# Patient Record
Sex: Female | Born: 1935 | Race: White | Hispanic: No | Smoking: Never smoker
Health system: Southern US, Community
[De-identification: ages and names within clinical notes are randomized; demographics above are authoritative.]

## PROBLEM LIST (undated history)

## (undated) DIAGNOSIS — D509 Iron deficiency anemia, unspecified: Secondary | ICD-10-CM

## (undated) DIAGNOSIS — H409 Unspecified glaucoma: Secondary | ICD-10-CM

## (undated) DIAGNOSIS — K5730 Diverticulosis of large intestine without perforation or abscess without bleeding: Secondary | ICD-10-CM

## (undated) DIAGNOSIS — N816 Rectocele: Secondary | ICD-10-CM

## (undated) DIAGNOSIS — C9110 Chronic lymphocytic leukemia of B-cell type not having achieved remission: Secondary | ICD-10-CM

## (undated) DIAGNOSIS — C50919 Malignant neoplasm of unspecified site of unspecified female breast: Secondary | ICD-10-CM

## (undated) DIAGNOSIS — K219 Gastro-esophageal reflux disease without esophagitis: Secondary | ICD-10-CM

## (undated) DIAGNOSIS — Z853 Personal history of malignant neoplasm of breast: Secondary | ICD-10-CM

## (undated) DIAGNOSIS — E785 Hyperlipidemia, unspecified: Secondary | ICD-10-CM

## (undated) DIAGNOSIS — F329 Major depressive disorder, single episode, unspecified: Secondary | ICD-10-CM

## (undated) DIAGNOSIS — N318 Other neuromuscular dysfunction of bladder: Secondary | ICD-10-CM

## (undated) DIAGNOSIS — G4733 Obstructive sleep apnea (adult) (pediatric): Secondary | ICD-10-CM

## (undated) DIAGNOSIS — J479 Bronchiectasis, uncomplicated: Secondary | ICD-10-CM

## (undated) DIAGNOSIS — F0390 Unspecified dementia without behavioral disturbance: Secondary | ICD-10-CM

## (undated) DIAGNOSIS — N390 Urinary tract infection, site not specified: Secondary | ICD-10-CM

## (undated) DIAGNOSIS — M810 Age-related osteoporosis without current pathological fracture: Secondary | ICD-10-CM

## (undated) HISTORY — DX: Personal history of malignant neoplasm of breast: Z85.3

## (undated) HISTORY — DX: Urinary tract infection, site not specified: N39.0

## (undated) HISTORY — DX: Malignant neoplasm of unspecified site of unspecified female breast: C50.919

## (undated) HISTORY — PX: OTHER SURGICAL HISTORY: SHX169

## (undated) HISTORY — DX: Chronic lymphocytic leukemia of B-cell type not having achieved remission: C91.10

## (undated) HISTORY — DX: Hyperlipidemia, unspecified: E78.5

## (undated) HISTORY — DX: Diverticulosis of large intestine without perforation or abscess without bleeding: K57.30

## (undated) HISTORY — PX: ABDOMINAL HYSTERECTOMY: SHX81

## (undated) HISTORY — DX: Unspecified dementia without behavioral disturbance: F03.90

## (undated) HISTORY — DX: Rectocele: N81.6

## (undated) HISTORY — PX: BREAST SURGERY: SHX581

## (undated) HISTORY — DX: Iron deficiency anemia, unspecified: D50.9

## (undated) HISTORY — DX: Major depressive disorder, single episode, unspecified: F32.9

## (undated) HISTORY — DX: Unspecified glaucoma: H40.9

## (undated) HISTORY — DX: Gastro-esophageal reflux disease without esophagitis: K21.9

## (undated) HISTORY — DX: Age-related osteoporosis without current pathological fracture: M81.0

## (undated) HISTORY — DX: Bronchiectasis, uncomplicated: J47.9

## (undated) HISTORY — DX: Other neuromuscular dysfunction of bladder: N31.8

## (undated) HISTORY — DX: Obstructive sleep apnea (adult) (pediatric): G47.33

---

## 1997-09-03 ENCOUNTER — Ambulatory Visit (HOSPITAL_COMMUNITY): Admission: RE | Admit: 1997-09-03 | Discharge: 1997-09-03 | Payer: Self-pay | Admitting: Oncology

## 1997-09-26 ENCOUNTER — Ambulatory Visit (HOSPITAL_COMMUNITY): Admission: RE | Admit: 1997-09-26 | Discharge: 1997-09-26 | Payer: Self-pay | Admitting: Oncology

## 1997-10-23 ENCOUNTER — Ambulatory Visit (HOSPITAL_COMMUNITY): Admission: RE | Admit: 1997-10-23 | Discharge: 1997-10-23 | Payer: Self-pay | Admitting: Oncology

## 1998-09-22 ENCOUNTER — Ambulatory Visit (HOSPITAL_COMMUNITY): Admission: RE | Admit: 1998-09-22 | Discharge: 1998-09-22 | Payer: Self-pay | Admitting: Oncology

## 1998-09-22 ENCOUNTER — Encounter: Payer: Self-pay | Admitting: Oncology

## 1998-11-03 ENCOUNTER — Encounter: Payer: Self-pay | Admitting: Oncology

## 1998-11-03 ENCOUNTER — Ambulatory Visit (HOSPITAL_COMMUNITY): Admission: RE | Admit: 1998-11-03 | Discharge: 1998-11-03 | Payer: Self-pay | Admitting: Oncology

## 1998-12-24 ENCOUNTER — Ambulatory Visit (HOSPITAL_COMMUNITY): Admission: RE | Admit: 1998-12-24 | Discharge: 1998-12-24 | Payer: Self-pay | Admitting: Oncology

## 1999-07-08 ENCOUNTER — Encounter: Admission: RE | Admit: 1999-07-08 | Discharge: 1999-07-08 | Payer: Self-pay

## 1999-07-26 ENCOUNTER — Encounter: Admission: RE | Admit: 1999-07-26 | Discharge: 1999-07-26 | Payer: Self-pay | Admitting: Oncology

## 1999-07-26 ENCOUNTER — Encounter: Payer: Self-pay | Admitting: Oncology

## 1999-09-01 ENCOUNTER — Encounter: Payer: Self-pay | Admitting: Oncology

## 1999-09-01 ENCOUNTER — Ambulatory Visit (HOSPITAL_COMMUNITY): Admission: RE | Admit: 1999-09-01 | Discharge: 1999-09-01 | Payer: Self-pay | Admitting: Oncology

## 1999-11-16 ENCOUNTER — Encounter: Admission: RE | Admit: 1999-11-16 | Discharge: 1999-11-16 | Payer: Self-pay | Admitting: Oncology

## 1999-11-16 ENCOUNTER — Encounter: Payer: Self-pay | Admitting: Oncology

## 1999-12-09 ENCOUNTER — Ambulatory Visit (HOSPITAL_COMMUNITY): Admission: RE | Admit: 1999-12-09 | Discharge: 1999-12-09 | Payer: Self-pay | Admitting: Oncology

## 1999-12-09 ENCOUNTER — Encounter (INDEPENDENT_AMBULATORY_CARE_PROVIDER_SITE_OTHER): Payer: Self-pay | Admitting: Specialist

## 2000-09-05 ENCOUNTER — Encounter: Admission: RE | Admit: 2000-09-05 | Discharge: 2000-09-05 | Payer: Self-pay | Admitting: Oncology

## 2000-09-05 ENCOUNTER — Encounter: Payer: Self-pay | Admitting: Oncology

## 2000-12-19 ENCOUNTER — Encounter: Admission: RE | Admit: 2000-12-19 | Discharge: 2000-12-19 | Payer: Self-pay | Admitting: Oncology

## 2000-12-19 ENCOUNTER — Encounter: Payer: Self-pay | Admitting: Oncology

## 2000-12-21 ENCOUNTER — Ambulatory Visit (HOSPITAL_COMMUNITY): Admission: RE | Admit: 2000-12-21 | Discharge: 2000-12-21 | Payer: Self-pay | Admitting: Oncology

## 2000-12-21 ENCOUNTER — Encounter (INDEPENDENT_AMBULATORY_CARE_PROVIDER_SITE_OTHER): Payer: Self-pay | Admitting: Specialist

## 2001-05-31 ENCOUNTER — Encounter: Payer: Self-pay | Admitting: Internal Medicine

## 2001-09-10 ENCOUNTER — Encounter: Admission: RE | Admit: 2001-09-10 | Discharge: 2001-09-10 | Payer: Self-pay | Admitting: Oncology

## 2001-09-10 ENCOUNTER — Encounter: Payer: Self-pay | Admitting: Oncology

## 2001-12-27 ENCOUNTER — Encounter: Payer: Self-pay | Admitting: Oncology

## 2001-12-27 ENCOUNTER — Encounter: Admission: RE | Admit: 2001-12-27 | Discharge: 2001-12-27 | Payer: Self-pay | Admitting: Oncology

## 2001-12-31 ENCOUNTER — Ambulatory Visit (HOSPITAL_COMMUNITY): Admission: RE | Admit: 2001-12-31 | Discharge: 2001-12-31 | Payer: Self-pay | Admitting: Oncology

## 2001-12-31 ENCOUNTER — Encounter: Payer: Self-pay | Admitting: Oncology

## 2002-01-03 ENCOUNTER — Encounter (INDEPENDENT_AMBULATORY_CARE_PROVIDER_SITE_OTHER): Payer: Self-pay | Admitting: Specialist

## 2002-01-03 ENCOUNTER — Ambulatory Visit (HOSPITAL_COMMUNITY): Admission: RE | Admit: 2002-01-03 | Discharge: 2002-01-03 | Payer: Self-pay | Admitting: Oncology

## 2002-09-16 ENCOUNTER — Encounter: Admission: RE | Admit: 2002-09-16 | Discharge: 2002-09-16 | Payer: Self-pay | Admitting: Oncology

## 2002-09-16 ENCOUNTER — Encounter: Payer: Self-pay | Admitting: Oncology

## 2002-12-31 ENCOUNTER — Encounter: Admission: RE | Admit: 2002-12-31 | Discharge: 2002-12-31 | Payer: Self-pay | Admitting: Oncology

## 2002-12-31 ENCOUNTER — Encounter: Payer: Self-pay | Admitting: Oncology

## 2003-09-29 ENCOUNTER — Encounter: Admission: RE | Admit: 2003-09-29 | Discharge: 2003-09-29 | Payer: Self-pay | Admitting: Oncology

## 2003-12-30 ENCOUNTER — Ambulatory Visit (HOSPITAL_COMMUNITY): Admission: RE | Admit: 2003-12-30 | Discharge: 2003-12-30 | Payer: Self-pay | Admitting: Oncology

## 2004-01-31 ENCOUNTER — Emergency Department (HOSPITAL_COMMUNITY): Admission: EM | Admit: 2004-01-31 | Discharge: 2004-01-31 | Payer: Self-pay | Admitting: Emergency Medicine

## 2004-04-08 ENCOUNTER — Ambulatory Visit: Payer: Self-pay | Admitting: Internal Medicine

## 2004-05-31 ENCOUNTER — Ambulatory Visit: Payer: Self-pay | Admitting: Family Medicine

## 2004-06-04 ENCOUNTER — Ambulatory Visit: Payer: Self-pay | Admitting: Internal Medicine

## 2004-06-09 ENCOUNTER — Encounter: Admission: RE | Admit: 2004-06-09 | Discharge: 2004-06-22 | Payer: Self-pay | Admitting: Orthopaedic Surgery

## 2004-06-25 ENCOUNTER — Ambulatory Visit: Payer: Self-pay | Admitting: Internal Medicine

## 2004-06-28 ENCOUNTER — Ambulatory Visit: Payer: Self-pay | Admitting: Oncology

## 2004-07-01 ENCOUNTER — Ambulatory Visit: Payer: Self-pay | Admitting: Licensed Clinical Social Worker

## 2004-07-09 ENCOUNTER — Ambulatory Visit: Payer: Self-pay | Admitting: Licensed Clinical Social Worker

## 2004-09-29 ENCOUNTER — Encounter: Admission: RE | Admit: 2004-09-29 | Discharge: 2004-09-29 | Payer: Self-pay | Admitting: Oncology

## 2004-10-21 ENCOUNTER — Ambulatory Visit: Payer: Self-pay | Admitting: Internal Medicine

## 2004-10-29 ENCOUNTER — Ambulatory Visit: Payer: Self-pay | Admitting: Internal Medicine

## 2004-12-27 ENCOUNTER — Ambulatory Visit: Payer: Self-pay | Admitting: Oncology

## 2005-03-09 ENCOUNTER — Ambulatory Visit: Payer: Self-pay | Admitting: Internal Medicine

## 2005-03-09 ENCOUNTER — Inpatient Hospital Stay (HOSPITAL_COMMUNITY): Admission: AD | Admit: 2005-03-09 | Discharge: 2005-03-11 | Payer: Self-pay | Admitting: Internal Medicine

## 2005-03-09 ENCOUNTER — Ambulatory Visit: Payer: Self-pay | Admitting: Gastroenterology

## 2005-03-10 ENCOUNTER — Ambulatory Visit: Payer: Self-pay | Admitting: Internal Medicine

## 2005-03-11 ENCOUNTER — Encounter: Payer: Self-pay | Admitting: Gastroenterology

## 2005-03-14 ENCOUNTER — Encounter: Payer: Self-pay | Admitting: Gastroenterology

## 2005-03-14 ENCOUNTER — Ambulatory Visit (HOSPITAL_COMMUNITY): Admission: RE | Admit: 2005-03-14 | Discharge: 2005-03-14 | Payer: Self-pay | Admitting: Internal Medicine

## 2005-03-17 ENCOUNTER — Ambulatory Visit: Payer: Self-pay | Admitting: Internal Medicine

## 2005-03-28 ENCOUNTER — Ambulatory Visit: Payer: Self-pay | Admitting: Internal Medicine

## 2005-04-25 ENCOUNTER — Encounter: Admission: RE | Admit: 2005-04-25 | Discharge: 2005-04-25 | Payer: Self-pay | Admitting: Orthopaedic Surgery

## 2005-04-26 ENCOUNTER — Ambulatory Visit (HOSPITAL_COMMUNITY): Admission: RE | Admit: 2005-04-26 | Discharge: 2005-04-26 | Payer: Self-pay | Admitting: Orthopaedic Surgery

## 2005-04-26 ENCOUNTER — Ambulatory Visit (HOSPITAL_BASED_OUTPATIENT_CLINIC_OR_DEPARTMENT_OTHER): Admission: RE | Admit: 2005-04-26 | Discharge: 2005-04-26 | Payer: Self-pay | Admitting: Orthopaedic Surgery

## 2005-06-28 ENCOUNTER — Ambulatory Visit: Payer: Self-pay | Admitting: Oncology

## 2005-09-20 ENCOUNTER — Ambulatory Visit: Payer: Self-pay | Admitting: Internal Medicine

## 2005-09-26 ENCOUNTER — Encounter: Admission: RE | Admit: 2005-09-26 | Discharge: 2005-09-26 | Payer: Self-pay | Admitting: Internal Medicine

## 2005-10-07 ENCOUNTER — Encounter: Admission: RE | Admit: 2005-10-07 | Discharge: 2005-10-07 | Payer: Self-pay | Admitting: Oncology

## 2005-10-07 ENCOUNTER — Encounter (INDEPENDENT_AMBULATORY_CARE_PROVIDER_SITE_OTHER): Payer: Self-pay | Admitting: Specialist

## 2005-10-14 ENCOUNTER — Ambulatory Visit: Payer: Self-pay | Admitting: Oncology

## 2005-10-14 LAB — CBC WITH DIFFERENTIAL/PLATELET
BASO%: 0.7 % (ref 0.0–2.0)
HCT: 38 % (ref 34.8–46.6)
MCHC: 33.9 g/dL (ref 32.0–36.0)
MONO#: 0.6 10*3/uL (ref 0.1–0.9)
NEUT%: 40.1 % (ref 39.6–76.8)
RBC: 3.81 10*6/uL (ref 3.70–5.32)
WBC: 7.8 10*3/uL (ref 3.9–10.0)
lymph#: 3.9 10*3/uL — ABNORMAL HIGH (ref 0.9–3.3)

## 2005-10-14 LAB — BASIC METABOLIC PANEL
CO2: 29 mEq/L (ref 19–32)
Chloride: 101 mEq/L (ref 96–112)
Sodium: 138 mEq/L (ref 135–145)

## 2005-10-17 ENCOUNTER — Ambulatory Visit (HOSPITAL_COMMUNITY): Admission: RE | Admit: 2005-10-17 | Discharge: 2005-10-17 | Payer: Self-pay | Admitting: Oncology

## 2005-11-25 ENCOUNTER — Ambulatory Visit: Payer: Self-pay | Admitting: Internal Medicine

## 2005-12-14 ENCOUNTER — Ambulatory Visit: Payer: Self-pay | Admitting: Internal Medicine

## 2005-12-27 ENCOUNTER — Ambulatory Visit: Payer: Self-pay | Admitting: Oncology

## 2005-12-28 LAB — MORPHOLOGY

## 2005-12-28 LAB — CBC WITH DIFFERENTIAL/PLATELET
BASO%: 0.4 % (ref 0.0–2.0)
Basophils Absolute: 0 10*3/uL (ref 0.0–0.1)
EOS%: 1.2 % (ref 0.0–7.0)
HGB: 11.7 g/dL (ref 11.6–15.9)
MCH: 34.2 pg — ABNORMAL HIGH (ref 26.0–34.0)
MCHC: 34.7 g/dL (ref 32.0–36.0)
MCV: 98.7 fL (ref 81.0–101.0)
MONO%: 7.4 % (ref 0.0–13.0)
NEUT%: 42.3 % (ref 39.6–76.8)
RDW: 14.9 % — ABNORMAL HIGH (ref 11.3–14.5)
lymph#: 2.6 10*3/uL (ref 0.9–3.3)

## 2005-12-28 LAB — COMPREHENSIVE METABOLIC PANEL
BUN: 23 mg/dL (ref 6–23)
CO2: 30 mEq/L (ref 19–32)
Glucose, Bld: 96 mg/dL (ref 70–99)
Sodium: 140 mEq/L (ref 135–145)
Total Bilirubin: 0.5 mg/dL (ref 0.3–1.2)
Total Protein: 5.7 g/dL — ABNORMAL LOW (ref 6.0–8.3)

## 2005-12-28 LAB — LACTATE DEHYDROGENASE: LDH: 198 U/L (ref 94–250)

## 2006-02-22 ENCOUNTER — Ambulatory Visit: Payer: Self-pay | Admitting: Internal Medicine

## 2006-04-07 ENCOUNTER — Ambulatory Visit: Payer: Self-pay | Admitting: Oncology

## 2006-04-11 ENCOUNTER — Ambulatory Visit: Payer: Self-pay | Admitting: Internal Medicine

## 2006-04-11 LAB — CBC WITH DIFFERENTIAL/PLATELET
BASO%: 0.3 % (ref 0.0–2.0)
Eosinophils Absolute: 0.1 10*3/uL (ref 0.0–0.5)
LYMPH%: 60 % — ABNORMAL HIGH (ref 14.0–48.0)
MCHC: 34.3 g/dL (ref 32.0–36.0)
MONO#: 0.4 10*3/uL (ref 0.1–0.9)
NEUT#: 2.5 10*3/uL (ref 1.5–6.5)
RBC: 3.62 10*6/uL — ABNORMAL LOW (ref 3.70–5.32)
RDW: 15.2 % — ABNORMAL HIGH (ref 11.3–14.5)
WBC: 7.6 10*3/uL (ref 3.9–10.0)
lymph#: 4.6 10*3/uL — ABNORMAL HIGH (ref 0.9–3.3)

## 2006-04-11 LAB — COMPREHENSIVE METABOLIC PANEL
ALT: 15 U/L (ref 0–35)
AST: 22 U/L (ref 0–37)
CO2: 31 mEq/L (ref 19–32)
Calcium: 9 mg/dL (ref 8.4–10.5)
Chloride: 100 mEq/L (ref 96–112)
Creatinine, Ser: 0.68 mg/dL (ref 0.40–1.20)
Potassium: 4.2 mEq/L (ref 3.5–5.3)
Sodium: 135 mEq/L (ref 135–145)
Total Protein: 6 g/dL (ref 6.0–8.3)

## 2006-04-11 LAB — MORPHOLOGY: PLT EST: ADEQUATE

## 2006-04-11 LAB — LACTATE DEHYDROGENASE: LDH: 202 U/L (ref 94–250)

## 2006-04-13 ENCOUNTER — Ambulatory Visit (HOSPITAL_COMMUNITY): Admission: RE | Admit: 2006-04-13 | Discharge: 2006-04-13 | Payer: Self-pay | Admitting: Oncology

## 2006-05-26 ENCOUNTER — Ambulatory Visit: Payer: Self-pay | Admitting: Internal Medicine

## 2006-06-26 ENCOUNTER — Ambulatory Visit: Payer: Self-pay | Admitting: Internal Medicine

## 2006-07-17 ENCOUNTER — Ambulatory Visit: Payer: Self-pay | Admitting: Internal Medicine

## 2006-07-17 LAB — CONVERTED CEMR LAB
AST: 29 units/L (ref 0–37)
Basophils Relative: 0.5 % (ref 0.0–1.0)
Chloride: 102 meq/L (ref 96–112)
Cholesterol: 206 mg/dL (ref 0–200)
Creatinine, Ser: 0.7 mg/dL (ref 0.4–1.2)
Direct LDL: 127.9 mg/dL
Eosinophils Absolute: 0.1 10*3/uL (ref 0.0–0.6)
Folate: 14 ng/mL
GFR calc non Af Amer: 85 mL/min
Glucose, Bld: 104 mg/dL — ABNORMAL HIGH (ref 70–99)
HDL: 68.3 mg/dL (ref >39.0)
Hemoglobin: 12.9 g/dL (ref 12.0–15.0)
Lymphocytes Relative: 63.8 % — ABNORMAL HIGH (ref 12.0–46.0)
MCV: 96.9 fL (ref 78.0–100.0)
Monocytes Absolute: 0.5 10*3/uL (ref 0.2–0.7)
Monocytes Relative: 6.8 % (ref 3.0–11.0)
Neutro Abs: 2 10*3/uL (ref 1.4–7.7)
Platelets: 145 10*3/uL — ABNORMAL LOW (ref 150–400)
Potassium: 4.6 meq/L (ref 3.5–5.1)
Sodium: 141 meq/L (ref 135–145)
VLDL: 15 mg/dL (ref 0–40)
Vitamin B-12: 359 pg/mL (ref 211–911)

## 2006-08-02 ENCOUNTER — Ambulatory Visit: Payer: Self-pay | Admitting: Internal Medicine

## 2006-09-05 ENCOUNTER — Ambulatory Visit: Payer: Self-pay | Admitting: Internal Medicine

## 2006-09-05 LAB — CONVERTED CEMR LAB
Ketones, ur: NEGATIVE mg/dL
Specific Gravity, Urine: 1.01 (ref 1.000–1.03)
Urine Glucose: NEGATIVE mg/dL
Urobilinogen, UA: 0.2 (ref 0.0–1.0)
pH: 7.5 (ref 5.0–8.0)

## 2006-10-10 ENCOUNTER — Ambulatory Visit: Payer: Self-pay | Admitting: Internal Medicine

## 2006-10-10 ENCOUNTER — Encounter: Admission: RE | Admit: 2006-10-10 | Discharge: 2006-10-10 | Payer: Self-pay | Admitting: Oncology

## 2006-10-10 ENCOUNTER — Ambulatory Visit: Payer: Self-pay | Admitting: Oncology

## 2006-10-10 LAB — COMPREHENSIVE METABOLIC PANEL
ALT: 20 U/L (ref 0–35)
CO2: 30 mEq/L (ref 19–32)
Calcium: 8.8 mg/dL (ref 8.4–10.5)
Chloride: 101 mEq/L (ref 96–112)
Sodium: 139 mEq/L (ref 135–145)
Total Protein: 6 g/dL (ref 6.0–8.3)

## 2006-10-10 LAB — CBC WITH DIFFERENTIAL/PLATELET
Basophils Absolute: 0 10*3/uL (ref 0.0–0.1)
EOS%: 1.4 % (ref 0.0–7.0)
HCT: 35.4 % (ref 34.8–46.6)
HGB: 12.1 g/dL (ref 11.6–15.9)
MCH: 33.1 pg (ref 26.0–34.0)
MCV: 96.9 fL (ref 81.0–101.0)
MONO%: 6.5 % (ref 0.0–13.0)
NEUT%: 36.5 % — ABNORMAL LOW (ref 39.6–76.8)

## 2006-10-10 LAB — LACTATE DEHYDROGENASE: LDH: 195 U/L (ref 94–250)

## 2006-10-19 ENCOUNTER — Ambulatory Visit: Admission: RE | Admit: 2006-10-19 | Discharge: 2006-10-19 | Payer: Self-pay | Admitting: Oncology

## 2006-10-30 ENCOUNTER — Ambulatory Visit: Payer: Self-pay | Admitting: Internal Medicine

## 2006-12-13 ENCOUNTER — Ambulatory Visit: Payer: Self-pay | Admitting: Internal Medicine

## 2006-12-19 ENCOUNTER — Ambulatory Visit: Payer: Self-pay | Admitting: Internal Medicine

## 2007-01-19 ENCOUNTER — Ambulatory Visit: Payer: Self-pay | Admitting: Oncology

## 2007-01-22 LAB — CBC WITH DIFFERENTIAL/PLATELET
BASO%: 0.2 % (ref 0.0–2.0)
EOS%: 0.7 % (ref 0.0–7.0)
MCH: 33.9 pg (ref 26.0–34.0)
MCHC: 35.1 g/dL (ref 32.0–36.0)
RBC: 3.7 10*6/uL (ref 3.70–5.32)
RDW: 15.6 % — ABNORMAL HIGH (ref 11.3–14.5)
lymph#: 4.5 10*3/uL — ABNORMAL HIGH (ref 0.9–3.3)

## 2007-01-22 LAB — COMPREHENSIVE METABOLIC PANEL
AST: 25 U/L (ref 0–37)
BUN: 16 mg/dL (ref 6–23)
Calcium: 9.1 mg/dL (ref 8.4–10.5)
Chloride: 100 mEq/L (ref 96–112)
Creatinine, Ser: 0.63 mg/dL (ref 0.40–1.20)

## 2007-01-22 LAB — MORPHOLOGY

## 2007-01-22 LAB — LACTATE DEHYDROGENASE: LDH: 179 U/L (ref 94–250)

## 2007-04-09 ENCOUNTER — Telehealth (INDEPENDENT_AMBULATORY_CARE_PROVIDER_SITE_OTHER): Payer: Self-pay | Admitting: *Deleted

## 2007-04-09 ENCOUNTER — Telehealth: Payer: Self-pay | Admitting: Internal Medicine

## 2007-04-09 ENCOUNTER — Ambulatory Visit: Payer: Self-pay | Admitting: Internal Medicine

## 2007-04-09 DIAGNOSIS — N318 Other neuromuscular dysfunction of bladder: Secondary | ICD-10-CM

## 2007-04-09 DIAGNOSIS — H409 Unspecified glaucoma: Secondary | ICD-10-CM

## 2007-04-09 DIAGNOSIS — E785 Hyperlipidemia, unspecified: Secondary | ICD-10-CM

## 2007-04-09 DIAGNOSIS — N390 Urinary tract infection, site not specified: Secondary | ICD-10-CM

## 2007-04-09 DIAGNOSIS — D509 Iron deficiency anemia, unspecified: Secondary | ICD-10-CM

## 2007-04-09 DIAGNOSIS — M25519 Pain in unspecified shoulder: Secondary | ICD-10-CM

## 2007-04-09 DIAGNOSIS — Z8719 Personal history of other diseases of the digestive system: Secondary | ICD-10-CM | POA: Insufficient documentation

## 2007-04-09 HISTORY — DX: Other neuromuscular dysfunction of bladder: N31.8

## 2007-04-09 HISTORY — DX: Iron deficiency anemia, unspecified: D50.9

## 2007-04-09 HISTORY — DX: Unspecified glaucoma: H40.9

## 2007-04-09 HISTORY — DX: Hyperlipidemia, unspecified: E78.5

## 2007-04-09 LAB — CONVERTED CEMR LAB
Glucose, Urine, Semiquant: NEGATIVE
Protein, U semiquant: NEGATIVE
Specific Gravity, Urine: 1.015
pH: 6.5

## 2007-04-18 ENCOUNTER — Encounter: Payer: Self-pay | Admitting: Internal Medicine

## 2007-04-19 ENCOUNTER — Ambulatory Visit: Payer: Self-pay | Admitting: Oncology

## 2007-04-24 LAB — COMPREHENSIVE METABOLIC PANEL
Albumin: 4.1 g/dL (ref 3.5–5.2)
BUN: 24 mg/dL — ABNORMAL HIGH (ref 6–23)
CO2: 28 mEq/L (ref 19–32)
Calcium: 8.9 mg/dL (ref 8.4–10.5)
Chloride: 101 mEq/L (ref 96–112)
Glucose, Bld: 113 mg/dL — ABNORMAL HIGH (ref 70–99)
Potassium: 3.9 mEq/L (ref 3.5–5.3)

## 2007-04-24 LAB — CBC WITH DIFFERENTIAL/PLATELET
BASO%: 0.3 % (ref 0.0–2.0)
EOS%: 0.5 % (ref 0.0–7.0)
HCT: 36.7 % (ref 34.8–46.6)
LYMPH%: 69.1 % — ABNORMAL HIGH (ref 14.0–48.0)
MCH: 33.9 pg (ref 26.0–34.0)
MCHC: 34.3 g/dL (ref 32.0–36.0)
MONO%: 4.3 % (ref 0.0–13.0)
NEUT%: 25.8 % — ABNORMAL LOW (ref 39.6–76.8)
Platelets: 152 10*3/uL (ref 145–400)
lymph#: 9.4 10*3/uL — ABNORMAL HIGH (ref 0.9–3.3)

## 2007-05-08 ENCOUNTER — Ambulatory Visit (HOSPITAL_COMMUNITY): Admission: RE | Admit: 2007-05-08 | Discharge: 2007-05-08 | Payer: Self-pay | Admitting: Oncology

## 2007-05-08 ENCOUNTER — Encounter (INDEPENDENT_AMBULATORY_CARE_PROVIDER_SITE_OTHER): Payer: Self-pay | Admitting: *Deleted

## 2007-05-14 ENCOUNTER — Encounter: Payer: Self-pay | Admitting: Internal Medicine

## 2007-06-08 ENCOUNTER — Encounter: Payer: Self-pay | Admitting: Internal Medicine

## 2007-06-18 ENCOUNTER — Ambulatory Visit: Payer: Self-pay | Admitting: Internal Medicine

## 2007-06-22 ENCOUNTER — Telehealth: Payer: Self-pay | Admitting: Internal Medicine

## 2007-06-29 ENCOUNTER — Ambulatory Visit: Payer: Self-pay | Admitting: Internal Medicine

## 2007-06-29 DIAGNOSIS — M549 Dorsalgia, unspecified: Secondary | ICD-10-CM | POA: Insufficient documentation

## 2007-07-02 ENCOUNTER — Telehealth: Payer: Self-pay | Admitting: Internal Medicine

## 2007-07-10 ENCOUNTER — Ambulatory Visit: Payer: Self-pay | Admitting: Internal Medicine

## 2007-07-10 DIAGNOSIS — R059 Cough, unspecified: Secondary | ICD-10-CM | POA: Insufficient documentation

## 2007-07-10 DIAGNOSIS — R05 Cough: Secondary | ICD-10-CM

## 2007-07-20 ENCOUNTER — Ambulatory Visit: Payer: Self-pay | Admitting: Internal Medicine

## 2007-08-01 ENCOUNTER — Encounter: Payer: Self-pay | Admitting: Internal Medicine

## 2007-08-09 ENCOUNTER — Encounter: Payer: Self-pay | Admitting: Internal Medicine

## 2007-09-20 ENCOUNTER — Ambulatory Visit: Payer: Self-pay | Admitting: Internal Medicine

## 2007-09-20 DIAGNOSIS — R5383 Other fatigue: Secondary | ICD-10-CM

## 2007-09-20 DIAGNOSIS — J189 Pneumonia, unspecified organism: Secondary | ICD-10-CM

## 2007-09-20 DIAGNOSIS — R413 Other amnesia: Secondary | ICD-10-CM | POA: Insufficient documentation

## 2007-09-20 DIAGNOSIS — R5381 Other malaise: Secondary | ICD-10-CM

## 2007-09-22 ENCOUNTER — Encounter: Payer: Self-pay | Admitting: Internal Medicine

## 2007-09-22 DIAGNOSIS — D7289 Other specified disorders of white blood cells: Secondary | ICD-10-CM

## 2007-09-22 LAB — CONVERTED CEMR LAB
Basophils Relative: 0.5 % (ref 0.0–1.0)
CO2: 37 meq/L — ABNORMAL HIGH (ref 19–32)
Calcium: 9.6 mg/dL (ref 8.4–10.5)
Direct LDL: 116.5 mg/dL
Eosinophils Absolute: 0.1 10*3/uL (ref 0.0–0.7)
Eosinophils Relative: 1.2 % (ref 0.0–5.0)
Glucose, Bld: 104 mg/dL — ABNORMAL HIGH (ref 70–99)
HDL: 85.7 mg/dL (ref >39.0)
Hemoglobin: 12.5 g/dL (ref 12.0–15.0)
MCV: 98.9 fL (ref 78.0–100.0)
Neutrophils Relative %: 24.6 % — ABNORMAL LOW (ref 43.0–77.0)
Potassium: 4.5 meq/L (ref 3.5–5.1)
RBC: 3.77 M/uL — ABNORMAL LOW (ref 3.87–5.11)
Sodium: 140 meq/L (ref 135–145)
TSH: 4.51 microintl units/mL (ref 0.35–5.50)
WBC: 11.3 10*3/uL — ABNORMAL HIGH (ref 4.5–10.5)

## 2007-09-26 ENCOUNTER — Telehealth: Payer: Self-pay | Admitting: Internal Medicine

## 2007-09-28 ENCOUNTER — Ambulatory Visit: Payer: Self-pay | Admitting: Internal Medicine

## 2007-09-28 LAB — CONVERTED CEMR LAB
Basophils Relative: 0.1 % (ref 0.0–1.0)
Hemoglobin: 12.1 g/dL (ref 12.0–15.0)
Lymphocytes Relative: 64.4 % — ABNORMAL HIGH (ref 12.0–46.0)
Monocytes Relative: 5.3 % (ref 3.0–12.0)
Neutro Abs: 2.9 10*3/uL (ref 1.4–7.7)
RBC: 3.64 M/uL — ABNORMAL LOW (ref 3.87–5.11)

## 2007-10-04 ENCOUNTER — Telehealth: Payer: Self-pay | Admitting: Internal Medicine

## 2007-10-11 ENCOUNTER — Encounter: Admission: RE | Admit: 2007-10-11 | Discharge: 2007-10-11 | Payer: Self-pay | Admitting: Oncology

## 2007-10-17 ENCOUNTER — Ambulatory Visit: Payer: Self-pay | Admitting: Internal Medicine

## 2007-11-01 ENCOUNTER — Ambulatory Visit: Payer: Self-pay | Admitting: Internal Medicine

## 2007-11-02 ENCOUNTER — Ambulatory Visit: Payer: Self-pay | Admitting: Oncology

## 2007-11-06 ENCOUNTER — Encounter: Payer: Self-pay | Admitting: Internal Medicine

## 2007-11-06 LAB — MORPHOLOGY

## 2007-11-06 LAB — COMPREHENSIVE METABOLIC PANEL
Albumin: 3.6 g/dL (ref 3.5–5.2)
BUN: 17 mg/dL (ref 6–23)
CO2: 26 mEq/L (ref 19–32)
Calcium: 8.8 mg/dL (ref 8.4–10.5)
Glucose, Bld: 124 mg/dL — ABNORMAL HIGH (ref 70–99)
Potassium: 4.3 mEq/L (ref 3.5–5.3)
Sodium: 139 mEq/L (ref 135–145)
Total Protein: 5.7 g/dL — ABNORMAL LOW (ref 6.0–8.3)

## 2007-11-06 LAB — CBC WITH DIFFERENTIAL/PLATELET
Basophils Absolute: 0 10*3/uL (ref 0.0–0.1)
EOS%: 1.4 % (ref 0.0–7.0)
Eosinophils Absolute: 0.1 10*3/uL (ref 0.0–0.5)
HGB: 11.5 g/dL — ABNORMAL LOW (ref 11.6–15.9)
LYMPH%: 67.3 % — ABNORMAL HIGH (ref 14.0–48.0)
MCH: 33.3 pg (ref 26.0–34.0)
MCV: 95.8 fL (ref 81.0–101.0)
MONO%: 4.9 % (ref 0.0–13.0)
NEUT#: 2.6 10*3/uL (ref 1.5–6.5)
Platelets: 149 10*3/uL (ref 145–400)
RBC: 3.44 10*6/uL — ABNORMAL LOW (ref 3.70–5.32)
RDW: 15.1 % — ABNORMAL HIGH (ref 11.3–14.5)

## 2007-11-06 LAB — LACTATE DEHYDROGENASE: LDH: 162 U/L (ref 94–250)

## 2007-11-13 ENCOUNTER — Encounter: Payer: Self-pay | Admitting: Internal Medicine

## 2007-11-13 ENCOUNTER — Ambulatory Visit (HOSPITAL_COMMUNITY): Admission: RE | Admit: 2007-11-13 | Discharge: 2007-11-13 | Payer: Self-pay | Admitting: Oncology

## 2008-01-04 ENCOUNTER — Encounter: Payer: Self-pay | Admitting: Internal Medicine

## 2008-02-07 ENCOUNTER — Ambulatory Visit: Payer: Self-pay | Admitting: Oncology

## 2008-02-12 LAB — MORPHOLOGY

## 2008-02-12 LAB — CBC WITH DIFFERENTIAL/PLATELET
Basophils Absolute: 0 10*3/uL (ref 0.0–0.1)
EOS%: 1.6 % (ref 0.0–7.0)
MCH: 34.4 pg — ABNORMAL HIGH (ref 26.0–34.0)
MCHC: 34.7 g/dL (ref 32.0–36.0)
MCV: 99.3 fL (ref 81.0–101.0)
MONO%: 5.5 % (ref 0.0–13.0)
RBC: 3.4 10*6/uL — ABNORMAL LOW (ref 3.70–5.32)
RDW: 15.8 % — ABNORMAL HIGH (ref 11.3–14.5)

## 2008-02-26 ENCOUNTER — Ambulatory Visit: Payer: Self-pay | Admitting: Internal Medicine

## 2008-02-26 DIAGNOSIS — K219 Gastro-esophageal reflux disease without esophagitis: Secondary | ICD-10-CM

## 2008-02-26 HISTORY — DX: Gastro-esophageal reflux disease without esophagitis: K21.9

## 2008-03-05 ENCOUNTER — Ambulatory Visit: Payer: Self-pay | Admitting: Pulmonary Disease

## 2008-03-10 ENCOUNTER — Encounter (INDEPENDENT_AMBULATORY_CARE_PROVIDER_SITE_OTHER): Payer: Self-pay | Admitting: *Deleted

## 2008-03-26 ENCOUNTER — Ambulatory Visit (HOSPITAL_BASED_OUTPATIENT_CLINIC_OR_DEPARTMENT_OTHER): Admission: RE | Admit: 2008-03-26 | Discharge: 2008-03-26 | Payer: Self-pay | Admitting: Pulmonary Disease

## 2008-03-26 ENCOUNTER — Encounter: Payer: Self-pay | Admitting: Pulmonary Disease

## 2008-03-31 ENCOUNTER — Telehealth (INDEPENDENT_AMBULATORY_CARE_PROVIDER_SITE_OTHER): Payer: Self-pay | Admitting: *Deleted

## 2008-04-03 ENCOUNTER — Ambulatory Visit: Payer: Self-pay | Admitting: Pulmonary Disease

## 2008-04-10 ENCOUNTER — Telehealth (INDEPENDENT_AMBULATORY_CARE_PROVIDER_SITE_OTHER): Payer: Self-pay | Admitting: *Deleted

## 2008-04-22 ENCOUNTER — Ambulatory Visit: Payer: Self-pay | Admitting: Pulmonary Disease

## 2008-04-22 DIAGNOSIS — G4733 Obstructive sleep apnea (adult) (pediatric): Secondary | ICD-10-CM

## 2008-04-22 HISTORY — DX: Obstructive sleep apnea (adult) (pediatric): G47.33

## 2008-05-02 ENCOUNTER — Ambulatory Visit: Payer: Self-pay | Admitting: Oncology

## 2008-05-06 LAB — MORPHOLOGY: PLT EST: DECREASED

## 2008-05-06 LAB — CBC WITH DIFFERENTIAL/PLATELET
Eosinophils Absolute: 0.5 10*3/uL (ref 0.0–0.5)
HCT: 32 % — ABNORMAL LOW (ref 34.8–46.6)
LYMPH%: 81.9 % — ABNORMAL HIGH (ref 14.0–48.0)
MCHC: 33.9 g/dL (ref 32.0–36.0)
MCV: 100.6 fL (ref 81.0–101.0)
MONO#: 0.7 10*3/uL (ref 0.1–0.9)
NEUT#: 2.2 10*3/uL (ref 1.5–6.5)
NEUT%: 11.8 % — ABNORMAL LOW (ref 39.6–76.8)
Platelets: 129 10*3/uL — ABNORMAL LOW (ref 145–400)
WBC: 19 10*3/uL — ABNORMAL HIGH (ref 3.9–10.0)

## 2008-05-06 LAB — COMPREHENSIVE METABOLIC PANEL
ALT: 17 U/L (ref 0–35)
AST: 22 U/L (ref 0–37)
Albumin: 3.9 g/dL (ref 3.5–5.2)
Calcium: 9 mg/dL (ref 8.4–10.5)
Chloride: 101 mEq/L (ref 96–112)
Potassium: 4.3 mEq/L (ref 3.5–5.3)
Total Protein: 5.8 g/dL — ABNORMAL LOW (ref 6.0–8.3)

## 2008-05-08 ENCOUNTER — Encounter (INDEPENDENT_AMBULATORY_CARE_PROVIDER_SITE_OTHER): Payer: Self-pay | Admitting: *Deleted

## 2008-05-08 ENCOUNTER — Ambulatory Visit (HOSPITAL_COMMUNITY): Admission: RE | Admit: 2008-05-08 | Discharge: 2008-05-08 | Payer: Self-pay | Admitting: Oncology

## 2008-05-08 ENCOUNTER — Ambulatory Visit: Payer: Self-pay | Admitting: Internal Medicine

## 2008-05-13 ENCOUNTER — Encounter: Payer: Self-pay | Admitting: Internal Medicine

## 2008-05-14 ENCOUNTER — Telehealth: Payer: Self-pay | Admitting: Pulmonary Disease

## 2008-05-14 ENCOUNTER — Encounter: Payer: Self-pay | Admitting: Pulmonary Disease

## 2008-06-18 ENCOUNTER — Encounter: Payer: Self-pay | Admitting: Pulmonary Disease

## 2008-06-25 ENCOUNTER — Encounter: Payer: Self-pay | Admitting: Internal Medicine

## 2008-07-02 ENCOUNTER — Encounter: Payer: Self-pay | Admitting: Pulmonary Disease

## 2008-07-22 ENCOUNTER — Ambulatory Visit: Payer: Self-pay | Admitting: Pulmonary Disease

## 2008-07-22 DIAGNOSIS — J00 Acute nasopharyngitis [common cold]: Secondary | ICD-10-CM

## 2008-08-03 ENCOUNTER — Ambulatory Visit (HOSPITAL_BASED_OUTPATIENT_CLINIC_OR_DEPARTMENT_OTHER): Admission: RE | Admit: 2008-08-03 | Discharge: 2008-08-03 | Payer: Self-pay | Admitting: Pulmonary Disease

## 2008-08-03 ENCOUNTER — Encounter: Payer: Self-pay | Admitting: Pulmonary Disease

## 2008-08-05 ENCOUNTER — Ambulatory Visit: Payer: Self-pay | Admitting: Pulmonary Disease

## 2008-08-08 ENCOUNTER — Ambulatory Visit: Payer: Self-pay | Admitting: Oncology

## 2008-08-12 ENCOUNTER — Encounter: Payer: Self-pay | Admitting: Internal Medicine

## 2008-08-12 LAB — COMPREHENSIVE METABOLIC PANEL
Albumin: 4.2 g/dL (ref 3.5–5.2)
BUN: 29 mg/dL — ABNORMAL HIGH (ref 6–23)
CO2: 30 mEq/L (ref 19–32)
Calcium: 9.3 mg/dL (ref 8.4–10.5)
Chloride: 99 mEq/L (ref 96–112)
Creatinine, Ser: 0.69 mg/dL (ref 0.40–1.20)
Glucose, Bld: 80 mg/dL (ref 70–99)
Potassium: 4.2 mEq/L (ref 3.5–5.3)

## 2008-08-12 LAB — CBC WITH DIFFERENTIAL/PLATELET
Basophils Absolute: 0 10*3/uL (ref 0.0–0.1)
Eosinophils Absolute: 0.7 10*3/uL — ABNORMAL HIGH (ref 0.0–0.5)
HCT: 33.1 % — ABNORMAL LOW (ref 34.8–46.6)
HGB: 10.9 g/dL — ABNORMAL LOW (ref 11.6–15.9)
LYMPH%: 81 % — ABNORMAL HIGH (ref 14.0–49.7)
MCV: 97.6 fL (ref 79.5–101.0)
MONO%: 4.2 % (ref 0.0–14.0)
NEUT#: 2.4 10*3/uL (ref 1.5–6.5)
NEUT%: 11.5 % — ABNORMAL LOW (ref 38.4–76.8)
Platelets: 91 10*3/uL — ABNORMAL LOW (ref 145–400)

## 2008-08-12 LAB — LACTATE DEHYDROGENASE: LDH: 182 U/L (ref 94–250)

## 2008-08-12 LAB — MORPHOLOGY

## 2008-08-18 ENCOUNTER — Encounter: Payer: Self-pay | Admitting: Oncology

## 2008-08-18 ENCOUNTER — Other Ambulatory Visit: Admission: RE | Admit: 2008-08-18 | Discharge: 2008-08-18 | Payer: Self-pay | Admitting: Oncology

## 2008-08-18 LAB — CBC WITH DIFFERENTIAL/PLATELET
Basophils Absolute: 0.1 10*3/uL (ref 0.0–0.1)
EOS%: 2.5 % (ref 0.0–7.0)
Eosinophils Absolute: 0.5 10*3/uL (ref 0.0–0.5)
HGB: 11.1 g/dL — ABNORMAL LOW (ref 11.6–15.9)
MCV: 99.7 fL (ref 79.5–101.0)
MONO%: 3.7 % (ref 0.0–14.0)
NEUT#: 1.7 10*3/uL (ref 1.5–6.5)
RBC: 3.28 10*6/uL — ABNORMAL LOW (ref 3.70–5.45)
RDW: 16.3 % — ABNORMAL HIGH (ref 11.2–14.5)
lymph#: 16.6 10*3/uL — ABNORMAL HIGH (ref 0.9–3.3)

## 2008-08-18 LAB — TECHNOLOGIST REVIEW

## 2008-08-19 LAB — CBC WITH DIFFERENTIAL/PLATELET
BASO%: 0.5 % (ref 0.0–2.0)
EOS%: 1.4 % (ref 0.0–7.0)
HCT: 29 % — ABNORMAL LOW (ref 34.8–46.6)
MCH: 33.6 pg (ref 25.1–34.0)
MCHC: 33.9 g/dL (ref 31.5–36.0)
MONO#: 0.2 10*3/uL (ref 0.1–0.9)
RDW: 16.5 % — ABNORMAL HIGH (ref 11.2–14.5)
WBC: 6 10*3/uL (ref 3.9–10.3)
lymph#: 2.3 10*3/uL (ref 0.9–3.3)
nRBC: 0 % (ref 0–0)

## 2008-08-19 LAB — FLOW CYTOMETRY

## 2008-09-02 ENCOUNTER — Encounter: Payer: Self-pay | Admitting: Internal Medicine

## 2008-09-02 LAB — CBC WITH DIFFERENTIAL/PLATELET
Basophils Absolute: 0 10*3/uL (ref 0.0–0.1)
Eosinophils Absolute: 0.2 10*3/uL (ref 0.0–0.5)
HCT: 32 % — ABNORMAL LOW (ref 34.8–46.6)
HGB: 11 g/dL — ABNORMAL LOW (ref 11.6–15.9)
LYMPH%: 16.8 % (ref 14.0–49.7)
MCV: 97.2 fL (ref 79.5–101.0)
MONO%: 16.6 % — ABNORMAL HIGH (ref 0.0–14.0)
NEUT#: 1 10*3/uL — ABNORMAL LOW (ref 1.5–6.5)
NEUT%: 53.1 % (ref 38.4–76.8)
Platelets: 104 10*3/uL — ABNORMAL LOW (ref 145–400)
RDW: 15.5 % — ABNORMAL HIGH (ref 11.2–14.5)

## 2008-09-02 LAB — COMPREHENSIVE METABOLIC PANEL
AST: 16 U/L (ref 0–37)
Albumin: 3.9 g/dL (ref 3.5–5.2)
BUN: 22 mg/dL (ref 6–23)
CO2: 25 mEq/L (ref 19–32)
Calcium: 8.8 mg/dL (ref 8.4–10.5)
Chloride: 102 mEq/L (ref 96–112)
Potassium: 4.3 mEq/L (ref 3.5–5.3)

## 2008-09-02 LAB — MORPHOLOGY

## 2008-09-15 LAB — CBC WITH DIFFERENTIAL/PLATELET
Basophils Absolute: 0 10*3/uL (ref 0.0–0.1)
EOS%: 14.7 % — ABNORMAL HIGH (ref 0.0–7.0)
Eosinophils Absolute: 0.3 10*3/uL (ref 0.0–0.5)
LYMPH%: 24.2 % (ref 14.0–49.7)
MCH: 32.6 pg (ref 25.1–34.0)
MCV: 96 fL (ref 79.5–101.0)
MONO%: 23.7 % — ABNORMAL HIGH (ref 0.0–14.0)
NEUT#: 0.7 10*3/uL — ABNORMAL LOW (ref 1.5–6.5)
Platelets: 91 10*3/uL — ABNORMAL LOW (ref 145–400)
RBC: 3.47 10*6/uL — ABNORMAL LOW (ref 3.70–5.45)

## 2008-09-18 ENCOUNTER — Ambulatory Visit: Payer: Self-pay | Admitting: Oncology

## 2008-09-22 ENCOUNTER — Ambulatory Visit: Payer: Self-pay | Admitting: Pulmonary Disease

## 2008-09-22 LAB — CBC WITH DIFFERENTIAL/PLATELET
BASO%: 0.3 % (ref 0.0–2.0)
Basophils Absolute: 0 10e3/uL (ref 0.0–0.1)
EOS%: 3.6 % (ref 0.0–7.0)
Eosinophils Absolute: 0.3 10e3/uL (ref 0.0–0.5)
HCT: 33.6 % — ABNORMAL LOW (ref 34.8–46.6)
HGB: 11.4 g/dL — ABNORMAL LOW (ref 11.6–15.9)
LYMPH%: 3.3 % — ABNORMAL LOW (ref 14.0–49.7)
MCH: 33.7 pg (ref 25.1–34.0)
MCHC: 33.8 g/dL (ref 31.5–36.0)
MCV: 99.7 fL (ref 79.5–101.0)
MONO#: 0.5 10e3/uL (ref 0.1–0.9)
MONO%: 5.7 % (ref 0.0–14.0)
NEUT#: 7.6 10e3/uL — ABNORMAL HIGH (ref 1.5–6.5)
NEUT%: 87.1 % — ABNORMAL HIGH (ref 38.4–76.8)
Platelets: 92 10e3/uL — ABNORMAL LOW (ref 145–400)
RBC: 3.37 10e6/uL — ABNORMAL LOW (ref 3.70–5.45)
RDW: 17 % — ABNORMAL HIGH (ref 11.2–14.5)
WBC: 8.7 10e3/uL (ref 3.9–10.3)
lymph#: 0.3 10e3/uL — ABNORMAL LOW (ref 0.9–3.3)

## 2008-10-02 ENCOUNTER — Encounter: Payer: Self-pay | Admitting: Internal Medicine

## 2008-10-13 LAB — CBC WITH DIFFERENTIAL/PLATELET
Basophils Absolute: 0.1 10*3/uL (ref 0.0–0.1)
EOS%: 11.8 % — ABNORMAL HIGH (ref 0.0–7.0)
Eosinophils Absolute: 0.4 10*3/uL (ref 0.0–0.5)
HGB: 11.1 g/dL — ABNORMAL LOW (ref 11.6–15.9)
LYMPH%: 24.8 % (ref 14.0–49.7)
MCH: 32.8 pg (ref 25.1–34.0)
MCV: 97.3 fL (ref 79.5–101.0)
MONO%: 20.6 % — ABNORMAL HIGH (ref 0.0–14.0)
NEUT#: 1.3 10*3/uL — ABNORMAL LOW (ref 1.5–6.5)
Platelets: 132 10*3/uL — ABNORMAL LOW (ref 145–400)
RBC: 3.38 10*6/uL — ABNORMAL LOW (ref 3.70–5.45)

## 2008-10-13 LAB — COMPREHENSIVE METABOLIC PANEL
Alkaline Phosphatase: 61 U/L (ref 39–117)
BUN: 14 mg/dL (ref 6–23)
Creatinine, Ser: 0.52 mg/dL (ref 0.40–1.20)
Glucose, Bld: 75 mg/dL (ref 70–99)
Total Bilirubin: 0.6 mg/dL (ref 0.3–1.2)

## 2008-10-28 ENCOUNTER — Ambulatory Visit (HOSPITAL_COMMUNITY): Admission: RE | Admit: 2008-10-28 | Discharge: 2008-10-28 | Payer: Self-pay | Admitting: Oncology

## 2008-10-28 LAB — COMPREHENSIVE METABOLIC PANEL
ALT: 14 U/L (ref 0–35)
Alkaline Phosphatase: 97 U/L (ref 39–117)
Sodium: 135 mEq/L (ref 135–145)
Total Bilirubin: 0.5 mg/dL (ref 0.3–1.2)
Total Protein: 5.7 g/dL — ABNORMAL LOW (ref 6.0–8.3)

## 2008-10-28 LAB — CBC WITH DIFFERENTIAL/PLATELET
Basophils Absolute: 0.1 10*3/uL (ref 0.0–0.1)
EOS%: 8.4 % — ABNORMAL HIGH (ref 0.0–7.0)
Eosinophils Absolute: 0.6 10*3/uL — ABNORMAL HIGH (ref 0.0–0.5)
HCT: 35.9 % (ref 34.8–46.6)
HGB: 12.4 g/dL (ref 11.6–15.9)
MCH: 33.7 pg (ref 25.1–34.0)
MCV: 97.4 fL (ref 79.5–101.0)
NEUT#: 4.4 10*3/uL (ref 1.5–6.5)
NEUT%: 66.7 % (ref 38.4–76.8)
lymph#: 1 10*3/uL (ref 0.9–3.3)

## 2008-11-04 ENCOUNTER — Encounter (INDEPENDENT_AMBULATORY_CARE_PROVIDER_SITE_OTHER): Payer: Self-pay | Admitting: *Deleted

## 2008-11-04 ENCOUNTER — Ambulatory Visit (HOSPITAL_COMMUNITY): Admission: RE | Admit: 2008-11-04 | Discharge: 2008-11-04 | Payer: Self-pay | Admitting: Oncology

## 2008-11-06 ENCOUNTER — Ambulatory Visit: Payer: Self-pay | Admitting: Oncology

## 2008-11-10 LAB — COMPREHENSIVE METABOLIC PANEL
ALT: 12 U/L (ref 0–35)
BUN: 23 mg/dL (ref 6–23)
CO2: 28 mEq/L (ref 19–32)
Creatinine, Ser: 0.61 mg/dL (ref 0.40–1.20)
Total Bilirubin: 0.4 mg/dL (ref 0.3–1.2)

## 2008-11-10 LAB — CBC WITH DIFFERENTIAL/PLATELET
BASO%: 1.4 % (ref 0.0–2.0)
HCT: 34.1 % — ABNORMAL LOW (ref 34.8–46.6)
LYMPH%: 21.5 % (ref 14.0–49.7)
MCH: 31.9 pg (ref 25.1–34.0)
MCHC: 33.1 g/dL (ref 31.5–36.0)
MONO#: 0.6 10*3/uL (ref 0.1–0.9)
NEUT%: 48.3 % (ref 38.4–76.8)
Platelets: 124 10*3/uL — ABNORMAL LOW (ref 145–400)

## 2008-11-10 LAB — LACTATE DEHYDROGENASE: LDH: 151 U/L (ref 94–250)

## 2008-11-11 ENCOUNTER — Ambulatory Visit (HOSPITAL_COMMUNITY): Admission: RE | Admit: 2008-11-11 | Discharge: 2008-11-11 | Payer: Self-pay | Admitting: Oncology

## 2008-11-19 ENCOUNTER — Ambulatory Visit: Payer: Self-pay | Admitting: Oncology

## 2008-11-19 ENCOUNTER — Inpatient Hospital Stay (HOSPITAL_COMMUNITY): Admission: AD | Admit: 2008-11-19 | Discharge: 2008-11-24 | Payer: Self-pay | Admitting: Oncology

## 2008-11-19 ENCOUNTER — Ambulatory Visit (HOSPITAL_COMMUNITY): Admission: RE | Admit: 2008-11-19 | Discharge: 2008-11-19 | Payer: Self-pay | Admitting: Oncology

## 2008-11-20 ENCOUNTER — Encounter: Payer: Self-pay | Admitting: Oncology

## 2008-11-20 ENCOUNTER — Encounter (INDEPENDENT_AMBULATORY_CARE_PROVIDER_SITE_OTHER): Payer: Self-pay | Admitting: Interventional Radiology

## 2008-12-08 ENCOUNTER — Ambulatory Visit: Payer: Self-pay | Admitting: Oncology

## 2008-12-08 LAB — CBC WITH DIFFERENTIAL/PLATELET
BASO%: 1.2 % (ref 0.0–2.0)
LYMPH%: 44.9 % (ref 14.0–49.7)
MCHC: 34.2 g/dL (ref 31.5–36.0)
MCV: 93.9 fL (ref 79.5–101.0)
MONO%: 14.9 % — ABNORMAL HIGH (ref 0.0–14.0)
NEUT%: 33.8 % — ABNORMAL LOW (ref 38.4–76.8)
Platelets: 97 10*3/uL — ABNORMAL LOW (ref 145–400)
RBC: 3.77 10*6/uL (ref 3.70–5.45)
nRBC: 0 % (ref 0–0)

## 2008-12-09 LAB — COMPREHENSIVE METABOLIC PANEL
ALT: 17 U/L (ref 0–35)
BUN: 16 mg/dL (ref 6–23)
CO2: 28 mEq/L (ref 19–32)
Creatinine, Ser: 0.59 mg/dL (ref 0.40–1.20)
Total Bilirubin: 0.4 mg/dL (ref 0.3–1.2)

## 2008-12-19 ENCOUNTER — Encounter: Payer: Self-pay | Admitting: Internal Medicine

## 2008-12-19 ENCOUNTER — Ambulatory Visit (HOSPITAL_COMMUNITY): Admission: RE | Admit: 2008-12-19 | Discharge: 2008-12-19 | Payer: Self-pay | Admitting: Oncology

## 2008-12-19 LAB — CBC WITH DIFFERENTIAL/PLATELET
BASO%: 0.8 % (ref 0.0–2.0)
LYMPH%: 15 % (ref 14.0–49.7)
MCH: 33.2 pg (ref 25.1–34.0)
MCHC: 34.2 g/dL (ref 31.5–36.0)
MCV: 97.2 fL (ref 79.5–101.0)
MONO%: 5.2 % (ref 0.0–14.0)
Platelets: 120 10*3/uL — ABNORMAL LOW (ref 145–400)
RBC: 3.71 10*6/uL (ref 3.70–5.45)

## 2008-12-19 LAB — COMPREHENSIVE METABOLIC PANEL
ALT: 14 U/L (ref 0–35)
Alkaline Phosphatase: 33 U/L — ABNORMAL LOW (ref 39–117)
Creatinine, Ser: 0.88 mg/dL (ref 0.40–1.20)
Sodium: 138 mEq/L (ref 135–145)
Total Bilirubin: 0.4 mg/dL (ref 0.3–1.2)
Total Protein: 7.1 g/dL (ref 6.0–8.3)

## 2008-12-19 LAB — URIC ACID: Uric Acid, Serum: 4.9 mg/dL (ref 2.4–7.0)

## 2008-12-23 ENCOUNTER — Ambulatory Visit (HOSPITAL_COMMUNITY): Admission: RE | Admit: 2008-12-23 | Discharge: 2008-12-23 | Payer: Self-pay | Admitting: Oncology

## 2009-01-07 ENCOUNTER — Ambulatory Visit: Payer: Self-pay | Admitting: Pulmonary Disease

## 2009-01-07 DIAGNOSIS — J479 Bronchiectasis, uncomplicated: Secondary | ICD-10-CM

## 2009-01-07 HISTORY — DX: Bronchiectasis, uncomplicated: J47.9

## 2009-01-09 ENCOUNTER — Ambulatory Visit: Payer: Self-pay | Admitting: Oncology

## 2009-01-09 LAB — CBC WITH DIFFERENTIAL/PLATELET
Basophils Absolute: 0 10*3/uL (ref 0.0–0.1)
EOS%: 1.7 % (ref 0.0–7.0)
HCT: 34.9 % (ref 34.8–46.6)
HGB: 11.7 g/dL (ref 11.6–15.9)
LYMPH%: 8.5 % — ABNORMAL LOW (ref 14.0–49.7)
MCH: 31.9 pg (ref 25.1–34.0)
MCV: 95.1 fL (ref 79.5–101.0)
MONO%: 11.9 % (ref 0.0–14.0)
NEUT%: 77.7 % — ABNORMAL HIGH (ref 38.4–76.8)
Platelets: 138 10*3/uL — ABNORMAL LOW (ref 145–400)

## 2009-01-21 ENCOUNTER — Ambulatory Visit: Payer: Self-pay | Admitting: Pulmonary Disease

## 2009-01-27 ENCOUNTER — Encounter: Payer: Self-pay | Admitting: Pulmonary Disease

## 2009-01-27 ENCOUNTER — Encounter (INDEPENDENT_AMBULATORY_CARE_PROVIDER_SITE_OTHER): Payer: Self-pay | Admitting: *Deleted

## 2009-01-27 ENCOUNTER — Ambulatory Visit (HOSPITAL_COMMUNITY): Admission: RE | Admit: 2009-01-27 | Discharge: 2009-01-27 | Payer: Self-pay | Admitting: Pulmonary Disease

## 2009-01-30 ENCOUNTER — Encounter: Payer: Self-pay | Admitting: Pulmonary Disease

## 2009-01-30 LAB — CBC WITH DIFFERENTIAL/PLATELET
Basophils Absolute: 0 10*3/uL (ref 0.0–0.1)
EOS%: 0 % (ref 0.0–7.0)
HCT: 33.9 % — ABNORMAL LOW (ref 34.8–46.6)
HGB: 11.7 g/dL (ref 11.6–15.9)
MCH: 32.7 pg (ref 25.1–34.0)
NEUT%: 68.9 % (ref 38.4–76.8)
Platelets: 133 10*3/uL — ABNORMAL LOW (ref 145–400)
lymph#: 1.1 10*3/uL (ref 0.9–3.3)

## 2009-01-30 LAB — COMPREHENSIVE METABOLIC PANEL
ALT: 14 U/L (ref 0–35)
AST: 15 U/L (ref 0–37)
Calcium: 8.5 mg/dL (ref 8.4–10.5)
Chloride: 100 mEq/L (ref 96–112)
Creatinine, Ser: 0.54 mg/dL (ref 0.40–1.20)
Total Bilirubin: 0.4 mg/dL (ref 0.3–1.2)

## 2009-01-30 LAB — MORPHOLOGY: PLT EST: DECREASED

## 2009-02-05 ENCOUNTER — Telehealth (INDEPENDENT_AMBULATORY_CARE_PROVIDER_SITE_OTHER): Payer: Self-pay | Admitting: *Deleted

## 2009-02-18 ENCOUNTER — Ambulatory Visit: Payer: Self-pay | Admitting: Pulmonary Disease

## 2009-03-19 ENCOUNTER — Ambulatory Visit: Payer: Self-pay | Admitting: Endocrinology

## 2009-03-19 DIAGNOSIS — D61818 Other pancytopenia: Secondary | ICD-10-CM | POA: Insufficient documentation

## 2009-03-19 DIAGNOSIS — M810 Age-related osteoporosis without current pathological fracture: Secondary | ICD-10-CM

## 2009-03-19 DIAGNOSIS — R42 Dizziness and giddiness: Secondary | ICD-10-CM

## 2009-03-19 HISTORY — DX: Age-related osteoporosis without current pathological fracture: M81.0

## 2009-03-19 LAB — CONVERTED CEMR LAB: PTH: 53.1 pg/mL (ref 14.0–72.0)

## 2009-03-20 LAB — CONVERTED CEMR LAB
BUN: 11 mg/dL (ref 6–23)
Basophils Absolute: 0.1 10*3/uL (ref 0.0–0.1)
Basophils Relative: 1.6 % (ref 0.0–3.0)
CO2: 32 meq/L (ref 19–32)
Calcium: 8.6 mg/dL (ref 8.4–10.5)
Chloride: 101 meq/L (ref 96–112)
Creatinine, Ser: 0.7 mg/dL (ref 0.4–1.2)
Eosinophils Absolute: 0.3 10*3/uL (ref 0.0–0.7)
Eosinophils Relative: 9 % — ABNORMAL HIGH (ref 0.0–5.0)
Folate: 17.2 ng/mL
GFR calc non Af Amer: 84.75 mL/min (ref >60)
Glucose, Bld: 86 mg/dL (ref 70–99)
HCT: 34.7 % — ABNORMAL LOW (ref 36.0–46.0)
Hemoglobin: 12.2 g/dL (ref 12.0–15.0)
Iron: 95 ug/dL (ref 42–145)
Lymphocytes Relative: 31.7 % (ref 12.0–46.0)
Lymphs Abs: 1.1 10*3/uL (ref 0.7–4.0)
MCHC: 35.3 g/dL (ref 30.0–36.0)
MCV: 96.8 fL (ref 78.0–100.0)
Monocytes Absolute: 0.5 10*3/uL (ref 0.1–1.0)
Monocytes Relative: 15 % — ABNORMAL HIGH (ref 3.0–12.0)
Neutro Abs: 1.6 10*3/uL (ref 1.4–7.7)
Neutrophils Relative %: 42.7 % — ABNORMAL LOW (ref 43.0–77.0)
Platelets: 133 10*3/uL — ABNORMAL LOW (ref 150.0–400.0)
Potassium: 4.2 meq/L (ref 3.5–5.1)
RBC: 3.58 M/uL — ABNORMAL LOW (ref 3.87–5.11)
RDW: 15.3 % — ABNORMAL HIGH (ref 11.5–14.6)
Saturation Ratios: 21.1 % (ref 20.0–50.0)
Sodium: 138 meq/L (ref 135–145)
TSH: 1.43 microintl units/mL (ref 0.35–5.50)
Transferrin: 322 mg/dL (ref 212.0–360.0)
Vitamin B-12: 410 pg/mL (ref 211–911)
WBC: 3.6 10*3/uL — ABNORMAL LOW (ref 4.5–10.5)

## 2009-03-25 ENCOUNTER — Ambulatory Visit: Payer: Self-pay | Admitting: Internal Medicine

## 2009-04-08 ENCOUNTER — Ambulatory Visit: Payer: Self-pay | Admitting: Oncology

## 2009-04-22 ENCOUNTER — Encounter: Payer: Self-pay | Admitting: Pulmonary Disease

## 2009-05-05 ENCOUNTER — Encounter: Admission: RE | Admit: 2009-05-05 | Discharge: 2009-05-05 | Payer: Self-pay | Admitting: Oncology

## 2009-05-26 ENCOUNTER — Inpatient Hospital Stay (HOSPITAL_COMMUNITY): Admission: EM | Admit: 2009-05-26 | Discharge: 2009-06-02 | Payer: Self-pay | Admitting: Emergency Medicine

## 2009-06-01 ENCOUNTER — Encounter (INDEPENDENT_AMBULATORY_CARE_PROVIDER_SITE_OTHER): Payer: Self-pay | Admitting: Nephrology

## 2009-06-06 ENCOUNTER — Encounter: Payer: Self-pay | Admitting: Internal Medicine

## 2009-06-16 ENCOUNTER — Ambulatory Visit: Payer: Self-pay | Admitting: Internal Medicine

## 2009-06-16 DIAGNOSIS — R042 Hemoptysis: Secondary | ICD-10-CM | POA: Insufficient documentation

## 2009-06-16 DIAGNOSIS — R062 Wheezing: Secondary | ICD-10-CM

## 2009-06-19 ENCOUNTER — Ambulatory Visit: Payer: Self-pay | Admitting: Oncology

## 2009-06-19 ENCOUNTER — Encounter: Payer: Self-pay | Admitting: Internal Medicine

## 2009-06-19 LAB — COMPREHENSIVE METABOLIC PANEL
CO2: 28 mEq/L (ref 19–32)
Calcium: 8.8 mg/dL (ref 8.4–10.5)
Chloride: 100 mEq/L (ref 96–112)
Glucose, Bld: 98 mg/dL (ref 70–99)
Sodium: 138 mEq/L (ref 135–145)
Total Bilirubin: 0.3 mg/dL (ref 0.3–1.2)
Total Protein: 5.6 g/dL — ABNORMAL LOW (ref 6.0–8.3)

## 2009-06-19 LAB — CBC WITH DIFFERENTIAL/PLATELET
BASO%: 0.4 % (ref 0.0–2.0)
HCT: 32.7 % — ABNORMAL LOW (ref 34.8–46.6)
HGB: 11.1 g/dL — ABNORMAL LOW (ref 11.6–15.9)
LYMPH%: 24.8 % (ref 14.0–49.7)
MCH: 31.4 pg (ref 25.1–34.0)
MONO#: 0.4 10*3/uL (ref 0.1–0.9)
MONO%: 11.4 % (ref 0.0–14.0)
NEUT#: 1.9 10*3/uL (ref 1.5–6.5)
RDW: 15.3 % — ABNORMAL HIGH (ref 11.2–14.5)
WBC: 3.3 10*3/uL — ABNORMAL LOW (ref 3.9–10.3)
lymph#: 0.8 10*3/uL — ABNORMAL LOW (ref 0.9–3.3)

## 2009-06-19 LAB — MORPHOLOGY

## 2009-06-25 ENCOUNTER — Encounter (INDEPENDENT_AMBULATORY_CARE_PROVIDER_SITE_OTHER): Payer: Self-pay | Admitting: *Deleted

## 2009-06-25 ENCOUNTER — Ambulatory Visit (HOSPITAL_COMMUNITY): Admission: RE | Admit: 2009-06-25 | Discharge: 2009-06-25 | Payer: Self-pay | Admitting: Oncology

## 2009-08-21 ENCOUNTER — Ambulatory Visit: Payer: Self-pay | Admitting: Internal Medicine

## 2009-08-21 DIAGNOSIS — R3129 Other microscopic hematuria: Secondary | ICD-10-CM

## 2009-08-21 DIAGNOSIS — R350 Frequency of micturition: Secondary | ICD-10-CM

## 2009-08-21 LAB — CONVERTED CEMR LAB
Glucose, Urine, Semiquant: NEGATIVE
Ketones, ur: NEGATIVE mg/dL
Ketones, urine, test strip: NEGATIVE
Leukocytes, UA: NEGATIVE
Nitrite: NEGATIVE
Urine Glucose: NEGATIVE mg/dL
Urobilinogen, UA: 0.2
Urobilinogen, UA: 0.2 (ref 0.0–1.0)
WBC Urine, dipstick: NEGATIVE
pH: 7.5

## 2009-09-22 ENCOUNTER — Ambulatory Visit: Payer: Self-pay | Admitting: Internal Medicine

## 2009-09-22 ENCOUNTER — Ambulatory Visit: Payer: Self-pay | Admitting: Oncology

## 2009-09-22 DIAGNOSIS — C9110 Chronic lymphocytic leukemia of B-cell type not having achieved remission: Secondary | ICD-10-CM | POA: Insufficient documentation

## 2009-09-22 DIAGNOSIS — Z853 Personal history of malignant neoplasm of breast: Secondary | ICD-10-CM

## 2009-09-22 HISTORY — DX: Chronic lymphocytic leukemia of B-cell type not having achieved remission: C91.10

## 2009-09-22 HISTORY — DX: Personal history of malignant neoplasm of breast: Z85.3

## 2009-09-23 ENCOUNTER — Encounter: Payer: Self-pay | Admitting: Internal Medicine

## 2009-09-23 LAB — COMPREHENSIVE METABOLIC PANEL
CO2: 26 mEq/L (ref 19–32)
Calcium: 8.8 mg/dL (ref 8.4–10.5)
Chloride: 102 mEq/L (ref 96–112)
Creatinine, Ser: 0.71 mg/dL (ref 0.40–1.20)
Glucose, Bld: 81 mg/dL (ref 70–99)
Sodium: 139 mEq/L (ref 135–145)
Total Bilirubin: 0.3 mg/dL (ref 0.3–1.2)
Total Protein: 5.6 g/dL — ABNORMAL LOW (ref 6.0–8.3)

## 2009-09-23 LAB — CBC WITH DIFFERENTIAL/PLATELET
Eosinophils Absolute: 0.2 10*3/uL (ref 0.0–0.5)
HCT: 29.7 % — ABNORMAL LOW (ref 34.8–46.6)
LYMPH%: 31.7 % (ref 14.0–49.7)
MONO#: 0.3 10*3/uL (ref 0.1–0.9)
NEUT#: 1.5 10*3/uL (ref 1.5–6.5)
NEUT%: 51 % (ref 38.4–76.8)
Platelets: 130 10*3/uL — ABNORMAL LOW (ref 145–400)
WBC: 3 10*3/uL — ABNORMAL LOW (ref 3.9–10.3)
lymph#: 1 10*3/uL (ref 0.9–3.3)

## 2009-09-23 LAB — LACTATE DEHYDROGENASE: LDH: 173 U/L (ref 94–250)

## 2009-10-27 ENCOUNTER — Ambulatory Visit: Payer: Self-pay | Admitting: Oncology

## 2009-10-28 ENCOUNTER — Encounter: Payer: Self-pay | Admitting: Internal Medicine

## 2009-10-28 LAB — CBC WITH DIFFERENTIAL/PLATELET
BASO%: 0.6 % (ref 0.0–2.0)
HCT: 32.5 % — ABNORMAL LOW (ref 34.8–46.6)
LYMPH%: 23.5 % (ref 14.0–49.7)
MCHC: 33.9 g/dL (ref 31.5–36.0)
MCV: 90.2 fL (ref 79.5–101.0)
MONO#: 0.5 10*3/uL (ref 0.1–0.9)
MONO%: 13.3 % (ref 0.0–14.0)
NEUT%: 56.7 % (ref 38.4–76.8)
Platelets: 158 10*3/uL (ref 145–400)
WBC: 3.8 10*3/uL — ABNORMAL LOW (ref 3.9–10.3)

## 2009-12-02 ENCOUNTER — Ambulatory Visit: Payer: Self-pay | Admitting: Pulmonary Disease

## 2009-12-02 DIAGNOSIS — R0602 Shortness of breath: Secondary | ICD-10-CM | POA: Insufficient documentation

## 2009-12-21 ENCOUNTER — Telehealth: Payer: Self-pay | Admitting: Pulmonary Disease

## 2010-01-07 ENCOUNTER — Ambulatory Visit: Payer: Self-pay | Admitting: Internal Medicine

## 2010-01-07 DIAGNOSIS — S91309A Unspecified open wound, unspecified foot, initial encounter: Secondary | ICD-10-CM | POA: Insufficient documentation

## 2010-01-07 DIAGNOSIS — IMO0002 Reserved for concepts with insufficient information to code with codable children: Secondary | ICD-10-CM

## 2010-02-03 ENCOUNTER — Ambulatory Visit: Payer: Self-pay | Admitting: Oncology

## 2010-02-04 ENCOUNTER — Encounter (INDEPENDENT_AMBULATORY_CARE_PROVIDER_SITE_OTHER): Payer: Self-pay | Admitting: *Deleted

## 2010-02-05 ENCOUNTER — Encounter: Payer: Self-pay | Admitting: Internal Medicine

## 2010-02-05 LAB — MORPHOLOGY: PLT EST: ADEQUATE

## 2010-02-05 LAB — CBC WITH DIFFERENTIAL/PLATELET
Basophils Absolute: 0 10*3/uL (ref 0.0–0.1)
Eosinophils Absolute: 0.4 10*3/uL (ref 0.0–0.5)
HGB: 11.6 g/dL (ref 11.6–15.9)
MCV: 92.2 fL (ref 79.5–101.0)
MONO#: 0.5 10*3/uL (ref 0.1–0.9)
NEUT#: 2.2 10*3/uL (ref 1.5–6.5)
RBC: 3.78 10*6/uL (ref 3.70–5.45)
RDW: 18.1 % — ABNORMAL HIGH (ref 11.2–14.5)
WBC: 4.2 10*3/uL (ref 3.9–10.3)
lymph#: 1.1 10*3/uL (ref 0.9–3.3)

## 2010-02-05 LAB — COMPREHENSIVE METABOLIC PANEL
ALT: 12 U/L (ref 0–35)
AST: 17 U/L (ref 0–37)
Albumin: 4 g/dL (ref 3.5–5.2)
Alkaline Phosphatase: 63 U/L (ref 39–117)
Glucose, Bld: 53 mg/dL — ABNORMAL LOW (ref 70–99)
Potassium: 4.4 mEq/L (ref 3.5–5.3)
Sodium: 139 mEq/L (ref 135–145)
Total Bilirubin: 0.3 mg/dL (ref 0.3–1.2)
Total Protein: 5.6 g/dL — ABNORMAL LOW (ref 6.0–8.3)

## 2010-03-01 ENCOUNTER — Ambulatory Visit: Payer: Self-pay | Admitting: Internal Medicine

## 2010-03-01 DIAGNOSIS — L989 Disorder of the skin and subcutaneous tissue, unspecified: Secondary | ICD-10-CM | POA: Insufficient documentation

## 2010-03-16 ENCOUNTER — Inpatient Hospital Stay (HOSPITAL_COMMUNITY): Admission: EM | Admit: 2010-03-16 | Discharge: 2010-03-23 | Payer: Self-pay | Admitting: Emergency Medicine

## 2010-03-16 ENCOUNTER — Ambulatory Visit: Payer: Self-pay | Admitting: Internal Medicine

## 2010-03-22 ENCOUNTER — Ambulatory Visit: Payer: Self-pay | Admitting: Oncology

## 2010-03-22 ENCOUNTER — Encounter (INDEPENDENT_AMBULATORY_CARE_PROVIDER_SITE_OTHER): Payer: Self-pay | Admitting: *Deleted

## 2010-03-28 ENCOUNTER — Encounter: Payer: Self-pay | Admitting: Internal Medicine

## 2010-04-01 ENCOUNTER — Ambulatory Visit: Payer: Self-pay | Admitting: Oncology

## 2010-04-06 ENCOUNTER — Encounter: Payer: Self-pay | Admitting: Internal Medicine

## 2010-04-15 DIAGNOSIS — K648 Other hemorrhoids: Secondary | ICD-10-CM | POA: Insufficient documentation

## 2010-04-15 DIAGNOSIS — N816 Rectocele: Secondary | ICD-10-CM

## 2010-04-15 DIAGNOSIS — K5730 Diverticulosis of large intestine without perforation or abscess without bleeding: Secondary | ICD-10-CM

## 2010-04-15 DIAGNOSIS — G4730 Sleep apnea, unspecified: Secondary | ICD-10-CM | POA: Insufficient documentation

## 2010-04-15 HISTORY — DX: Diverticulosis of large intestine without perforation or abscess without bleeding: K57.30

## 2010-04-15 HISTORY — DX: Rectocele: N81.6

## 2010-05-26 ENCOUNTER — Ambulatory Visit: Payer: Self-pay | Admitting: Oncology

## 2010-06-01 ENCOUNTER — Ambulatory Visit (HOSPITAL_COMMUNITY): Admission: RE | Admit: 2010-06-01 | Payer: Self-pay | Source: Home / Self Care | Admitting: Oncology

## 2010-06-04 ENCOUNTER — Encounter: Payer: Self-pay | Admitting: Internal Medicine

## 2010-06-04 LAB — COMPREHENSIVE METABOLIC PANEL
ALT: 17 U/L (ref 0–35)
AST: 20 U/L (ref 0–37)
Albumin: 4.1 g/dL (ref 3.5–5.2)
Alkaline Phosphatase: 62 U/L (ref 39–117)
BUN: 14 mg/dL (ref 6–23)
CO2: 28 mEq/L (ref 19–32)
Calcium: 8.7 mg/dL (ref 8.4–10.5)
Chloride: 102 mEq/L (ref 96–112)
Creatinine, Ser: 0.56 mg/dL (ref 0.40–1.20)
Glucose, Bld: 69 mg/dL — ABNORMAL LOW (ref 70–99)
Potassium: 4 mEq/L (ref 3.5–5.3)
Sodium: 137 mEq/L (ref 135–145)
Total Bilirubin: 0.3 mg/dL (ref 0.3–1.2)
Total Protein: 5.9 g/dL — ABNORMAL LOW (ref 6.0–8.3)

## 2010-06-04 LAB — CBC & DIFF AND RETIC
BASO%: 0.4 % (ref 0.0–2.0)
Basophils Absolute: 0 10*3/uL (ref 0.0–0.1)
EOS%: 4.7 % (ref 0.0–7.0)
Eosinophils Absolute: 0.2 10*3/uL (ref 0.0–0.5)
HCT: 29 % — ABNORMAL LOW (ref 34.8–46.6)
HGB: 9.9 g/dL — ABNORMAL LOW (ref 11.6–15.9)
Immature Retic Fract: 17.7 % — ABNORMAL HIGH (ref 0.00–10.70)
LYMPH%: 25.6 % (ref 14.0–49.7)
MCH: 32.2 pg (ref 25.1–34.0)
MCHC: 33.9 g/dL (ref 31.5–36.0)
MCV: 94.9 fL (ref 79.5–101.0)
MONO#: 0.5 10*3/uL (ref 0.1–0.9)
MONO%: 12.4 % (ref 0.0–14.0)
NEUT#: 2.1 10*3/uL (ref 1.5–6.5)
NEUT%: 56.9 % (ref 38.4–76.8)
Platelets: 162 10*3/uL (ref 145–400)
RBC: 3.06 10*6/uL — ABNORMAL LOW (ref 3.70–5.45)
RDW: 16.4 % — ABNORMAL HIGH (ref 11.2–14.5)
Retic %: 2.5 % — ABNORMAL HIGH (ref 0.50–1.50)
Retic Ct Abs: 76.5 10*3/uL — ABNORMAL HIGH (ref 18.30–72.70)
WBC: 3.8 10*3/uL — ABNORMAL LOW (ref 3.9–10.3)
lymph#: 1 10*3/uL (ref 0.9–3.3)
nRBC: 0 % (ref 0–0)

## 2010-06-04 LAB — MORPHOLOGY: PLT EST: ADEQUATE

## 2010-06-04 LAB — IRON AND TIBC
%SAT: 12 % — ABNORMAL LOW (ref 20–55)
Iron: 47 ug/dL (ref 42–145)
TIBC: 397 ug/dL (ref 250–470)
UIBC: 350 ug/dL

## 2010-06-04 LAB — PROTIME-INR
INR: 0.9 — ABNORMAL LOW (ref 2.00–3.50)
Protime: 10.8 Seconds (ref 10.6–13.4)

## 2010-06-04 LAB — FERRITIN: Ferritin: 32 ng/mL (ref 10–291)

## 2010-06-04 LAB — LACTATE DEHYDROGENASE: LDH: 153 U/L (ref 94–250)

## 2010-06-04 LAB — HOLD TUBE, BLOOD BANK

## 2010-06-10 ENCOUNTER — Encounter
Admission: RE | Admit: 2010-06-10 | Discharge: 2010-06-10 | Payer: Self-pay | Source: Home / Self Care | Attending: Oncology | Admitting: Oncology

## 2010-06-20 ENCOUNTER — Encounter: Payer: Self-pay | Admitting: Oncology

## 2010-06-30 ENCOUNTER — Encounter: Payer: Self-pay | Admitting: Oncology

## 2010-07-01 NOTE — Letter (Signed)
Summary: Colonoscopy-Changed to Office Visit Letter  [facility]  [address] [place], [place] [address]   Phone: [phone]  Fax: [phone]      February 04, 2010 MRN: [id number]   [facility] [address] [place], [place]  [address]   Dear Ms. Mittal,   According to our records, it is time for you to schedule a Colonoscopy. However, after reviewing your medical record, I feel that an office visit would be most appropriate to more completely evaluate you and determine your need for a repeat procedure.  Please call [phone] (option #[phone]) at your convenience to schedule an office visit. If you have any questions, concerns, or feel that this letter is in error, we would appreciate your call.   Sincerely,  [name]. [name], M.D.  [facility] [phone]

## 2010-07-01 NOTE — Miscellaneous (Signed)
Summary: Admission H&P/[facility]  Admission H&P/[facility]   Imported By: Sherian Rein 04/08/2010 10:25:26  _____________________________________________________________________  External Attachment:    Type:   Image     Comment:   External Document

## 2010-07-01 NOTE — Letter (Signed)
Summary: Cephas Darby MD/[place]  Cephas Darby MD/[place]   Imported By: Lester Genoa 06/23/2010 10:04:56  _____________________________________________________________________  External Attachment:    Type:   Image     Comment:   External Document

## 2010-07-01 NOTE — Letter (Signed)
Summary: [facility]  [facility]   Imported By: Sherian Rein 02/23/2010 11:24:40  _____________________________________________________________________  External Attachment:    Type:   Image     Comment:   External Document

## 2010-07-01 NOTE — Procedures (Signed)
Summary: EGD   EGD  Procedure date:  03/09/2005  Findings:      Location: [facility]   Patient Name: Audrey Spencer, Audrey Spencer MRN:  Procedure Procedures: Panendoscopy (EGD) CPT: 43235.    with biopsy(s)/brushing(s). CPT: D1846139.  Personnel: Endoscopist: Venita Lick. Russella Dar, MD, Clementeen Graham.  Exam Location: Exam performed in Endoscopy Suite. Inpatient-ward  Patient Consent: Procedure, Alternatives, Risks and Benefits discussed, consent obtained, from patient.  Indications  Evaluation of: Anemia,  Positive fecal occult blood test  History  Current Medications: Patient is not currently taking Coumadin.  Pre-Exam Physical: Performed Mar 09, 2005  Cardio-pulmonary exam, HEENT exam, Abdominal exam, Mental status exam WNL.  Exam Exam Info: Maximum depth of insertion Duodenum, intended Duodenum. Vocal cords not visualized. Gastric retroflexion performed. Images taken. ASA Classification: II. Tolerance: excellent.  Sedation Meds: Patient assessed and found to be appropriate for moderate (conscious) sedation. Cetacaine Spray 2 sprays given aerosolized. Fentanyl 50 mcg. given IV. Versed 4 mg. given IV.  Monitoring: BP and pulse monitoring done. Oximetry used. Supplemental O2 given  Findings Normal: Proximal Esophagus to Pyloric Sphincter.  MUCOSAL ABNORMALITY: Duodenal Bulb. Erosions present. RUT done, results pending. ICD9: Duodenitis without Hemorrhage: 535.60.  Normal: Duodenal 2nd Portion.    Comments: DUODENITIS IS A POSSIBLE SOURCE OF CHRONIC BLOOD LOSS Assessment  Diagnoses: 535.60: Duodenitis without Hemorrhage.   Events  Unplanned Intervention: No unplanned interventions were required.  Unplanned Events: There were no complications. Plans Medication(s): Await pathology. PPI: QAM,   Patient Education: Patient given standard instructions for: Mucosal Abnormality.  Comments: HOLD ASA/NSAIDs Disposition: After procedure patient sent to recovery. After  recovery patient sent back to hospital.  Scheduling: Office Visit, to Dora L. [name], MD, prn    This report was created from the original endoscopy report, which was reviewed and signed by the above listed endoscopist.

## 2010-07-01 NOTE — Assessment & Plan Note (Signed)
Summary: Acute NP office visit - SOB, wheezing   Copy to:  [place] Primary Provider/Referring Provider:  Corwin Levins MD  CC:  wheezing, increased SOB, and occ dry cough x3weeks - denies f/c/s.  History of Present Illness: 77 year old woman , never smoker for FU of  BLL bronchectasis & persistent cough since 6/10. She has h/o  treated lymphoma, CLL (in remission) &  breast CA. Reviewed CT chest 12/23/08 >no evidence of lymphoma recurrence in the chest , mild interval increase in LL bronchectasis. She uses CPAP for obstructive sleep apnea.  She  had a number of courses of outpatient oral antibiotics & prednisone.  She was admitted 6/23- 28 for low back pain & required vertebroplasties T 11 & L1 for new compression fractures. CXR showed a small area of consolidation in the left lower lung. She did not have a fever.  Her initial white blood count was elevated at 21,000 with 91% neutrophils, 3% lymphocytes.  Not clear whether this was a rebound coming off of her recent chemotherapy treatment or represented underlying infection.  She was given a 5-day course of parenteral Rocephin.  Cough improved, but was still persistent at discharge.  January 21, 2009  Completed augmentin 8/20 & steroids 8/18. COugh has returned & is much worse per grand-daughter. She reports yellow phlegm, worse at night, no relief with codeine syrup two times a day. She has a Administrator at home x 15 yrs.  February 18, 2009 12:23 PM  Reviewed MBSS report >> distal esophageal stasis without sensation, reflux precautions. Cough is much improved. She has a wood stove & is changing to gas now. Pneumovax 2009  December 02, 2009--Presents for an acute office visit. Complains of wheezing, increased SOB, occ dry cough x3weeks. Mainly has more dyspnea than usual. Has sinus drip and drianage. Tickle in airway. Cough is minimal w/ discolroed mucus. She does have OSA but is not wearing CPAP. Last seen had chronic cough but this had totally  resolved untl last couple weeks but rates as mild. She is concerned that she wears out easily and gets dyspneic w/ minimal activity, no asscoicated chest pain or palpitations. Denies chest pain,  orthopnea, hemoptysis, fever, n/v/d, edema, headache. Had some recent labs at oncology (CLL)   Medications Prior to Update: 1)  Citalopram Hydrobromide 10 Mg Tabs (Citalopram Hydrobromide) .... Take 1 Tablet By Mouth Once A Day 2)  Acid Reducer Maximum Strength 150 Mg Tabs (Ranitidine Hcl) .... Take 1 Tablet By Mouth Two Times A Day 3)  Xalatan 0.005 %  Soln (Latanoprost) .Marland Kitchen.. 1 Gtt Ou Qd 4)  Combigan 0.2-0.5 %  Soln (Brimonidine Tartrate-Timolol) .Marland Kitchen.. 1 Gtt Ou Bid 5)  Melatonin 3 Mg Tabs (Melatonin) .... Take 1 Tab By Mouth At Bedtime 6)  Calcium-Vitamin D 500-125 Mg-Unit Tabs (Calcium-Vitamin D) .... Take 1 Tablet By Mouth Every Morning 7)  Multivitamins   Tabs (Multiple Vitamin) .... Take 1 Tablet By Mouth Once A Day 8)  Hydrocodone-Homatropine 5-1.5 Mg/2ml Syrp (Hydrocodone-Homatropine) .Marland Kitchen.. 1 Tsp By Mouth At Bedtime As Needed 9)  Omeprazole 20 Mg Cpdr (Omeprazole) .Marland Kitchen.. 1 By Mouth Once Daily  Current Medications (verified): 1)  Citalopram Hydrobromide 10 Mg Tabs (Citalopram Hydrobromide) .... Take 1 Tablet By Mouth Once A Day 2)  Acid Reducer Maximum Strength 150 Mg Tabs (Ranitidine Hcl) .... Take 1 Tablet By Mouth Two Times A Day 3)  Xalatan 0.005 %  Soln (Latanoprost) .Marland Kitchen.. 1 Gtt Ou Qd 4)  Combigan 0.2-0.5 %  Soln (Brimonidine  Tartrate-Timolol) .Marland Kitchen.. 1 Gtt Ou Bid 5)  Melatonin 3 Mg Tabs (Melatonin) .... Take 1 Tab By Mouth At Bedtime 6)  Calcium-Vitamin D 500-125 Mg-Unit Tabs (Calcium-Vitamin D) .... Take 1 Tablet By Mouth Every Morning 7)  Multivitamins   Tabs (Multiple Vitamin) .... Take 1 Tablet By Mouth Once A Day 8)  Hydrocodone-Homatropine 5-1.5 Mg/11ml Syrp (Hydrocodone-Homatropine) .Marland Kitchen.. 1 Tsp By Mouth At Bedtime As Needed 9)  Omeprazole 20 Mg Cpdr (Omeprazole) .... Take 1 Capsule By Mouth  Two Times A Day 30 Minutes Before Meal  Allergies (verified): 1)  ! Doxycycline 2)  Vesicare (Solifenacin Succinate) 3)  Codeine 4)  Nsaids 5)  * Fexofenadine  Past History:  Past Medical History: Last updated: 09/22/2009 breast cancer remote, now with recent mild recurrance lymphoma - b-cell, non hodgkin  - Dr. Cephas Darby Anemia-iron deficiency Diverticulitis, hx of glaucoma hx of rectocele Hyperlipidemia hx of UGI bleed secondary to NSAIDS OAB GERD OSA      - AHI 10 from PSG 03/26/08      - CPAP 10 cm H2O from titration 08/03/08 bronchiectasis  Past Surgical History: Last updated: 03/05/2008 Hysterectomy Rotator cuff repair  Family History: Last updated: 03/05/2008 sister with breast cancer brother with esophagus cancer daughter with alcoholism  Social History: Last updated: 03/19/2009 Never Smoked Alcohol use-no widower, using wood stove son died 10/18/2008  Risk Factors: Smoking Status: never (04/09/2007)  Past Pulmonary History:  Pulmonary History: initially diagnosed with a low grade B-cell non-Hodgkin's lymphoma in July,1993, treated with single-agent cladribine  chemotherapy.  She achieved a prolonged and durable remission.  She developed a second primary stage I, ER positive cancer of the left breast status post lumpectomy, radiation and tamoxifen hormonal therapy which was diagnosed in April, 1998.   She recently developed chronic lymphocytic leukemia which is likely a conversion of her original low grade lymphoma.She has had four treatments to date, most recent given on November 10, 2008.  She  responded well  with resolution of all palpable lymphadenopathy and reversion of her white count differential to normal.  Review of Systems      See HPI  Vital Signs:  Patient profile:   77 year old female Height:      63 inches Weight:      154.31 pounds BMI:     27.43 O2 Sat:      95 % on Room air Temp:     97.8 degrees F oral Pulse rate:   90 /  minute BP sitting:   120 / 72  (left arm) Cuff size:   regular  Vitals Entered By: Boone Master CNA/MA (December 02, 2009 11:07 AM)  O2 Flow:  Room air CC: wheezing, increased SOB, occ dry cough x3weeks - denies f/c/s Comments Medications reviewed with patient Daytime contact number verified with patient. Boone Master CNA/MA  December 02, 2009 11:07 AM  Ambulatory Pulse Oximetry  Resting; HR_91____    02 Sat95_____  Lap1 (185 feet)   HR_102____   02 Sat__95___ Lap2 (185 feet)   HR__123___   02 Sat__98___    Lap3 (185 feet)   HR120_____   02 Sat_98____  _x__Test Completed without Difficulty ___Test Stopped due to:  .Kandice Hams CMA  December 02, 2009 11:44 AM   Physical Exam  Additional Exam:  Gen. Pleasant, well-nourished, in no distress ENT - no lesions, no post nasal drip Neck: No JVD, no thyromegaly, no carotid bruits Lungs: no use of accessory muscles, no dullness to percussion,  no rhonchi  no wheezing.  Cardiovascular: Rhythm regular, heart sounds  normal, no murmurs or gallops, no peripheral edema Musculoskeletal: No deformities, no cyanosis or clubbing      Impression & Recommendations:  Problem # 1:  DYSPNEA (ICD-786.05)  Dyspnea ? etiology, no desaturation in office today.  ? Mild asthmatic flare w/ rhinitis. -xray w/ no acute changes.  REC:  Claritiin 10mg  at bedtime for 5 days then as needed drainage.  Prednisone taper over next week.   follow up 2 weeks Dr. Vassie Loll  Please contact office for sooner follow up if symptoms do not improve or worsen   Orders: T-2 View CXR (71020TC) Nebulizer Tx (40981) Est. Patient Level IV (19147)  Problem # 2:  OBSTRUCTIVE SLEEP APNEA (ICD-327.23)  rec on using nocturnal CPAP to discuss on return.   Orders: Est. Patient Level IV (82956)  Medications Added to Medication List This Visit: 1)  Omeprazole 20 Mg Cpdr (Omeprazole) .... Take 1 capsule by mouth two times a day 30 minutes before meal 2)  Prednisone 10 Mg Tabs  (Prednisone) .... 4 tabs for 2 days, then 3 tabs for 2 days, 2 tabs for 2 days, then 1 tab for 2 days, then stop  Complete Medication List: 1)  Citalopram Hydrobromide 10 Mg Tabs (Citalopram hydrobromide) .... Take 1 tablet by mouth once a day 2)  Acid Reducer Maximum Strength 150 Mg Tabs (Ranitidine hcl) .... Take 1 tablet by mouth two times a day 3)  Xalatan 0.005 % Soln (Latanoprost) .Marland Kitchen.. 1 gtt ou qd 4)  Combigan 0.2-0.5 % Soln (Brimonidine tartrate-timolol) .Marland Kitchen.. 1 gtt ou bid 5)  Melatonin 3 Mg Tabs (Melatonin) .... Take 1 tab by mouth at bedtime 6)  Calcium-vitamin D 500-125 Mg-unit Tabs (Calcium-vitamin d) .... Take 1 tablet by mouth every morning 7)  Multivitamins Tabs (Multiple vitamin) .... Take 1 tablet by mouth once a day 8)  Hydrocodone-homatropine 5-1.5 Mg/39ml Syrp (Hydrocodone-homatropine) .Marland Kitchen.. 1 tsp by mouth at bedtime as needed 9)  Omeprazole 20 Mg Cpdr (Omeprazole) .... Take 1 capsule by mouth two times a day 30 minutes before meal 10)  Prednisone 10 Mg Tabs (Prednisone) .... 4 tabs for 2 days, then 3 tabs for 2 days, 2 tabs for 2 days, then 1 tab for 2 days, then stop  Patient Instructions: 1)  Claritiin 10mg  at bedtime for 5 days then as needed drainage.  2)  Prednisone taper over next week.  3)  I will call xray results.  4)  follow up 2 weeks Dr. Vassie Loll  5)  Please contact office for sooner follow up if symptoms do not improve or worsen  Prescriptions: PREDNISONE 10 MG TABS (PREDNISONE) 4 tabs for 2 days, then 3 tabs for 2 days, 2 tabs for 2 days, then 1 tab for 2 days, then stop  #20 x 0   Entered and Authorized by:   Rubye Oaks NP   Signed by:   Damoni Causby NP on 12/02/2009   Method used:   Electronically to        [id number] (retail)       [address] [place], [place]  [address]       Ph: [phone]       Fax: [phone]   RxID:   [id number]    Immunization History:  Influenza Immunization History:    Influenza:   historical (03/30/2009)

## 2010-07-01 NOTE — Consult Note (Signed)
Summary: Hematology Consultation    NAME:  Audrey Spencer, Audrey Spencer              ACCOUNT NO.:  [id number]      MEDICAL RECORD NO.:  [id number]          PATIENT TYPE:  INP      LOCATION:  1411                         [facility]ACILITY:  [facility]      PHYSICIAN:  Genene Churn. [place], M.D.DATE OF BIRTH:  1923/02/15      DATE OF CONSULTATION:  03/22/2010   DATE OF DISCHARGE:                                    CONSULTATION         This is a hematology consultation requested to evaluate this lady for   pancytopenia.      Audrey Spencer is an 77 year old woman that has been under my care since   diagnosis of a low-grade B-cell non-Hodgkin lymphoma in July 1993.  She   was treated on a clinical trial with single-agent cladribine   chemotherapy.  She had a complete and durable response, which lasted for   many years.  In 2010, she developed a progressive rise in her white   blood count with a shift in the white count differential towards   lymphocytes in a pattern typical of chronic lymphocytic leukemia.  This   is a close cousin to a well-differentiated lymphocytic lymphoma and I   have seen this type of conversion in other patients.  She developed a   concomitant anemia and mild thrombocytopenia.  She was treated with a   combination of bendamustine chemotherapy plus Rituxan, anti B-cell   antibody and had a complete response.  The most recent restaging   evaluation done June 25, 2009 showed no lymphadenopathy or   splenomegaly.      She had a recent February 05, 2010 followup visit in my office.  There   were no new acute problems.  A CBC done on that day showed a hemoglobin   of 11.6, hematocrit 35, MCV 92, white count 4200, 52% neutrophils, 26   lymphocytes, 13 monocytes, and platelet count 149,000.      She presented on the day of the current admission March 16, 2010 with   an approximate 1-week history of progressive nonspecific weakness.  She   had similar symptoms in the past when she had a  pneumonia.  Her   granddaughter encouraged her to come to the emergency department for   further evaluation.  Initial temperature was 98.5 degrees.  It was   repeated while she was still in the ED and was 100.1 degrees.  A chest   radiograph was done which I have personally reviewed.  There were some   mild changes of atelectasis at the bases, for which lower lobe   infection cannot be excluded.  The patient denies any cough.  No sputum   production. Urine appeared grossly infected with too numerous to count white cells.      She was admitted for further evaluation.  Cultures were obtained and she   was started on a combination of Rocephin and azithromycin to treat a   presumed outpatient pneumonia.  She has had no fevers since admission.   Her overall  status has improved with the antibiotics, although a   definite source of infection was really never identified.      In addition to the antibiotics started following admission, she received   multiple different antibiotics in the emergency department on October   18th including Cipro, vancomycin, and Zosyn.     Initial CBC with a white   blood count of 10,100.  No differential was done.  Hemoglobin 9.9,   hematocrit 29.9, platelets 181,000.  A followup CBC on 10/21, hemoglobin   down to 8.6, hematocrit 25.7, white count 2700, no differential.   Platelet count of 161,000.  On October 24th, hemoglobin 7.9, hematocrit   23.9, white count 2700 and platelet count 148,000, MCV 92.      Additional pertinent lab studies done during this admission included a   iron panel with serum iron 24, TIBC 328, percent saturation 7 and a   ferritin of 123 and a pattern consistent with the anemia of chronic   disease.  B12 normal at 1251, folic acid normal at 18.8, reticulocyte   count normal at 1.6 with a bilirubin of 0.4.  Serum protein and albumin   slightly decreased at 6 and 2.7 respectively.  BUN 13, creatinine 0.6.      Cultures of urine showed no  growth.  A urinalysis showed too numerous to   count white blood cells.      Past medical history in addition to above   1. ER-positive cancer of the right breast diagnosed in 1998 treated       with lumpectomy, radiation, and tamoxifen hormonal therapy.   2. Advanced degenerative arthritis status post L1 and T11 kyphoplasty       November 20, 2008.   3. Chronic bronchitis with bronchiectasis.   4. Sleep apnea syndrome on CPAP since October 2009.   5. Remote history of upper GI bleed secondary to nonsteroidal anti-       inflammatory agents.      CURRENT MEDICATIONS:   1. Aspirin 81 mg daily.   2. Alphagan eyedrops one both eyes twice daily.   3. Os-Cal supplement one daily.   4. Celexa 10 mg daily.   5. Colace 100 mg daily.   6. Ferrous sulfate 325 mg b.i.d.   7. Xalatan eye drops 1 drop both eyes h.s.   8. Claritin 10 mg daily.   9. Multivitamins one daily.   10.Protonix 40 mg daily.   11.Timolol hydrops 1 both eyes twice daily.   Outpatient medications:   1. Melatonin 1 h.s. p.r.n. sleep.   2. Hydromet 1 teaspoon h.s. p.r.n. cough.      She has GI intolerance to CODEINE.      FAMILY HISTORY:  Noncontributory.      SOCIAL HISTORY:  She is a widow, still living independently, close niece   look after her.  She lost a sister with breast cancer and a daughter   died of alcohol over use.      PHYSICAL EXAMINATION:  GENERAL:  Pleasant elderly woman.   VITAL SIGNS:  Blood pressure 111/66, pulse 83 regular, respirations 20,   temperature 98, oxygen saturation 95% on room air.   SKIN:  Pale.  No ecchymosis, petechiae, or rash.   HEENT:  Pharynx no erythema or exudate.   NECK:  Supple.   LUNGS:  Clear and resonant to percussion.   HEART:  Regular cardiac rhythm.  No murmur.   ABDOMEN:  Soft, nontender.  No masses or organomegaly.  EXTREMITIES:  No edema.  No calf tenderness.   NEUROLOGIC:  Grossly normal.      Review of the peripheral blood film shows predominantly matured    neutrophils and lymphocytes.  There is a mild eosinophilia.      IMPRESSION:  Abrupt onset of pancytopenia coincident with administration   of multiple different antibiotics.      Although she has an underlying hematologic disorder, low-grade   lymphoma/chronic lymphocytic leukemia, blood counts were good just 1   month ago making it less likely that her underlying blood disorder is   the cause for her current low blood counts.      RECOMMENDATIONS:  I would stop all antibiotics including Avelox started   today.  It appears that she had a urinary tract infection and she has   had more than adequate therapy for this.      I will continue to follow her as an outpatient and check blood counts   again on April 05, 2010 in my office.  If pancytopenia persists or   worsens I will do a bone marrow biopsy as an outpatient.      Thank you for this consultation.               Genene Churn. Cyndie Chime, M.D.               Lottie Rater  D:  03/23/2010  T:  03/23/2010  Job:  [id number]      cc:   Corwin Levins, MD   [address]   [place]   [place] [address]      Coralyn Helling, MD   [address]   [place], [place] [address]      S. Kyra Manges, M.D.   Fax: [phone]      [facility]      Electronically Signed by Cephas Darby M.D. on 03/24/2010 05:32:18 PM

## 2010-07-01 NOTE — Procedures (Signed)
Summary: COLON   Colonoscopy  Procedure date:  05/31/2001  Findings:      Location:  [facility].    Procedures Next Due Date:    Colonoscopy: 05/2006 Patient Name: Audrey Spencer, Audrey Spencer MRN:  Procedure Procedures: Colonoscopy CPT: 56213.  Personnel: Endoscopist: Dora L. [name], MD.  Referred By: Valetta Mole Swords, MD.  Exam Location: Exam performed in Outpatient Clinic. Outpatient  Patient Consent: Procedure, Alternatives, Risks and Benefits discussed, consent obtained, from patient.  Indications  Increased Risk Screening: Personal history of breast cancer. hx of lymphoma.  History  Pre-Exam Physical: Performed May 31, 2001. Cardio-pulmonary exam, Rectal exam, HEENT exam , Abdominal exam, Extremity exam, Neurological exam, Mental status exam WNL.  Exam Exam: Extent of exam reached: Cecum, extent intended: Cecum.  Colon retroflexion performed. Images taken. ASA Classification: II. Tolerance: good.  Monitoring: Pulse and BP monitoring, Oximetry used. Supplemental O2 given.  Colon Prep Used Golytely for colon prep. Prep results: good.  Sedation Meds: Patient assessed and found to be appropriate for moderate (conscious) sedation. Fentanyl 100 mcg. given IV. Versed 5 mg. given IV.  Findings - DIVERTICULOSIS: Sigmoid Colon. Not bleeding. ICD9: Diverticulosis: 562.10. Comments: mild diverticulosis.   Assessment Abnormal examination, see findings above.  Diagnoses: 562.10: Diverticulosis.   Comments: no polyps Events  Unplanned Interventions: No intervention was required.  Unplanned Events: There were no complications. Plans Patient Education: Patient given standard instructions for: Yearly hemoccult testing recommended. Patient instructed to get routine colonoscopy every 5-7 years.  Disposition: After procedure patient sent to recovery. After recovery patient sent home.   This report was created from the original endoscopy report, which was  reviewed and signed by the above listed endoscopist.

## 2010-07-01 NOTE — Miscellaneous (Signed)
Summary: Discharged/[facility]  Discharged/[facility]   Imported By: Sherian Rein 04/08/2010 10:23:26  _____________________________________________________________________  External Attachment:    Type:   Image     Comment:   External Document

## 2010-07-01 NOTE — Assessment & Plan Note (Signed)
Summary: 6 MO ROV /NWS #   Vital Signs:  Patient profile:   77 year old female Height:      63 inches Weight:      156.50 pounds BMI:     27.82 O2 Sat:      96 % on Room air Temp:     97.8 degrees F oral Pulse rate:   80 / minute BP sitting:   106 / 52  (left arm) Cuff size:   regular  Vitals Entered ByZella Ball Ewing (September 22, 2009 1:52 PM)  O2 Flow:  Room air CC: 6 Mo ROV/RE   Primary Care Provider:  Corwin Levins MD  CC:  6 Mo ROV/RE.  History of Present Illness: overall doing well; states has mild left pedal edema intermittent currently now resolved;  Pt denies CP, sob, doe, wheezing, orthopnea, pnd, worsening LE edema, palps, dizziness or syncope  Pt denies new neuro symptoms such as headache, facial or extremity weakness   Good compliance with meds, tolerating well.  No new complaints.  Has stable GI complaints without wt loss, dysphagia, abd pain , bowel change or blood.    Problems Prior to Update: 1)  Cll  (ICD-204.10) 2)  Microscopic Hematuria  (ICD-599.72) 3)  Frequency, Urinary  (ICD-788.41) 4)  Wheezing  (ICD-786.07) 5)  Hemoptysis Unspecified  (ICD-786.30) 6)  Pneumonia  (ICD-486) 7)  Bronchiectasis  (ICD-494.0) 8)  Pancytopenia  (ICD-284.1) 9)  Dizziness and Giddiness  (ICD-780.4) 10)  Osteoporosis  (ICD-733.00) 11)  Bronchiectasis w/o Acute Exacerbation  (ICD-494.0) 12)  Rhinitis, Acute  (ICD-460) 13)  Obstructive Sleep Apnea  (ICD-327.23) 14)  Gerd  (ICD-530.81) 15)  Preventive Health Care  (ICD-V70.0) 16)  Overactive Bladder  (ICD-596.51) 17)  Lymphocytosis  (ICD-288.8) 18)  Hx of Pneumonia  (ICD-486) 19)  Memory Loss  (ICD-780.93) 20)  Fatigue  (ICD-780.79) 21)  Cough  (ICD-786.2) 22)  Back Pain, Acute  (ICD-724.5) 23)  Uti  (ICD-599.0) 24)  Shoulder Pain, Left  (ICD-719.41) 25)  Hyperlipidemia  (ICD-272.4) 26)  Hypertonicity of Bladder  (ICD-596.51) 27)  Glaucoma  (ICD-365.9) 28)  Diverticulitis, Hx of  (ICD-V12.79) 29)  Anemia-iron  Deficiency  (ICD-280.9)  Medications Prior to Update: 1)  Citalopram Hydrobromide 10 Mg Tabs (Citalopram Hydrobromide) .... Take 1 Tablet By Mouth Once A Day 2)  Acid Reducer Maximum Strength 150 Mg Tabs (Ranitidine Hcl) .... Take 1 Tablet By Mouth Two Times A Day 3)  Xalatan 0.005 %  Soln (Latanoprost) .Marland Kitchen.. 1 Gtt Ou Qd 4)  Combigan 0.2-0.5 %  Soln (Brimonidine Tartrate-Timolol) .Marland Kitchen.. 1 Gtt Ou Bid 5)  Melatonin 3 Mg Tabs (Melatonin) .... Take 1 Tab By Mouth At Bedtime 6)  Calcium-Vitamin D 500-125 Mg-Unit Tabs (Calcium-Vitamin D) .... Take 1 Tablet By Mouth Every Morning 7)  Multivitamins   Tabs (Multiple Vitamin) .... Take 1 Tablet By Mouth Once A Day 8)  Hydrocodone-Homatropine 5-1.5 Mg/52ml Syrp (Hydrocodone-Homatropine) .Marland Kitchen.. 1 Tsp By Mouth At Bedtime As Needed 9)  Omeprazole 20 Mg Cpdr (Omeprazole) .Marland Kitchen.. 1 By Mouth Once Daily 10)  Ciprofloxacin Hcl 500 Mg Tabs (Ciprofloxacin Hcl) .Marland Kitchen.. 1po Two Times A Day  Current Medications (verified): 1)  Citalopram Hydrobromide 10 Mg Tabs (Citalopram Hydrobromide) .... Take 1 Tablet By Mouth Once A Day 2)  Acid Reducer Maximum Strength 150 Mg Tabs (Ranitidine Hcl) .... Take 1 Tablet By Mouth Two Times A Day 3)  Xalatan 0.005 %  Soln (Latanoprost) .Marland Kitchen.. 1 Gtt Ou Qd 4)  Combigan 0.2-0.5 %  Soln (Brimonidine Tartrate-Timolol) .Marland Kitchen.. 1 Gtt Ou Bid 5)  Melatonin 3 Mg Tabs (Melatonin) .... Take 1 Tab By Mouth At Bedtime 6)  Calcium-Vitamin D 500-125 Mg-Unit Tabs (Calcium-Vitamin D) .... Take 1 Tablet By Mouth Every Morning 7)  Multivitamins   Tabs (Multiple Vitamin) .... Take 1 Tablet By Mouth Once A Day 8)  Hydrocodone-Homatropine 5-1.5 Mg/79ml Syrp (Hydrocodone-Homatropine) .Marland Kitchen.. 1 Tsp By Mouth At Bedtime As Needed 9)  Omeprazole 20 Mg Cpdr (Omeprazole) .Marland Kitchen.. 1 By Mouth Once Daily  Allergies (verified): 1)  ! Doxycycline 2)  Vesicare (Solifenacin Succinate) 3)  Codeine 4)  Nsaids 5)  * Fexofenadine  Past History:  Past Surgical History: Last updated:  03/05/2008 Hysterectomy Rotator cuff repair  Social History: Last updated: 03/19/2009 Never Smoked Alcohol use-no widower, using wood stove son died 11-02-08  Risk Factors: Smoking Status: never (04/09/2007)  Past Medical History: breast cancer remote, now with recent mild recurrance lymphoma - b-cell, non hodgkin  - Dr. Cephas Darby Anemia-iron deficiency Diverticulitis, hx of glaucoma hx of rectocele Hyperlipidemia hx of UGI bleed secondary to NSAIDS OAB GERD OSA      - AHI 10 from PSG 03/26/08      - CPAP 10 cm H2O from titration 08/03/08 bronchiectasis  Review of Systems       all otherwise negative per pt -    Physical Exam  General:  alert and well-developed.   Head:  normocephalic and atraumatic.   Eyes:  vision grossly intact, pupils equal, and pupils round.   Ears:  R ear normal and L ear normal.   Nose:  no external deformity and no nasal discharge.   Mouth:  no gingival abnormalities and pharynx pink and moist.   Neck:  supple and no masses.   Lungs:  normal respiratory effort and normal breath sounds.   Heart:  normal rate and regular rhythm.   Abdomen:  soft, normal bowel sounds, and no distention.   Msk:  no joint tenderness and no joint swelling.   Extremities:  no edema, no erythema    Impression & Recommendations:  Problem # 1:  GERD (ICD-530.81)  Her updated medication list for this problem includes:    Acid Reducer Maximum Strength 150 Mg Tabs (Ranitidine hcl) .Marland Kitchen... Take 1 tablet by mouth two times a day    Omeprazole 20 Mg Cpdr (Omeprazole) .Marland Kitchen... 1 by mouth once daily treat as above, f/u any worsening signs or symptoms   Complete Medication List: 1)  Citalopram Hydrobromide 10 Mg Tabs (Citalopram hydrobromide) .... Take 1 tablet by mouth once a day 2)  Acid Reducer Maximum Strength 150 Mg Tabs (Ranitidine hcl) .... Take 1 tablet by mouth two times a day 3)  Xalatan 0.005 % Soln (Latanoprost) .Marland Kitchen.. 1 gtt ou qd 4)  Combigan 0.2-0.5 %  Soln (Brimonidine tartrate-timolol) .Marland Kitchen.. 1 gtt ou bid 5)  Melatonin 3 Mg Tabs (Melatonin) .... Take 1 tab by mouth at bedtime 6)  Calcium-vitamin D 500-125 Mg-unit Tabs (Calcium-vitamin d) .... Take 1 tablet by mouth every morning 7)  Multivitamins Tabs (Multiple vitamin) .... Take 1 tablet by mouth once a day 8)  Hydrocodone-homatropine 5-1.5 Mg/68ml Syrp (Hydrocodone-homatropine) .Marland Kitchen.. 1 tsp by mouth at bedtime as needed 9)  Omeprazole 20 Mg Cpdr (Omeprazole) .Marland Kitchen.. 1 by mouth once daily  Patient Instructions: 1)  Continue all previous medications as before this visit  2)  plesae keep your appt with Dr Cyndie Chime as planned 3)  Please schedule a follow-up appointment in 6 months  with CPX labs

## 2010-07-01 NOTE — Letter (Signed)
[facility]   Imported By: Lester [place] 07/07/2009 10:21:27  _____________________________________________________________________  External Attachment:    Type:   Image     Comment:   External Document

## 2010-07-01 NOTE — Assessment & Plan Note (Signed)
Summary: glass in right foot/cd   Vital Signs:  Patient profile:   77 year old female Height:      63 inches Weight:      157.75 pounds BMI:     28.05 O2 Sat:      94 % on Room air Temp:     97.8 degrees F oral Pulse rate:   79 / minute Pulse rhythm:   regular BP sitting:   116 / 80  (left arm) Cuff size:   large  Vitals Entered By: Rock Nephew CMA (January 07, 2010 4:02 PM)  O2 Flow:  Room air CC: pt think broken glass  in right foot x several days due to getting some glass off the floor   Primary Care [facility] Faciane:  Corwin Levins MD  CC:  pt think broken glass  in right foot x several days due to getting some glass off the floor.  History of Present Illness: She feels like she has piece of glass in her right foot for several days after stepping on a Christmas ornament.  Preventive Screening-Counseling & Management  Alcohol-Tobacco     Alcohol drinks/day: 0     Smoking Status: never  Hep-HIV-STD-Contraception     Hepatitis Risk: no risk noted     HIV Risk: no risk noted     STD Risk: no risk noted  Clinical Review Panels:  Immunizations   Last Tetanus Booster:  Td (03/25/2009)   Last Flu Vaccine:  Historical (03/30/2009)   Last H1N1 Vaccine 1:  Given (05/08/2008)   Last Pneumovax:  Pneumovax (02/26/2008)   Current Medications (verified): 1)  Citalopram Hydrobromide 10 Mg Tabs (Citalopram Hydrobromide) .... Take 1 Tablet By Mouth Once A Day 2)  Acid Reducer Maximum Strength 150 Mg Tabs (Ranitidine Hcl) .... Take 1 Tablet By Mouth Two Times A Day 3)  Xalatan 0.005 %  Soln (Latanoprost) .Marland Kitchen.. 1 Gtt Ou Qd 4)  Combigan 0.2-0.5 %  Soln (Brimonidine Tartrate-Timolol) .Marland Kitchen.. 1 Gtt Ou Bid 5)  Melatonin 3 Mg Tabs (Melatonin) .... Take 1 Tab By Mouth At Bedtime 6)  Calcium-Vitamin D 500-125 Mg-Unit Tabs (Calcium-Vitamin D) .... Take 1 Tablet By Mouth Every Morning 7)  Multivitamins   Tabs (Multiple Vitamin) .... Take 1 Tablet By Mouth Once A Day 8)  Hydrocodone-Homatropine  5-1.5 Mg/19ml Syrp (Hydrocodone-Homatropine) .Marland Kitchen.. 1 Tsp By Mouth At Bedtime As Needed 9)  Omeprazole 20 Mg Cpdr (Omeprazole) .... Take 1 Capsule By Mouth Two Times A Day 30 Minutes Before Meal 10)  Prednisone 10 Mg Tabs (Prednisone) .... 4 Tabs For 2 Days, Then 3 Tabs For 2 Days, 2 Tabs For 2 Days, Then 1 Tab For 2 Days, Then Stop 11)  Ceftin 500 Mg Tab (Cefuroxime Axetil) .... Take One (1) Tablet By Mouth Two (2) Times A Day X 10 Days  Allergies (verified): 1)  ! Doxycycline 2)  Vesicare (Solifenacin Succinate) 3)  Codeine 4)  Nsaids 5)  * Fexofenadine  Past History:  Past Medical History: Last updated: 09/22/2009 breast cancer remote, now with recent mild recurrance lymphoma - b-cell, non hodgkin  - Dr. Cephas Darby Anemia-iron deficiency Diverticulitis, hx of glaucoma hx of rectocele Hyperlipidemia hx of UGI bleed secondary to NSAIDS OAB GERD OSA      - AHI 10 from PSG 03/26/08      - CPAP 10 cm H2O from titration 08/03/08 bronchiectasis  Past Surgical History: Last updated: 03/05/2008 Hysterectomy Rotator cuff repair  Family History: Last updated: 03/05/2008 sister with breast  cancer brother with esophagus cancer daughter with alcoholism  Social History: Last updated: 03/19/2009 Never Smoked Alcohol use-no widower, using wood stove son died 31-Oct-2008  Risk Factors: Alcohol Use: 0 (01/07/2010)  Risk Factors: Smoking Status: never (01/07/2010)  Family History: Reviewed history from 03/05/2008 and no changes required. sister with breast cancer brother with esophagus cancer daughter with alcoholism  Social History: Reviewed history from 03/19/2009 and no changes required. Never Smoked Alcohol use-no widower, using wood stove son died 10/31/2008 Hepatitis Risk:  no risk noted HIV Risk:  no risk noted STD Risk:  no risk noted  Review of Systems  The patient denies anorexia, fever, chest pain, syncope, abdominal pain, enlarged lymph nodes, and  angioedema.   General:  Denies chills, fatigue, fever, malaise, and sweats. MS:  Complains of joint pain; denies joint redness, joint swelling, low back pain, muscle, muscle weakness, and stiffness.  Physical Exam  General:  alert, well-developed, well-nourished, and well-hydrated.   Mouth:  Oral mucosa and oropharynx without lesions or exudates.  Teeth in good repair. Neck:  supple, full ROM, and no masses.   Lungs:  Normal respiratory effort, chest expands symmetrically. Lungs are clear to auscultation, no crackles or wheezes. Heart:  Normal rate and regular rhythm. S1 and S2 normal without gallop, murmur, click, rub or other extra sounds. Abdomen:  soft, normal bowel sounds, and no distention.   Msk:  small puncture wound noted on the plantar side of right foot, no redness/swelling/exudate/streaking, I can feel the sharp edge of a FB Pulses:  R and L carotid,radial,femoral,dorsalis pedis and posterior tibial pulses are full and equal bilaterally Extremities:  No clubbing, cyanosis, edema, or deformity noted with normal full range of motion of all joints.   Neurologic:  No cranial nerve deficits noted. Station and gait are normal. Plantar reflexes are down-going bilaterally. DTRs are symmetrical throughout. Sensory, motor and coordinative functions appear intact. Skin:  turgor normal, color normal, no rashes, no suspicious lesions, no ecchymoses, no petechiae, no purpura, no ulcerations, and no edema.   Cervical Nodes:  No lymphadenopathy noted Psych:  Cognition and judgment appear intact. Alert and cooperative with normal attention span and concentration. No apparent delusions, illusions, hallucinations Additional Exam:  plantar side of right foot was cleaned with betadine and prepped and draped in sterile fashion, local anesthesia was obtained with 2% plain lidocaine, a very small incision was made and a piece of glass was removed without difficulty. she tolerated it well with no blood loss.  neosporin a ointment and a dressing were appled.   Impression & Recommendations:  Problem # 1:  FOREIGN BODY, FOOT (ICD-917.6) ceftin to prevent wound infection Orders: T-Foot Right (73630TC)  Problem # 2:  OPEN WOUND OF FOOT EXCEPT TOE ALONE COMPLICATED (ICD-892.1)  tdap booster  Orders: Incision and Removal of Foreign Body, subq tissues; simple (10120)  Complete Medication List: 1)  Citalopram Hydrobromide 10 Mg Tabs (Citalopram hydrobromide) .... Take 1 tablet by mouth once a day 2)  Acid Reducer Maximum Strength 150 Mg Tabs (Ranitidine hcl) .... Take 1 tablet by mouth two times a day 3)  Xalatan 0.005 % Soln (Latanoprost) .Marland Kitchen.. 1 gtt ou qd 4)  Combigan 0.2-0.5 % Soln (Brimonidine tartrate-timolol) .Marland Kitchen.. 1 gtt ou bid 5)  Melatonin 3 Mg Tabs (Melatonin) .... Take 1 tab by mouth at bedtime 6)  Calcium-vitamin D 500-125 Mg-unit Tabs (Calcium-vitamin d) .... Take 1 tablet by mouth every morning 7)  Multivitamins Tabs (Multiple vitamin) .... Take 1 tablet by mouth  once a day 8)  Hydrocodone-homatropine 5-1.5 Mg/53ml Syrp (Hydrocodone-homatropine) .Marland Kitchen.. 1 tsp by mouth at bedtime as needed 9)  Omeprazole 20 Mg Cpdr (Omeprazole) .... Take 1 capsule by mouth two times a day 30 minutes before meal 10)  Prednisone 10 Mg Tabs (Prednisone) .... 4 tabs for 2 days, then 3 tabs for 2 days, 2 tabs for 2 days, then 1 tab for 2 days, then stop 11)  Ceftin 500 Mg Tab (Cefuroxime axetil) .... Take one (1) tablet by mouth two (2) times a day x 10 days  Patient Instructions: 1)  Please schedule a follow-up appointment in 2 weeks. 2)  Take your antibiotic as prescribed until ALL of it is gone, but stop if you develop a rash or swelling and contact our office as soon as possible. Prescriptions: CEFTIN 500 MG TAB (CEFUROXIME AXETIL) Take one (1) tablet by mouth two (2) times a day X 10 days  #20 x 0   Entered and Authorized by:   Etta Grandchild MD   Signed by:   Etta Grandchild MD on 01/07/2010   Method  used:   Electronically to        [facility] [address] [id number]* (retail)       [address] [place], [place]  [address]       Ph: [phone]       Fax: [phone]   RxID:   [id number]

## 2010-07-01 NOTE — Procedures (Signed)
Summary: [facility]   Imported By: Lamona Curl CMA (AAMA) 04/15/2010 15:36:06  _____________________________________________________________________  External Attachment:    Type:   Image     Comment:   External Document

## 2010-07-01 NOTE — Letter (Signed)
[facility]   Imported By: Sherian Rein 11/10/2009 10:10:48  _____________________________________________________________________  External Attachment:    Type:   Image     Comment:   External Document

## 2010-07-01 NOTE — Assessment & Plan Note (Signed)
Summary: PLACE ON LEG MAY BE INFECTED/ GRANDAUGHTER WANTED MON APPT/NWS   Vital Signs:  Patient profile:   77 year old female Height:      63 inches Weight:      158.50 pounds BMI:     28.18 O2 Sat:      93 % on Room air Temp:     97 degrees F oral Pulse rate:   63 / minute BP sitting:   94 / 58  (left arm) Cuff size:   regular  Vitals Entered By: Zella Ball Ewing CMA Duncan Dull) (March 01, 2010 10:33 AM)  O2 Flow:  Room air  Preventive Care Screening     decliens further colonoscopy, pap/mammogram  CC: left leg infection, cough and congestion/RE/wellness   Primary Care Provider:  Corwin Levins MD  CC:  left leg infection and cough and congestion/RE/wellness.  History of Present Illness: here for wellness and acute  - accompanied by daughter who has several concerns; first pt has 1 wk acute onset fever, mild ST and prod cough with greenish sputum;  Pt denies CP, worsening sob, doe, wheezing, orthopnea, pnd, worsening LE edema, palps, dizziness or syncope  Pt denies new neuro symptoms such as headache, facial or extremity weakness  No recent other wt loss, night sweats, loss of appetite or other constitutional symptoms , denies polydipsia or polyruai, worsening depressvie symptoms, or panic or agitaiton.  Does have an persistent skin lesion (4 months per daughter) just over 1 cm to the left lateral prox leg, that is mild to tender to palpate but o/w no drainage, red streaks , gait change, falls or recent injury or chills.  Just doesnt seem to want to heal.  Preventive Screening-Counseling & Management      Drug Use:  no.    Problems Prior to Update: 1)  Skin Lesion  (ICD-709.9) 2)  Bronchitis-acute  (ICD-466.0) 3)  Open Wound of Foot Except Toe Alone Complicated  (ICD-892.1) 4)  Foreign Body, Foot  (ICD-917.6) 5)  Dyspnea  (ICD-786.05) 6)  Personal History of Malignant Neoplasm of Breast  (ICD-V10.3) 7)  Cll  (ICD-204.10) 8)  Microscopic Hematuria  (ICD-599.72) 9)  Frequency,  Urinary  (ICD-788.41) 10)  Wheezing  (ICD-786.07) 11)  Hemoptysis Unspecified  (ICD-786.30) 12)  Pneumonia  (ICD-486) 13)  Bronchiectasis  (ICD-494.0) 14)  Pancytopenia  (ICD-284.1) 15)  Dizziness and Giddiness  (ICD-780.4) 16)  Osteoporosis  (ICD-733.00) 17)  Bronchiectasis w/o Acute Exacerbation  (ICD-494.0) 18)  Rhinitis, Acute  (ICD-460) 19)  Obstructive Sleep Apnea  (ICD-327.23) 20)  Gerd  (ICD-530.81) 21)  Preventive Health Care  (ICD-V70.0) 22)  Overactive Bladder  (ICD-596.51) 23)  Lymphocytosis  (ICD-288.8) 24)  Hx of Pneumonia  (ICD-486) 25)  Memory Loss  (ICD-780.93) 26)  Fatigue  (ICD-780.79) 27)  Cough  (ICD-786.2) 28)  Back Pain, Acute  (ICD-724.5) 29)  Uti  (ICD-599.0) 30)  Shoulder Pain, Left  (ICD-719.41) 31)  Hyperlipidemia  (ICD-272.4) 32)  Hypertonicity of Bladder  (ICD-596.51) 33)  Glaucoma  (ICD-365.9) 34)  Diverticulitis, Hx of  (ICD-V12.79) 35)  Anemia-iron Deficiency  (ICD-280.9)  Medications Prior to Update: 1)  Citalopram Hydrobromide 10 Mg Tabs (Citalopram Hydrobromide) .... Take 1 Tablet By Mouth Once A Day 2)  Acid Reducer Maximum Strength 150 Mg Tabs (Ranitidine Hcl) .... Take 1 Tablet By Mouth Two Times A Day 3)  Xalatan 0.005 %  Soln (Latanoprost) .Marland Kitchen.. 1 Gtt Ou Qd 4)  Combigan 0.2-0.5 %  Soln (Brimonidine Tartrate-Timolol) .Marland Kitchen.. 1 Gtt  Ou Bid 5)  Melatonin 3 Mg Tabs (Melatonin) .... Take 1 Tab By Mouth At Bedtime 6)  Calcium-Vitamin D 500-125 Mg-Unit Tabs (Calcium-Vitamin D) .... Take 1 Tablet By Mouth Every Morning 7)  Multivitamins   Tabs (Multiple Vitamin) .... Take 1 Tablet By Mouth Once A Day 8)  Hydrocodone-Homatropine 5-1.5 Mg/86ml Syrp (Hydrocodone-Homatropine) .Marland Kitchen.. 1 Tsp By Mouth At Bedtime As Needed 9)  Omeprazole 20 Mg Cpdr (Omeprazole) .... Take 1 Capsule By Mouth Two Times A Day 30 Minutes Before Meal 10)  Prednisone 10 Mg Tabs (Prednisone) .... 4 Tabs For 2 Days, Then 3 Tabs For 2 Days, 2 Tabs For 2 Days, Then 1 Tab For 2 Days, Then  Stop  Current Medications (verified): 1)  Citalopram Hydrobromide 10 Mg Tabs (Citalopram Hydrobromide) .... Take 1 Tablet By Mouth Once A Day 2)  Xalatan 0.005 %  Soln (Latanoprost) .Marland Kitchen.. 1 Gtt Ou Qd 3)  Combigan 0.2-0.5 %  Soln (Brimonidine Tartrate-Timolol) .Marland Kitchen.. 1 Gtt Ou Bid 4)  Melatonin 3 Mg Tabs (Melatonin) .... Take 1 Tab By Mouth At Bedtime 5)  Calcium-Vitamin D 500-125 Mg-Unit Tabs (Calcium-Vitamin D) .... Take 1 Tablet By Mouth Every Morning 6)  Multivitamins   Tabs (Multiple Vitamin) .... Take 1 Tablet By Mouth Once A Day 7)  Hydrocodone-Homatropine 5-1.5 Mg/108ml Syrp (Hydrocodone-Homatropine) .Marland Kitchen.. 1 Tsp By Mouth Q 6 Hrs As Needed Cough 8)  Omeprazole 20 Mg Cpdr (Omeprazole) .... Take 1 Capsule By Mouth Two Times A Day 30 Minutes Before Meal 9)  Doxycycline Hyclate 100 Mg Caps (Doxycycline Hyclate) .Marland Kitchen.. 1po Two Times A Day  Allergies (verified): 1)  ! Doxycycline 2)  Vesicare (Solifenacin Succinate) 3)  Codeine 4)  Nsaids 5)  * Fexofenadine  Past History:  Family History: Last updated: 03/05/2008 sister with breast cancer brother with esophagus cancer daughter with alcoholism  Social History: Last updated: 03/01/2010 Never Smoked Alcohol use-no widower, using wood stove son died 10/13/2008 Drug use-no  Risk Factors: Alcohol Use: 0 (01/07/2010)  Risk Factors: Smoking Status: never (01/07/2010)  Past Medical History: breast cancer remote, now with recent mild recurrance lymphoma - b-cell, non hodgkin  - now with CLL type picture - Dr. Cephas Darby Anemia-iron deficiency Diverticulitis, hx of glaucoma hx of rectocele Hyperlipidemia hx of UGI bleed secondary to NSAIDS OAB GERD OSA      - AHI 10 from [facility] 03/26/08      - CPAP 10 cm H2O from titration 08/03/08 bronchiectasis  Past Surgical History: Reviewed history from 03/05/2008 and no changes required. Hysterectomy Rotator cuff repair  Family History: Reviewed history from 03/05/2008 and no changes  required. sister with breast cancer brother with esophagus cancer daughter with alcoholism  Social History: Reviewed history from 03/19/2009 and no changes required. Never Smoked Alcohol use-no widower, using wood stove son died 2008/10/13 Drug use-no Drug Use:  no  Review of Systems  The patient denies anorexia, fever, vision loss, decreased hearing, hoarseness, chest pain, syncope, dyspnea on exertion, peripheral edema, prolonged cough, headaches, hemoptysis, abdominal pain, melena, hematochezia, severe indigestion/heartburn, hematuria, muscle weakness, transient blindness, difficulty walking, depression, unusual weight change, abnormal bleeding, enlarged lymph nodes, and angioedema.         all otherwise negative per pt -  except for ongoing fatigue without OSA or depressive symptoms   Physical Exam  General:  alert, well-developed, well-nourished, and well-hydrated.   Head:  normocephalic and atraumatic.   Eyes:  vision grossly intact, pupils equal, and pupils round.   Ears:  bilat tm's  mild red, sinus nontender Nose:  nasal dischargemucosal pallor and mucosal edema.   Mouth:  pharyngeal erythema and fair dentition.   Neck:  supple, full ROM, and no masses.   Lungs:  Normal respiratory effort, chest expands symmetrically. Lungs are clear to auscultation, no crackles or wheezes. Heart:  Normal rate and regular rhythm. S1 and S2 normal without gallop, murmur, click, rub or other extra sounds. Abdomen:  soft, normal bowel sounds, and no distention.   Msk:  no acute joint tenderness and no joint swelling.   Extremities:  No clubbing, cyanosis, edema, or deformity noted with normal full range of motion of all joints.   Neurologic:  strength normal in all extremities and gait normal.  but frail, but only somewhat unsteady Skin:  left leg with erythem lesion, mild tender, non raised but mild ulcerated, no red streaks or significant swelling or drainage Psych:  not anxious appearing and not  depressed appearing.     Impression & Recommendations:  Problem # 1:  Preventive Health Care (ICD-V70.0) Overall doing well, age appropriate education and counseling updated and referral for appropriate preventive services done unless declined, immunizations up to date or declined, diet counseling done if overweight, urged to quit smoking if smokes , most recent labs reviewed and current ordered if appropriate, ecg reviewed or declined (interpretation per ECG scanned in the EMR if done); information regarding Medicare Prevention requirements given if appropriate; speciality referrals updated as appropriate   Problem # 2:  BRONCHITIS-ACUTE (ICD-466.0)  Her updated medication list for this problem includes:    Hydrocodone-homatropine 5-1.5 Mg/86ml Syrp (Hydrocodone-homatropine) .Marland Kitchen... 1 tsp by mouth q 6 hrs as needed cough    Doxycycline Hyclate 100 Mg Caps (Doxycycline hyclate) .Marland Kitchen... 1po two times a day  Orders: T-2 View CXR, Same Day (71020.5TC) treat as above, f/u any worsening signs or symptoms , cant r/o pna - to check cxr as well   Problem # 3:  SKIN LESION (ICD-709.9) left leg - ? early cellulitis - treat as above, f/u any worsening signs or symptoms , to derm if not improved  Problem # 4:  FATIGUE (ICD-780.79) exam benign, pt declines labs as she is follow closely wth her CLL per oncology; to follow with expectant management   Complete Medication List: 1)  Citalopram Hydrobromide 10 Mg Tabs (Citalopram hydrobromide) .... Take 1 tablet by mouth once a day 2)  Xalatan 0.005 % Soln (Latanoprost) .Marland Kitchen.. 1 gtt ou qd 3)  Combigan 0.2-0.5 % Soln (Brimonidine tartrate-timolol) .Marland Kitchen.. 1 gtt ou bid 4)  Melatonin 3 Mg Tabs (Melatonin) .... Take 1 tab by mouth at bedtime 5)  Calcium-vitamin D 500-125 Mg-unit Tabs (Calcium-vitamin d) .... Take 1 tablet by mouth every morning 6)  Multivitamins Tabs (Multiple vitamin) .... Take 1 tablet by mouth once a day 7)  Hydrocodone-homatropine 5-1.5 Mg/56ml  Syrp (Hydrocodone-homatropine) .Marland Kitchen.. 1 tsp by mouth q 6 hrs as needed cough 8)  Omeprazole 20 Mg Cpdr (Omeprazole) .... Take 1 capsule by mouth two times a day 30 minutes before meal 9)  Doxycycline Hyclate 100 Mg Caps (Doxycycline hyclate) .Marland Kitchen.. 1po two times a day  Other Orders: Flu Vaccine 96yrs + MEDICARE PATIENTS (N8295) Administration Flu vaccine - MCR (A2130) Future Orders: Admin 1st Vaccine (86578) ... 03/02/2010 DT Vaccine (46962) ... 03/02/2010  Patient Instructions: 1)  please schedule your yearly mamogram 2)  Please take all new medications as prescribed 3)  Continue all previous medications as before this visit , except OK to stop the  ranitidine 4)  if the leg skin lesion is not better in 3 to 5 days, please call for referral to Dermatology 5)  Please go to Radiology in the basement level for your X-Ray today  6)  Please call the number on the [facility] for results of your testing  Prescriptions: HYDROCODONE-HOMATROPINE 5-1.5 MG/5ML SYRP (HYDROCODONE-HOMATROPINE) 1 tsp by mouth q 6 hrs as needed cough  #6 oz x 1   Entered and Authorized by:   Corwin Levins MD   Signed by:   Corwin Levins MD on 03/01/2010   Method used:   Print then Give to Patient   RxID:   [id number] OMEPRAZOLE 20 MG CPDR (OMEPRAZOLE) Take 1 capsule by mouth two times a day 30 minutes before meal  #60 x 11   Entered and Authorized by:   Corwin Levins MD   Signed by:   Corwin Levins MD on 03/01/2010   Method used:   Print then Give to Patient   RxID:   [id number] DOXYCYCLINE HYCLATE 100 MG CAPS (DOXYCYCLINE HYCLATE) 1po two times a day  #20 x 0   Entered and Authorized by:   Corwin Levins MD   Signed by:   Corwin Levins MD on 03/01/2010   Method used:   Print then Give to Patient   RxID:   [id number]   Flu Vaccine Consent Questions     Do you have a history of severe allergic reactions to this vaccine? no    Any prior history of allergic reactions to egg and/or gelatin? no    Do you have a  sensitivity to the preservative Thimersol? no    Do you have a past history of Guillan-Barre Syndrome? no    Do you currently have an acute febrile illness? no    Have you ever had a severe reaction to latex? no    Vaccine information given and explained to patient? yes    Are you currently pregnant? no    Lot Number:[id number]   Exp Date:11/27/2010   Site Given  Left Deltoid IMdflu

## 2010-07-01 NOTE — Assessment & Plan Note (Signed)
Summary: blood in sputum/#/cd   Vital Signs:  Patient profile:   77 year old female Height:      63 inches Weight:      150 pounds O2 Sat:      93 % on Room air Temp:     97.1 degrees F oral Pulse rate:   74 / minute BP sitting:   118 / 64  (left arm) Cuff size:   regular  Vitals Entered ByZella Ball Ewing (June 16, 2009 3:00 PM)  O2 Flow:  Room air CC: cough, blood in sputum/RE   Primary Care Provider:  Corwin Levins MD  CC:  cough and blood in sputum/RE.  History of Present Illness: here post hospn;  was doing better, then worse again with increased prod cough and frank small volume bloody sputum;  mild sob but no ST or high fever ,  and Pt denies CP, sob, doe, wheezing, orthopnea, pnd, worsening LE edema, palps, dizziness or syncope  Has had increased reflux without dysphagia, n/v, abd pain or change in bowel habits for several days as well.    Problems Prior to Update: 1)  Wheezing  (ICD-786.07) 2)  Hemoptysis Unspecified  (ICD-786.30) 3)  Pneumonia  (ICD-486) 4)  Bronchiectasis  (ICD-494.0) 5)  Pancytopenia  (ICD-284.1) 6)  Dizziness and Giddiness  (ICD-780.4) 7)  Osteoporosis  (ICD-733.00) 8)  Bronchiectasis w/o Acute Exacerbation  (ICD-494.0) 9)  Rhinitis, Acute  (ICD-460) 10)  Obstructive Sleep Apnea  (ICD-327.23) 11)  Gerd  (ICD-530.81) 12)  Preventive Health Care  (ICD-V70.0) 13)  Overactive Bladder  (ICD-596.51) 14)  Lymphocytosis  (ICD-288.8) 15)  Hx of Pneumonia  (ICD-486) 16)  Memory Loss  (ICD-780.93) 17)  Fatigue  (ICD-780.79) 18)  Cough  (ICD-786.2) 19)  Back Pain, Acute  (ICD-724.5) 20)  Uti  (ICD-599.0) 21)  Shoulder Pain, Left  (ICD-719.41) 22)  Hyperlipidemia  (ICD-272.4) 23)  Hypertonicity of Bladder  (ICD-596.51) 24)  Glaucoma  (ICD-365.9) 25)  Diverticulitis, Hx of  (ICD-V12.79) 26)  Anemia-iron Deficiency  (ICD-280.9)  Medications Prior to Update: 1)  Citalopram Hydrobromide 10 Mg Tabs (Citalopram Hydrobromide) .... Take 1 Tablet By  Mouth Once A Day 2)  Acid Reducer Maximum Strength 150 Mg Tabs (Ranitidine Hcl) .... Take 1 Tablet By Mouth Two Times A Day 3)  Xalatan 0.005 %  Soln (Latanoprost) .Marland Kitchen.. 1 Gtt Ou Qd 4)  Combigan 0.2-0.5 %  Soln (Brimonidine Tartrate-Timolol) .Marland Kitchen.. 1 Gtt Ou Bid 5)  Melatonin 3 Mg Tabs (Melatonin) .... Take 1 Tab By Mouth At Bedtime 6)  Calcium-Vitamin D 500-125 Mg-Unit Tabs (Calcium-Vitamin D) .... Take 1 Tablet By Mouth Every Morning 7)  Multivitamins   Tabs (Multiple Vitamin) .... Take 1 Tablet By Mouth Once A Day 8)  Hydrocodone-Homatropine 5-1.5 Mg/62ml Syrp (Hydrocodone-Homatropine) .Marland Kitchen.. 1 Tsp By Mouth At Bedtime As Needed  Current Medications (verified): 1)  Citalopram Hydrobromide 10 Mg Tabs (Citalopram Hydrobromide) .... Take 1 Tablet By Mouth Once A Day 2)  Acid Reducer Maximum Strength 150 Mg Tabs (Ranitidine Hcl) .... Take 1 Tablet By Mouth Two Times A Day 3)  Xalatan 0.005 %  Soln (Latanoprost) .Marland Kitchen.. 1 Gtt Ou Qd 4)  Combigan 0.2-0.5 %  Soln (Brimonidine Tartrate-Timolol) .Marland Kitchen.. 1 Gtt Ou Bid 5)  Melatonin 3 Mg Tabs (Melatonin) .... Take 1 Tab By Mouth At Bedtime 6)  Calcium-Vitamin D 500-125 Mg-Unit Tabs (Calcium-Vitamin D) .... Take 1 Tablet By Mouth Every Morning 7)  Multivitamins   Tabs (Multiple Vitamin) .... Take 1 Tablet  By Mouth Once A Day 8)  Hydrocodone-Homatropine 5-1.5 Mg/53ml Syrp (Hydrocodone-Homatropine) .Marland Kitchen.. 1 Tsp By Mouth At Bedtime As Needed 9)  Avelox 400 Mg Tabs (Moxifloxacin Hcl) .Marland Kitchen.. 1 By Mouth Once Daily 10)  Omeprazole 20 Mg Cpdr (Omeprazole) .Marland Kitchen.. 1 By Mouth Once Daily  Allergies (verified): 1)  ! Doxycycline 2)  Vesicare (Solifenacin Succinate) 3)  Codeine 4)  Nsaids 5)  * Fexofenadine  Past History:  Past Medical History: Last updated: 03/25/2009 breast cancer remote, now with recent mild recurrance lymphoma - b-cell, non hodgkin  - Dr. Cephas Darby Anemia-iron deficiency Diverticulitis, hx of glaucoma hx of rectocele Hyperlipidemia hx of UGI  bleed secondary to NSAIDS OAB GERD OSA      - AHI 10 from [facility] 03/26/08      - CPAP 10 cm H2O from titration 08/03/08 bronchiectasis  Past Surgical History: Last updated: 03/05/2008 Hysterectomy Rotator cuff repair  Social History: Last updated: 03/19/2009 Never Smoked Alcohol use-no widower, using wood stove son died 10/09/2008  Risk Factors: Smoking Status: never (04/09/2007)  Review of Systems       all otherwise negative per pt   Physical Exam  General:  alert and well-developed.   Head:  normocephalic and atraumatic.   Eyes:  vision grossly intact, pupils equal, and pupils round.   Ears:  R ear normal and L ear normal.   Nose:  no external deformity and no nasal discharge.   Mouth:  pharyngeal erythema and fair dentition.   Neck:  supple and no masses.   Lungs:  normal respiratory effort and normal breath sounds.on the right, with few LLL crackles and decreased BS   Heart:  normal rate and regular rhythm.   Abdomen:  soft, non-tender, and normal bowel sounds.   Extremities:  no edema, no erythema    Impression & Recommendations:  Problem # 1:  PNEUMONIA (ICD-486)  for avelox for 10 days, cough med;  declines pulm referral  Her updated medication list for this problem includes:    Avelox 400 Mg Tabs (Moxifloxacin hcl) .Marland Kitchen... 1 by mouth once daily  Problem # 2:  HEMOPTYSIS UNSPECIFIED (ICD-786.30)  small volume, likely related to above; check cxr,  Orders: T-2 View CXR, Same Day (71020.5TC)  Problem # 3:  WHEEZING (ICD-786.07) Assessment: New benign on today's exam - does not appear to need lasix or steroid at this time as she had at her recent hospn  Problem # 4:  GERD (ICD-530.81)  Her updated medication list for this problem includes:    Acid Reducer Maximum Strength 150 Mg Tabs (Ranitidine hcl) .Marland Kitchen... Take 1 tablet by mouth two times a day    Omeprazole 20 Mg Cpdr (Omeprazole) .Marland Kitchen... 1 by mouth once daily treat as above, f/u any worsening signs or  symptoms   Complete Medication List: 1)  Citalopram Hydrobromide 10 Mg Tabs (Citalopram hydrobromide) .... Take 1 tablet by mouth once a day 2)  Acid Reducer Maximum Strength 150 Mg Tabs (Ranitidine hcl) .... Take 1 tablet by mouth two times a day 3)  Xalatan 0.005 % Soln (Latanoprost) .Marland Kitchen.. 1 gtt ou qd 4)  Combigan 0.2-0.5 % Soln (Brimonidine tartrate-timolol) .Marland Kitchen.. 1 gtt ou bid 5)  Melatonin 3 Mg Tabs (Melatonin) .... Take 1 tab by mouth at bedtime 6)  Calcium-vitamin D 500-125 Mg-unit Tabs (Calcium-vitamin d) .... Take 1 tablet by mouth every morning 7)  Multivitamins Tabs (Multiple vitamin) .... Take 1 tablet by mouth once a day 8)  Hydrocodone-homatropine 5-1.5 Mg/10ml Syrp (Hydrocodone-homatropine) .Marland KitchenMarland KitchenMarland Kitchen  1 tsp by mouth at bedtime as needed 9)  Avelox 400 Mg Tabs (Moxifloxacin hcl) .Marland Kitchen.. 1 by mouth once daily 10)  Omeprazole 20 Mg Cpdr (Omeprazole) .Marland Kitchen.. 1 by mouth once daily  Patient Instructions: 1)  Please take all new medications as prescribed 2)  Continue all previous medications as before this visit  3)  Please go to Radiology in the basement level for your X-Ray today  4)  Please schedule a follow-up appointment oct 2011 with CPX labs for Yearly exam, or sooner if needed 5)  call if the blood in the sputum persists, as you will likely need to be seen per pulmonary Prescriptions: OMEPRAZOLE 20 MG CPDR (OMEPRAZOLE) 1 by mouth once daily  #90 x 3   Entered and Authorized by:   Corwin Levins MD   Signed by:   Corwin Levins MD on 06/16/2009   Method used:   Print then Give to Patient   RxID:   [id number] AVELOX 400 MG TABS (MOXIFLOXACIN HCL) 1 by mouth once daily  #10 x 0   Entered and Authorized by:   Corwin Levins MD   Signed by:   Corwin Levins MD on 06/16/2009   Method used:   Print then Give to Patient   RxID:   [id number] HYDROCODONE-HOMATROPINE 5-1.5 MG/5ML SYRP (HYDROCODONE-HOMATROPINE) 1 tsp by mouth at bedtime as needed  #6 oz x 1   Entered and Authorized by:    Corwin Levins MD   Signed by:   Corwin Levins MD on 06/16/2009   Method used:   Print then Give to Patient   RxID:   [id number]

## 2010-07-01 NOTE — Progress Notes (Signed)
Summary: nos appt  Phone Note Call from Patient   Caller: [email or web address]  Call For: Ayce Pietrzyk Summary of Call: [facility] nos from 7/22 to 7/27 @ 2:15p, pt states she wasn't aware of appt. Initial call taken by: Darletta Moll,  December 21, 2009 9:59 AM

## 2010-07-01 NOTE — Assessment & Plan Note (Signed)
Summary: URINE FREQUENCY MORE THAN USUAL--STC   Vital Signs:  Patient profile:   77 year old female Height:      62 inches Weight:      153.50 pounds O2 Sat:      97 % on Room air Temp:     97 degrees F oral Pulse rate:   74 / minute BP sitting:   90 / 52  (left arm) Cuff size:   regular  Vitals Entered ByZella Ball Ewing (August 21, 2009 3:30 PM)  O2 Flow:  Room air CC: Urinary problems/RE   Primary Care Provider:  Corwin Levins MD  CC:  Urinary problems/RE.  History of Present Illness: here wtih 2 days acute on chronic incontiennce and mild urinary frequency; pt states no gross blood, dysuria, flank pain, no n/v, no chills or fever, or abd pain.  No dizziness or orthostasis, falls or confusion.  No other complaints - Pt denies CP, sob, doe, wheezing, orthopnea, pnd, worsening LE edema, palps, dizziness or syncope , though has hx of pna, and pancytopenia.   Has mild hx of OAB, last UTI years ago.    Problems Prior to Update: 1)  Microscopic Hematuria  (ICD-599.72) 2)  Frequency, Urinary  (ICD-788.41) 3)  Wheezing  (ICD-786.07) 4)  Hemoptysis Unspecified  (ICD-786.30) 5)  Pneumonia  (ICD-486) 6)  Bronchiectasis  (ICD-494.0) 7)  Pancytopenia  (ICD-284.1) 8)  Dizziness and Giddiness  (ICD-780.4) 9)  Osteoporosis  (ICD-733.00) 10)  Bronchiectasis w/o Acute Exacerbation  (ICD-494.0) 11)  Rhinitis, Acute  (ICD-460) 12)  Obstructive Sleep Apnea  (ICD-327.23) 13)  Gerd  (ICD-530.81) 14)  Preventive Health Care  (ICD-V70.0) 15)  Overactive Bladder  (ICD-596.51) 16)  Lymphocytosis  (ICD-288.8) 17)  Hx of Pneumonia  (ICD-486) 18)  Memory Loss  (ICD-780.93) 19)  Fatigue  (ICD-780.79) 20)  Cough  (ICD-786.2) 21)  Back Pain, Acute  (ICD-724.5) 22)  Uti  (ICD-599.0) 23)  Shoulder Pain, Left  (ICD-719.41) 24)  Hyperlipidemia  (ICD-272.4) 25)  Hypertonicity of Bladder  (ICD-596.51) 26)  Glaucoma  (ICD-365.9) 27)  Diverticulitis, Hx of  (ICD-V12.79) 28)  Anemia-iron Deficiency   (ICD-280.9)  Medications Prior to Update: 1)  Citalopram Hydrobromide 10 Mg Tabs (Citalopram Hydrobromide) .... Take 1 Tablet By Mouth Once A Day 2)  Acid Reducer Maximum Strength 150 Mg Tabs (Ranitidine Hcl) .... Take 1 Tablet By Mouth Two Times A Day 3)  Xalatan 0.005 %  Soln (Latanoprost) .Marland Kitchen.. 1 Gtt Ou Qd 4)  Combigan 0.2-0.5 %  Soln (Brimonidine Tartrate-Timolol) .Marland Kitchen.. 1 Gtt Ou Bid 5)  Melatonin 3 Mg Tabs (Melatonin) .... Take 1 Tab By Mouth At Bedtime 6)  Calcium-Vitamin D 500-125 Mg-Unit Tabs (Calcium-Vitamin D) .... Take 1 Tablet By Mouth Every Morning 7)  Multivitamins   Tabs (Multiple Vitamin) .... Take 1 Tablet By Mouth Once A Day 8)  Hydrocodone-Homatropine 5-1.5 Mg/53ml Syrp (Hydrocodone-Homatropine) .Marland Kitchen.. 1 Tsp By Mouth At Bedtime As Needed 9)  Avelox 400 Mg Tabs (Moxifloxacin Hcl) .Marland Kitchen.. 1 By Mouth Once Daily 10)  Omeprazole 20 Mg Cpdr (Omeprazole) .Marland Kitchen.. 1 By Mouth Once Daily  Current Medications (verified): 1)  Citalopram Hydrobromide 10 Mg Tabs (Citalopram Hydrobromide) .... Take 1 Tablet By Mouth Once A Day 2)  Acid Reducer Maximum Strength 150 Mg Tabs (Ranitidine Hcl) .... Take 1 Tablet By Mouth Two Times A Day 3)  Xalatan 0.005 %  Soln (Latanoprost) .Marland Kitchen.. 1 Gtt Ou Qd 4)  Combigan 0.2-0.5 %  Soln (Brimonidine Tartrate-Timolol) .Marland Kitchen.. 1 Gtt Ou  Bid 5)  Melatonin 3 Mg Tabs (Melatonin) .... Take 1 Tab By Mouth At Bedtime 6)  Calcium-Vitamin D 500-125 Mg-Unit Tabs (Calcium-Vitamin D) .... Take 1 Tablet By Mouth Every Morning 7)  Multivitamins   Tabs (Multiple Vitamin) .... Take 1 Tablet By Mouth Once A Day 8)  Hydrocodone-Homatropine 5-1.5 Mg/39ml Syrp (Hydrocodone-Homatropine) .Marland Kitchen.. 1 Tsp By Mouth At Bedtime As Needed 9)  Omeprazole 20 Mg Cpdr (Omeprazole) .Marland Kitchen.. 1 By Mouth Once Daily 10)  Ciprofloxacin Hcl 500 Mg Tabs (Ciprofloxacin Hcl) .Marland Kitchen.. 1po Two Times A Day  Allergies (verified): 1)  ! Doxycycline 2)  Vesicare (Solifenacin Succinate) 3)  Codeine 4)  Nsaids 5)  *  Fexofenadine  Past History:  Past Medical History: Last updated: 03/25/2009 breast cancer remote, now with recent mild recurrance lymphoma - b-cell, non hodgkin  - Dr. Cephas Darby Anemia-iron deficiency Diverticulitis, hx of glaucoma hx of rectocele Hyperlipidemia hx of UGI bleed secondary to NSAIDS OAB GERD OSA      - AHI 10 from [facility] 03/26/08      - CPAP 10 cm H2O from titration 08/03/08 bronchiectasis  Past Surgical History: Last updated: 03/05/2008 Hysterectomy Rotator cuff repair  Social History: Last updated: 03/19/2009 Never Smoked Alcohol use-no widower, using wood stove son died 10/17/2008  Risk Factors: Smoking Status: never (04/09/2007)  Review of Systems       all otherwise negative per pt -    Physical Exam  General:  alert and overweight-appearing. , not ill appearing Head:  normocephalic and atraumatic.   Eyes:  vision grossly intact, pupils equal, and pupils round.   Ears:  R ear normal and L ear normal.   Nose:  no external deformity and no nasal discharge.   Mouth:  no gingival abnormalities and pharynx pink and moist.   Neck:  supple and no masses.   Lungs:  normal respiratory effort and normal breath sounds.   Heart:  normal rate and regular rhythm.   Abdomen:  soft and normal bowel sounds.  , with mild low mid abd tender Extremities:  no edema, no erythema  Psych:  slightly anxious.     Impression & Recommendations:  Problem # 1:  FREQUENCY, URINARY (ICD-788.41) suspcious for uncomplicated UTI - treat as above, f/u any worsening signs or symptoms  - with cipro course, check urine studies  Orders: T-Culture, Urine ([id number]) TLB-Udip w/ Micro ([id number]-[id number])  Problem # 2:  MICROSCOPIC HEMATURIA (ICD-599.72)  The following medications were removed from the medication list:    Avelox 400 Mg Tabs (Moxifloxacin hcl) .Marland Kitchen... 1 by mouth once daily Her updated medication list for this problem includes:    Ciprofloxacin Hcl 500 Mg Tabs  (Ciprofloxacin hcl) .Marland Kitchen... 1po two times a day likely due to above, to consider hematuria w/u if cx neg  Problem # 3:  OVERACTIVE BLADDER (ICD-596.51) declines recent symptoms or need for vesicare or similar med  Complete Medication List: 1)  Citalopram Hydrobromide 10 Mg Tabs (Citalopram hydrobromide) .... Take 1 tablet by mouth once a day 2)  Acid Reducer Maximum Strength 150 Mg Tabs (Ranitidine hcl) .... Take 1 tablet by mouth two times a day 3)  Xalatan 0.005 % Soln (Latanoprost) .Marland Kitchen.. 1 gtt ou qd 4)  Combigan 0.2-0.5 % Soln (Brimonidine tartrate-timolol) .Marland Kitchen.. 1 gtt ou bid 5)  Melatonin 3 Mg Tabs (Melatonin) .... Take 1 tab by mouth at bedtime 6)  Calcium-vitamin D 500-125 Mg-unit Tabs (Calcium-vitamin d) .... Take 1 tablet by mouth every morning 7)  Multivitamins Tabs (  Multiple vitamin) .... Take 1 tablet by mouth once a day 8)  Hydrocodone-homatropine 5-1.5 Mg/34ml Syrp (Hydrocodone-homatropine) .Marland Kitchen.. 1 tsp by mouth at bedtime as needed 9)  Omeprazole 20 Mg Cpdr (Omeprazole) .Marland Kitchen.. 1 by mouth once daily 10)  Ciprofloxacin Hcl 500 Mg Tabs (Ciprofloxacin hcl) .Marland Kitchen.. 1po two times a day  Other Orders: UA Dipstick W/ Micro (manual) ([id number])  Patient Instructions: 1)  Please take all new medications as prescribed - the antibiotic was sent to [facility] - on [address] 2)  Continue all previous medications as before this visit  3)  call the number on the blue card for results in a few days 4)  remember if the culture is negative, we may need to have you see urology to see if there is other problem to cause the symptoms and blood  5)  Please schedule a follow-up appointment in 6 months with CPX labs Prescriptions: CIPROFLOXACIN HCL 500 MG TABS (CIPROFLOXACIN HCL) 1po two times a day  #20 x 0   Entered and Authorized by:   Corwin Levins MD   Signed by:   Corwin Levins MD on 08/21/2009   Method used:   Electronically to        [facility] [address] [id number]* (retail)       [address] [place], [place]  [address]       Ph: [phone]       Fax: [phone]   RxID:   [id number]   Laboratory Results   Urine Tests    Routine Urinalysis   Color: yellow Appearance: Clear Glucose: negative   (Normal Range: Negative) Bilirubin: negative   (Normal Range: Negative) Ketone: negative   (Normal Range: Negative) Spec. Gravity: <1.005   (Normal Range: 1.003-1.035) Blood: moderate   (Normal Range: Negative) pH: 7.5   (Normal Range: 5.0-8.0) Protein: negative   (Normal Range: Negative) Urobilinogen: 0.2   (Normal Range: 0-1) Nitrite: negative   (Normal Range: Negative) Leukocyte Esterace: negative   (Normal Range: Negative)

## 2010-07-01 NOTE — Assessment & Plan Note (Signed)
Summary: Gastroenterology  TOWANDA HORNSTEIN MR#:  [id number] Page #  NAME:  Audrey Spencer, Audrey Spencer  OFFICE NO:  [id number]  DATE:  03/28/05  DOB:  [birth year]  The patient is a very nice, 77 year old white female who has a history of lymphoma, currently in remission.    She was hospitalized 6 weeks ago with acute upper GI bleed, most likely due to taking naproxen.  Her hemoglobin at that time was 7 grams.  She required transfusion of 2 units of packed cells.  Upper endoscopy did not show any definite site of bleeding.  Naproxen was discontinued.  A small bower capsule endoscopy was done as an outpatient, which showed duodenal erosions, otherwise a normal small bowel.  Patient was discharged on iron supplements and Protonix 40 mg a day.  Her hemoglobin on March 17, 2005 in Dr. Cato Mulligan' office was 11.1, hematocrit 32.2, with MCV of 95.   She denies any abdominal pain.  Her level of energy has improved.   PHYSICAL EXAMINATION:  Blood pressure 114/80; pulse 68; weight 153 pounds.  She is alert, oriented, in no distress.  Lungs are clear to auscultation.  COR with normal S1 and normal S2.  Abdomen is soft, nontender, with normoactive bowel sounds.  No distention.  Rectal exam shows dark, Hemoccult-negative stool.    IMPRESSION:  A 77 year old white female, status post upper GI bleed, due to nonsteroidal anti-inflammatory drug gastropathy - currently not bleeding off her nonsteroidal anti-inflammatory drugs.    PLAN:  1.    Patient should not return back to nonsteroidal anti-inflammatory drugs.  Instead, she could take Tylenol during the day or Vicodin, as per Dr. Cato Mulligan, at night.  2.   Continue Protonix 40 mg a day.  If her insurance does not cover it, she may switch back to Pepcid.  3.   CBC in next 6 weeks.  Patient will be seeing Dr. Cato Mulligan on May 17, 2005, at that time.  She will continue her iron until she sees Dr. Cato Mulligan.  I do not really need to see her in a GI followup.      [name]. [name],  M.D. [id number] cc:  Dr. Birdie Sons  D:  03/28/05; T:  ; Job [phone]

## 2010-07-01 NOTE — Letter (Signed)
Summary: Colonoscopy Letter  [facility]  [address] [place], [place] [address]   Phone: [phone]  Fax: [phone]      February 04, 2010 MRN: [id number]   [facility] [address] [place], [place]  [address]   Dear Ms. Keys,   According to your medical record, it is time for you to schedule a Colonoscopy. The American Cancer Society recommends this procedure as a method to detect early colon cancer. Patients with a family history of colon cancer, or a personal history of colon polyps or inflammatory bowel disease are at increased risk.  This letter has beeen generated based on the recommendations made at the time of your procedure. If you feel that in your particular situation this may no longer apply, please contact our office.  Please call our office at [phone] to schedule this appointment or to update your records at your earliest convenience.  Thank you for cooperating with [place] to provide you with the very best care possible.   Sincerely,  [name]. [name], M.D.  [facility] [phone]

## 2010-07-01 NOTE — Progress Notes (Signed)
Summary: Results  Phone Note Call from Patient   Summary of Call: Patient is requesting xray results Initial call taken by: Lamar Sprinkles,  July 02, 2007 6:43 PM  Follow-up for Phone Call        slight compression of anterior superior aspect of L4 - age indeterminate, degenerative disk disease at lower lumbar region rated as mild, facet arthritic changes rated as mild. For on-going pain or neuro changes will need follow-up OV> Follow-up by: Jacques Navy MD,  July 03, 2007 1:01 PM  Additional Follow-up for Phone Call Additional follow up Details #1::        Pt informed will call office if needs apt Additional Follow-up by: Lamar Sprinkles,  July 03, 2007 7:50 PM

## 2010-07-01 NOTE — Procedures (Signed)
Summary: COLON   Colonoscopy  Procedure date:  03/11/2005  Findings:      Location:  [facility].   Patient Name: Audrey Spencer, Audrey Spencer MRN:  Procedure Procedures: Colonoscopy CPT: [phone].  Personnel: Endoscopist: Venita Lick. Russella Dar, MD, Clementeen Graham.  Exam Location: Exam performed in Endoscopy Suite. Inpatient-ward  Patient Consent: Procedure, Alternatives, Risks and Benefits discussed, consent obtained, from patient.  Indications  Evaluation of: Anemia with low iron saturation. Normocytic. Positive fecal occult blood test  Increased Risk Screening: Personal history of breast cancer.  Comments: H/O lymphoma History  Current Medications: Patient is not currently taking Coumadin.  Pre-Exam Physical: Performed Mar 11, 2005. Cardio-pulmonary exam, Rectal exam, HEENT exam , Abdominal exam, Mental status exam WNL.  Exam Exam: Extent of exam reached: Terminal Ileum, extent intended: Terminal Ileum.  The cecum was identified by appendiceal orifice and IC valve. Colon retroflexion performed. ASA Classification: II. Tolerance: excellent.  Monitoring: Pulse and BP monitoring, Oximetry used. Supplemental O2 given.  Colon Prep Used Golytely for colon prep. Prep results: good.  Sedation Meds: Patient assessed and found to be appropriate for moderate (conscious) sedation. Fentanyl 60.0 given IV. Versed 6 mg. given IV.  Findings - DIVERTICULOSIS: Descending Colon to Sigmoid Colon. Not bleeding. ICD9: Diverticulosis: 562.10.  NORMAL EXAM: Terminal Ileum to Splenic Flexure.  HEMORRHOIDS: Internal. Size: Small. Not bleeding. Not thrombosed. ICD9: Hemorrhoids, Internal: 455.0.   Assessment  Diagnoses: 562.10: Diverticulosis.  455.0: Hemorrhoids, Internal.   Events  Unplanned Interventions: No intervention was required.  Unplanned Events: There were no complications. Plans  Post Exam Instructions: Post sedation instructions given.  Patient Education: Patient given  standard instructions for: Diverticulosis. Hemorrhoids.  Disposition: After procedure patient sent to recovery. After recovery patient sent back to hospital.  Scheduling/Referral: [facility], to Dora L. [name], MD, next available OP appt.,  Office Visit, to Dora L. [name], MD, around Apr 01, 2005.    This report was created from the original endoscopy report, which was reviewed and signed by the above listed endoscopist.

## 2010-07-02 ENCOUNTER — Ambulatory Visit: Admit: 2010-07-02 | Payer: Self-pay | Admitting: Internal Medicine

## 2010-07-02 ENCOUNTER — Encounter (INDEPENDENT_AMBULATORY_CARE_PROVIDER_SITE_OTHER): Payer: Self-pay | Admitting: *Deleted

## 2010-07-02 ENCOUNTER — Ambulatory Visit (INDEPENDENT_AMBULATORY_CARE_PROVIDER_SITE_OTHER)
Admission: RE | Admit: 2010-07-02 | Discharge: 2010-07-02 | Disposition: A | Discharge status: Home / Self Care | Payer: Medicare Other | Source: Ambulatory Visit

## 2010-07-02 ENCOUNTER — Ambulatory Visit (INDEPENDENT_AMBULATORY_CARE_PROVIDER_SITE_OTHER): Payer: MEDICARE | Admitting: Internal Medicine

## 2010-07-02 ENCOUNTER — Other Ambulatory Visit: Payer: Medicare Other

## 2010-07-02 ENCOUNTER — Encounter: Payer: Self-pay | Admitting: Internal Medicine

## 2010-07-02 ENCOUNTER — Inpatient Hospital Stay: Admission: RE | Admit: 2010-07-02 | Payer: MEDICARE | Source: Ambulatory Visit

## 2010-07-02 ENCOUNTER — Other Ambulatory Visit: Payer: Self-pay | Admitting: Internal Medicine

## 2010-07-02 DIAGNOSIS — Z0000 Encounter for general adult medical examination without abnormal findings: Secondary | ICD-10-CM

## 2010-07-02 DIAGNOSIS — D509 Iron deficiency anemia, unspecified: Secondary | ICD-10-CM

## 2010-07-02 DIAGNOSIS — M810 Age-related osteoporosis without current pathological fracture: Secondary | ICD-10-CM

## 2010-07-02 DIAGNOSIS — E785 Hyperlipidemia, unspecified: Secondary | ICD-10-CM

## 2010-07-02 LAB — LIPID PANEL
Cholesterol: 237 mg/dL — ABNORMAL HIGH (ref 0–200)
Total CHOL/HDL Ratio: 2
Triglycerides: 243 mg/dL — ABNORMAL HIGH (ref 0.0–149.0)
VLDL: 48.6 mg/dL — ABNORMAL HIGH (ref 0.0–40.0)

## 2010-07-02 LAB — CBC WITH DIFFERENTIAL/PLATELET
Basophils Absolute: 0 10*3/uL (ref 0.0–0.1)
HCT: 33.6 % — ABNORMAL LOW (ref 36.0–46.0)
Hemoglobin: 11.4 g/dL — ABNORMAL LOW (ref 12.0–15.0)
Lymphs Abs: 1.4 10*3/uL (ref 0.7–4.0)
MCV: 98.5 fl (ref 78.0–100.0)
Monocytes Absolute: 0.5 10*3/uL (ref 0.1–1.0)
Monocytes Relative: 11.3 % (ref 3.0–12.0)
Neutro Abs: 2.2 10*3/uL (ref 1.4–7.7)
Platelets: 162 10*3/uL (ref 150.0–400.0)
RDW: 16.4 % — ABNORMAL HIGH (ref 11.5–14.6)

## 2010-07-02 LAB — BASIC METABOLIC PANEL
BUN: 15 mg/dL (ref 6–23)
CO2: 33 mEq/L — ABNORMAL HIGH (ref 19–32)
Chloride: 99 mEq/L (ref 96–112)
Glucose, Bld: 82 mg/dL (ref 70–99)
Potassium: 4.2 mEq/L (ref 3.5–5.1)
Sodium: 138 mEq/L (ref 135–145)

## 2010-07-02 LAB — HEPATIC FUNCTION PANEL
ALT: 18 U/L (ref 0–35)
AST: 26 U/L (ref 0–37)
Albumin: 3.8 g/dL (ref 3.5–5.2)
Total Bilirubin: 0.3 mg/dL (ref 0.3–1.2)

## 2010-07-02 LAB — LDL CHOLESTEROL, DIRECT: Direct LDL: 126.3 mg/dL

## 2010-07-02 LAB — URINALYSIS, ROUTINE W REFLEX MICROSCOPIC
Leukocytes, UA: NEGATIVE
Specific Gravity, Urine: 1.01 (ref 1.000–1.030)
Urobilinogen, UA: 0.2 (ref 0.0–1.0)
pH: 6 (ref 5.0–8.0)

## 2010-07-02 LAB — IBC PANEL
Saturation Ratios: 10.6 % — ABNORMAL LOW (ref 20.0–50.0)
Transferrin: 335.6 mg/dL (ref 212.0–360.0)

## 2010-07-02 NOTE — Letter (Signed)
[facility]   Imported By: Lennie Odor 12/15/2009 11:04:41  _____________________________________________________________________  External Attachment:    Type:   Image     Comment:   External Document

## 2010-07-06 ENCOUNTER — Other Ambulatory Visit: Payer: Self-pay | Admitting: Internal Medicine

## 2010-07-07 NOTE — Miscellaneous (Signed)
Summary: BONE DENSITY  Clinical Lists Changes  Orders: Added new Test order of T-Lumbar Vertebral Assessment (77082) - Signed 

## 2010-07-07 NOTE — Miscellaneous (Signed)
Summary: Orders Update  Clinical Lists Changes  Orders: Added new Referral order of Urology Referral (Urology) - Signed 

## 2010-07-15 ENCOUNTER — Encounter (INDEPENDENT_AMBULATORY_CARE_PROVIDER_SITE_OTHER): Payer: Self-pay | Admitting: *Deleted

## 2010-07-15 NOTE — Assessment & Plan Note (Signed)
Summary: YEARLY MEDICARE PHYSICAL-LB   Vital Signs:  Patient profile:   77 year old female Height:      63 inches Weight:      162.25 pounds BMI:     28.85 O2 Sat:      94 % on Room air Temp:     97.6 degrees F oral Pulse rate:   72 / minute BP sitting:   118 / 62  (left arm) Cuff size:   large  Vitals Entered By: Margaret Pyle, CMA (July 02, 2010 2:01 PM)  O2 Flow:  Room air  CC: yearly f/u -  Comments Labs done by Dr Cyndie Chime   Primary Care Provider:  Corwin Levins MD  CC:  yearly f/u - .  History of Present Illness: here for f/u - overall doing ok, Pt denies CP, worsening sob, doe, wheezing, orthopnea, pnd, worsening LE edema, palps, dizziness or syncope  Pt denies new neuro symptoms such as headache, facial or extremity weakness  Pt denies polydipsia, polyuria   Overall good compliance with meds, trying to follow low chol diet, wt stable, little excercise however Overall good compliance with meds, and good tolerability.  No fever, wt loss, night sweats, loss of appetite or other constitutional symptoms  Denies worsening depressive symptoms, suicidal ideation, or panic.   Pt states good ability with ADL's, low fall risk, home safety reviewed and adequate, no significant change in hearing or vision, trying to follow lower chol diet, and occasionally active only with regular excercise.   Problems Prior to Update: 1)  Preventive Health Care  (ICD-V70.0) 2)  Hx of Rectocele Without Mention of Uterine Prolapse  (ICD-618.04) 3)  Sleep Apnea  (ICD-780.57) 4)  Internal Hemorrhoids  (ICD-455.0) 5)  Gastrointestinal Hemorrhage, Hx of  (ICD-V12.79) 6)  Diverticulosis, Colon  (ICD-562.10) 7)  Skin Lesion  (ICD-709.9) 8)  Open Wound of Foot Except Toe Alone Complicated  (ICD-892.1) 9)  Foreign Body, Foot  (ICD-917.6) 10)  Dyspnea  (ICD-786.05) 11)  Personal History of Malignant Neoplasm of Breast  (ICD-V10.3) 12)  Cll  (ICD-204.10) 13)  Microscopic Hematuria   (ICD-599.72) 14)  Frequency, Urinary  (ICD-788.41) 15)  Wheezing  (ICD-786.07) 16)  Hemoptysis Unspecified  (ICD-786.30) 17)  Pneumonia  (ICD-486) 18)  Bronchiectasis  (ICD-494.0) 19)  Pancytopenia  (ICD-284.1) 20)  Dizziness and Giddiness  (ICD-780.4) 21)  Osteoporosis  (ICD-733.00) 22)  Bronchiectasis w/o Acute Exacerbation  (ICD-494.0) 23)  Rhinitis, Acute  (ICD-460) 24)  Obstructive Sleep Apnea  (ICD-327.23) 25)  Gerd  (ICD-530.81) 26)  Preventive Health Care  (ICD-V70.0) 27)  Overactive Bladder  (ICD-596.51) 28)  Lymphocytosis  (ICD-288.8) 29)  Hx of Pneumonia  (ICD-486) 30)  Memory Loss  (ICD-780.93) 31)  Fatigue  (ICD-780.79) 32)  Cough  (ICD-786.2) 33)  Back Pain, Acute  (ICD-724.5) 34)  Uti  (ICD-599.0) 35)  Shoulder Pain, Left  (ICD-719.41) 36)  Hyperlipidemia  (ICD-272.4) 37)  Hypertonicity of Bladder  (ICD-596.51) 38)  Glaucoma  (ICD-365.9) 39)  Diverticulitis, Hx of  (ICD-V12.79) 40)  Anemia-iron Deficiency  (ICD-280.9)  Medications Prior to Update: 1)  Citalopram Hydrobromide 10 Mg Tabs (Citalopram Hydrobromide) .... Take 1 Tablet By Mouth Once A Day 2)  Xalatan 0.005 %  Soln (Latanoprost) .Marland Kitchen.. 1 Gtt Ou Qd 3)  Combigan 0.2-0.5 %  Soln (Brimonidine Tartrate-Timolol) .Marland Kitchen.. 1 Gtt Ou Bid 4)  Melatonin 3 Mg Tabs (Melatonin) .... Take 1 Tab By Mouth At Bedtime 5)  Multivitamins   Tabs (Multiple Vitamin) .... Take  1 Tablet By Mouth Once A Day 6)  Hydrocodone-Homatropine 5-1.5 Mg/44ml Syrp (Hydrocodone-Homatropine) .Marland Kitchen.. 1 Tsp By Mouth Q 6 Hrs As Needed Cough 7)  Omeprazole 20 Mg Cpdr (Omeprazole) .... Take 1 Capsule By Mouth Two Times A Day 30 Minutes Before Meal 8)  Doxycycline Hyclate 100 Mg Caps (Doxycycline Hyclate) .Marland Kitchen.. 1po Two Times A Day 9)  Aspirin 81 Mg Tbec (Aspirin) .... Take 1 Tablet By Mouth Once A Day 10)  Alphagan P 0.1 % Soln (Brimonidine Tartrate) .... Apply 1 Drop Each Eye Twice Daily 11)  Oscal 500/200 D-3 500-200 Mg-Unit Tabs (Calcium Carbonate-Vitamin  D) .... Take 1 Tablet By Mouth Once A Day 12)  Colace 100 Mg Caps (Docusate Sodium) .... Take One Capsule By Mouth Once Daily 13)  Ferrous Sulfate 325 (65 Fe) Mg Tabs (Ferrous Sulfate) .... Take 1 Tablet By Mouth Two Times A Day 14)  Claritin 10 Mg Tabs (Loratadine) .... Take 1 Tablet By Mouth Once A Day 15)  Timolol Solution (Unknown Concentration) .... Apply To Each Eye Twice Daily  Current Medications (verified): 1)  Citalopram Hydrobromide 10 Mg Tabs (Citalopram Hydrobromide) .... Take 1 Tablet By Mouth Once A Day 2)  Xalatan 0.005 %  Soln (Latanoprost) .Marland Kitchen.. 1 Gtt Ou Qd 3)  Combigan 0.2-0.5 %  Soln (Brimonidine Tartrate-Timolol) .Marland Kitchen.. 1 Gtt Ou Bid 4)  Melatonin 3 Mg Tabs (Melatonin) .... Take 1 Tab By Mouth At Bedtime 5)  Multivitamins   Tabs (Multiple Vitamin) .... Take 1 Tablet By Mouth Once A Day 6)  Hydrocodone-Homatropine 5-1.5 Mg/34ml Syrp (Hydrocodone-Homatropine) .Marland Kitchen.. 1 Tsp By Mouth Q 6 Hrs As Needed Cough 7)  Omeprazole 20 Mg Cpdr (Omeprazole) .... Take 1 Capsule By Mouth Two Times A Day 30 Minutes Before Meal 8)  Doxycycline Hyclate 100 Mg Caps (Doxycycline Hyclate) .Marland Kitchen.. 1po Two Times A Day 9)  Aspirin 81 Mg Tbec (Aspirin) .... Take 1 Tablet By Mouth Once A Day 10)  Alphagan P 0.1 % Soln (Brimonidine Tartrate) .... Apply 1 Drop Each Eye Twice Daily 11)  Oscal 500/200 D-3 500-200 Mg-Unit Tabs (Calcium Carbonate-Vitamin D) .... Take 1 Tablet By Mouth Once A Day 12)  Colace 100 Mg Caps (Docusate Sodium) .... Take One Capsule By Mouth Once Daily 13)  Ferrous Sulfate 325 (65 Fe) Mg Tabs (Ferrous Sulfate) .... Take 1 Tablet By Mouth Two Times A Day 14)  Claritin 10 Mg Tabs (Loratadine) .... Take 1 Tablet By Mouth Once A Day 15)  Timolol Solution (Unknown Concentration) .... Apply To Each Eye Twice Daily  Allergies (verified): 1)  ! 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 Cephas Darby Anemia-iron deficiency Diverticulitis, hx of glaucoma hx of rectocele Hyperlipidemia hx of UGI bleed secondary to NSAIDS OAB GERD OSA      - AHI 10 from [facility] 03/26/08      - CPAP 10 cm H2O from titration 08/03/08 bronchiectasis  Past Surgical History: Last updated: 04/15/2010 Hysterectomy Right Rotator cuff repair Breast lumpectomy  Family History: Last updated: 04/15/2010 sister with breast cancer brother with esophagus cancer daughter with alcoholism No FH of Colon Cancer:  Social History: Last updated: 03/01/2010 Never Smoked Alcohol use-no widower, using wood stove son died 10-16-2008 Drug use-no  Risk Factors: Alcohol Use: 0 (01/07/2010)  Risk Factors: Smoking Status: never (01/07/2010)  Review of Systems  The patient denies anorexia, fever, vision loss, decreased hearing, hoarseness, chest pain, syncope, dyspnea on exertion, peripheral edema, prolonged cough, headaches, hemoptysis, abdominal pain, melena, hematochezia, severe indigestion/heartburn, hematuria, muscle weakness, suspicious skin lesions, transient blindness, difficulty walking, depression, unusual weight change, abnormal bleeding, enlarged lymph nodes, and angioedema.         all otherwise negative per pt -    Physical Exam  General:  alert, well-developed, well-nourished, and well-hydrated.   Head:  normocephalic and atraumatic.   Eyes:  vision grossly intact, pupils equal, and pupils round.   Ears:  R ear normal and L ear normal.   Nose:  no external deformity and no nasal discharge.   Mouth:  no gingival abnormalities and pharynx pink and moist.   Neck:  supple, full ROM, and no masses.   Lungs:  Normal respiratory effort, chest expands symmetrically. Lungs are clear to auscultation, no crackles or wheezes. Heart:  Normal rate and regular rhythm.  S1 and S2 normal without gallop, murmur, click, rub or other extra sounds. Abdomen:  soft, normal bowel sounds, and no distention.   Msk:  no acute joint tenderness and no joint swelling.   Extremities:  No clubbing, cyanosis, edema, or deformity noted with normal full range of motion of all joints.   Neurologic:  strength normal in all extremities and gait normal.  but frail, but only somewhat unsteady Skin:  color normal and no rashes.   Psych:  not depressed appearing and slightly anxious.     Impression & Recommendations:  Problem # 1:  Preventive Health Care (ICD-V70.0) Overall doing well, age appropriate education and counseling updated, referral for preventive services and immunizations addressed, dietary counseling and smoking status adressed , most recent labs reviewed I have personally reviewed and have noted 1.The patient's medical and social history 2.Their use of alcohol, tobacco or illicit drugs 3.Their current medications and supplements 4. Functional ability including ADL's, fall risk, home safety risk, hearing & visual impairment  5.Diet and physical activities 6.Evidence for depression or mood disorders The patients weight, height, BMI  have been recorded in the chart I have made referrals, counseling and provided education to the patient based review of the above  Orders: TLB-BMP (Basic Metabolic Panel-BMET) (80048-METABOL) TLB-CBC Platelet - w/Differential (85025-CBCD) TLB-Hepatic/Liver Function Pnl (80076-HEPATIC) TLB-Lipid Panel (80061-LIPID) TLB-TSH (Thyroid Stimulating Hormone) (84443-TSH) TLB-Udip ONLY (81003-UDIP)  Complete Medication List: 1)  Citalopram Hydrobromide 10 Mg Tabs (Citalopram hydrobromide) .... Take 1 tablet by mouth once a day 2)  Xalatan 0.005 % Soln (Latanoprost) .Marland Kitchen.. 1 gtt ou qd 3)  Combigan 0.2-0.5 % Soln (Brimonidine tartrate-timolol) .Marland Kitchen.. 1 gtt ou bid 4)  Melatonin 3 Mg Tabs (Melatonin) .... Take 1 tab by mouth at bedtime 5)   Multivitamins Tabs (Multiple vitamin) .... Take 1 tablet by mouth once a day 6)  Hydrocodone-homatropine 5-1.5 Mg/56ml Syrp (Hydrocodone-homatropine) .Marland Kitchen.. 1 tsp by mouth q 6 hrs as needed cough 7)  Omeprazole 20 Mg Cpdr (Omeprazole) .... Take 1 capsule by mouth two times a day 30 minutes before meal 8)  Doxycycline Hyclate 100 Mg Caps (Doxycycline hyclate) .Marland Kitchen.. 1po two times a day 9)  Aspirin 81 Mg Tbec (Aspirin) .... Take 1 tablet by mouth once a day 10)  Alphagan P 0.1 % Soln (Brimonidine tartrate) .... Apply 1 drop each eye twice daily 11)  Oscal 500/200 D-3 500-200 Mg-unit Tabs (Calcium carbonate-vitamin d) .... Take 1 tablet by mouth once a  day 12)  Colace 100 Mg Caps (Docusate sodium) .... Take one capsule by mouth once daily 13)  Ferrous Sulfate 325 (65 Fe) Mg Tabs (Ferrous sulfate) .... Take 1 tablet by mouth two times a day 14)  Claritin 10 Mg Tabs (Loratadine) .... Take 1 tablet by mouth once a day 15)  Timolol Solution (unknown Concentration)  .... Apply to each eye twice daily  Other Orders: T-Bone Densitometry (16109) TLB-IBC Pnl (Iron/FE;Transferrin) (83550-IBC)  Patient Instructions: 1)  please schedule the bone density test (? even today) 2)  Please go to the Lab in the basement for your blood and/or urine tests today 3)  Please call the number on the [facility] Card for results of your testing 4)  Please schedule a follow-up appointment in 1 year, or sooner if needed   Orders Added: 1)  T-Bone Densitometry [77080] 2)  TLB-BMP (Basic Metabolic Panel-BMET) [80048-METABOL] 3)  TLB-CBC Platelet - w/Differential [85025-CBCD] 4)  TLB-Hepatic/Liver Function Pnl [80076-HEPATIC] 5)  TLB-Lipid Panel [80061-LIPID] 6)  TLB-TSH (Thyroid Stimulating Hormone) [84443-TSH] 7)  TLB-Udip ONLY [81003-UDIP] 8)  TLB-IBC Pnl (Iron/FE;Transferrin) [83550-IBC] 9)  Est. Patient 65& > [60454]

## 2010-07-19 ENCOUNTER — Encounter (HOSPITAL_BASED_OUTPATIENT_CLINIC_OR_DEPARTMENT_OTHER): Payer: MEDICARE | Admitting: Oncology

## 2010-07-19 ENCOUNTER — Other Ambulatory Visit: Payer: Self-pay | Admitting: Oncology

## 2010-07-19 ENCOUNTER — Encounter: Payer: Self-pay | Admitting: Internal Medicine

## 2010-07-19 ENCOUNTER — Encounter: Payer: MEDICARE | Admitting: Oncology

## 2010-07-19 DIAGNOSIS — C8590 Non-Hodgkin lymphoma, unspecified, unspecified site: Secondary | ICD-10-CM

## 2010-07-19 DIAGNOSIS — C9110 Chronic lymphocytic leukemia of B-cell type not having achieved remission: Secondary | ICD-10-CM

## 2010-07-19 LAB — MORPHOLOGY: PLT EST: DECREASED

## 2010-07-19 LAB — CBC & DIFF AND RETIC
Basophils Absolute: 0 10*3/uL (ref 0.0–0.1)
EOS%: 5 % (ref 0.0–7.0)
Eosinophils Absolute: 0.2 10*3/uL (ref 0.0–0.5)
LYMPH%: 26.2 % (ref 14.0–49.7)
MCH: 31.2 pg (ref 25.1–34.0)
MCV: 97.3 fL (ref 79.5–101.0)
MONO%: 12 % (ref 0.0–14.0)
Platelets: 128 10*3/uL — ABNORMAL LOW (ref 145–400)
RBC: 3.75 10*6/uL (ref 3.70–5.45)
RDW: 14.4 % (ref 11.2–14.5)
Retic Ct Abs: 34.88 10*3/uL (ref 18.30–72.70)

## 2010-07-21 NOTE — Letter (Addendum)
Summary: Primary Care Consult Scheduled Letter  [facility]  [address] [place], [place] [address]   Phone: [phone]  Fax: [phone]      07/15/2010 MRN: [id number]  [facility] [address] [place], [place]  [address]  [place]    Dear Ms. Garrod,      We have scheduled an appointment for you.  At the recommendation of Dr.James Jonny Ruiz, we have scheduled you a consult with [facility] Dr Patsi Sears on March 6,2012 at 3:30 pm. Their address is [address] [place] [address] [facility] The office phone number is [phone].  If this appointment day and time is not convenient for you, please feel free to call the office of the doctor you are being referred to at the number listed above and reschedule the appointment.     It is important for you to keep your scheduled appointments. We are here to make sure you are given good patient care. If you have questions or you have made changes to your appointment, please notify us at  [phone]       , ask for Debra.     Thank you,  Patient Care Coordinator  [facility]

## 2010-07-28 ENCOUNTER — Ambulatory Visit (HOSPITAL_COMMUNITY)
Admission: RE | Admit: 2010-07-28 | Discharge: 2010-07-28 | Disposition: A | Discharge status: Home / Self Care | Payer: Medicare Other | Source: Ambulatory Visit | Attending: Oncology | Admitting: Oncology

## 2010-07-28 DIAGNOSIS — C8590 Non-Hodgkin lymphoma, unspecified, unspecified site: Secondary | ICD-10-CM

## 2010-07-28 DIAGNOSIS — IMO0002 Reserved for concepts with insufficient information to code with codable children: Secondary | ICD-10-CM | POA: Insufficient documentation

## 2010-07-28 DIAGNOSIS — M47814 Spondylosis without myelopathy or radiculopathy, thoracic region: Secondary | ICD-10-CM | POA: Insufficient documentation

## 2010-07-28 DIAGNOSIS — K5730 Diverticulosis of large intestine without perforation or abscess without bleeding: Secondary | ICD-10-CM | POA: Insufficient documentation

## 2010-07-28 DIAGNOSIS — C8589 Other specified types of non-Hodgkin lymphoma, extranodal and solid organ sites: Secondary | ICD-10-CM | POA: Insufficient documentation

## 2010-07-28 DIAGNOSIS — Q619 Cystic kidney disease, unspecified: Secondary | ICD-10-CM | POA: Insufficient documentation

## 2010-07-28 DIAGNOSIS — C9590 Leukemia, unspecified not having achieved remission: Secondary | ICD-10-CM | POA: Insufficient documentation

## 2010-07-28 MED ORDER — IOHEXOL 300 MG/ML  SOLN
100.0000 mL | Freq: Once | INTRAMUSCULAR | Status: AC | PRN
  Administered 2010-07-28: 100 mL via INTRAVENOUS

## 2010-08-03 ENCOUNTER — Encounter: Payer: Self-pay | Admitting: Internal Medicine

## 2010-08-10 NOTE — Consult Note (Signed)
Summary: [facility]  [facility]   Imported By: Lennie Odor 08/06/2010 11:26:37  _____________________________________________________________________  External Attachment:    Type:   Image     Comment:   External Document

## 2010-08-10 NOTE — Letter (Signed)
Summary: [facility]  [facility]   Imported By: Lennie Odor 08/03/2010 15:41:47  _____________________________________________________________________  External Attachment:    Type:   Image     Comment:   External Document

## 2010-08-11 LAB — RETICULOCYTES
RBC.: 3.28 MIL/uL — ABNORMAL LOW (ref 3.87–5.11)
Retic Ct Pct: 1.6 % (ref 0.4–3.1)

## 2010-08-11 LAB — CARDIAC PANEL(CRET KIN+CKTOT+MB+TROPI)
CK, MB: 5.6 ng/mL — ABNORMAL HIGH (ref 0.3–4.0)
CK, MB: 7.4 ng/mL (ref 0.3–4.0)
Relative Index: 1.3 (ref 0.0–2.5)
Relative Index: 1.9 (ref 0.0–2.5)
Total CK: 504 U/L — ABNORMAL HIGH (ref 7–177)
Troponin I: 0.06 ng/mL (ref 0.00–0.06)

## 2010-08-11 LAB — CBC
HCT: 22.4 % — ABNORMAL LOW (ref 36.0–46.0)
HCT: 25.7 % — ABNORMAL LOW (ref 36.0–46.0)
HCT: 27.5 % — ABNORMAL LOW (ref 36.0–46.0)
Hemoglobin: 8.6 g/dL — ABNORMAL LOW (ref 12.0–15.0)
MCH: 30.6 pg (ref 26.0–34.0)
MCH: 30.8 pg (ref 26.0–34.0)
MCH: 30.8 pg (ref 26.0–34.0)
MCHC: 33.2 g/dL (ref 30.0–36.0)
MCHC: 33.4 g/dL (ref 30.0–36.0)
MCHC: 33.5 g/dL (ref 30.0–36.0)
MCV: 91.7 fL (ref 78.0–100.0)
MCV: 92.5 fL (ref 78.0–100.0)
MCV: 92.9 fL (ref 78.0–100.0)
Platelets: 140 10*3/uL — ABNORMAL LOW (ref 150–400)
Platelets: 148 10*3/uL — ABNORMAL LOW (ref 150–400)
Platelets: 161 10*3/uL (ref 150–400)
Platelets: 173 10*3/uL (ref 150–400)
Platelets: 181 10*3/uL (ref 150–400)
RBC: 2.78 MIL/uL — ABNORMAL LOW (ref 3.87–5.11)
RDW: 16.2 % — ABNORMAL HIGH (ref 11.5–15.5)
RDW: 16.3 % — ABNORMAL HIGH (ref 11.5–15.5)
RDW: 16.3 % — ABNORMAL HIGH (ref 11.5–15.5)
RDW: 16.5 % — ABNORMAL HIGH (ref 11.5–15.5)
RDW: 16.8 % — ABNORMAL HIGH (ref 11.5–15.5)
WBC: 10.1 10*3/uL (ref 4.0–10.5)
WBC: 2.7 10*3/uL — ABNORMAL LOW (ref 4.0–10.5)

## 2010-08-11 LAB — URINE CULTURE
Colony Count: NO GROWTH
Culture  Setup Time: 201110190810
Culture: NO GROWTH

## 2010-08-11 LAB — GLUCOSE, CAPILLARY
Glucose-Capillary: 107 mg/dL — ABNORMAL HIGH (ref 70–99)
Glucose-Capillary: 122 mg/dL — ABNORMAL HIGH (ref 70–99)
Glucose-Capillary: 130 mg/dL — ABNORMAL HIGH (ref 70–99)
Glucose-Capillary: 137 mg/dL — ABNORMAL HIGH (ref 70–99)
Glucose-Capillary: 145 mg/dL — ABNORMAL HIGH (ref 70–99)
Glucose-Capillary: 163 mg/dL — ABNORMAL HIGH (ref 70–99)
Glucose-Capillary: 167 mg/dL — ABNORMAL HIGH (ref 70–99)
Glucose-Capillary: 168 mg/dL — ABNORMAL HIGH (ref 70–99)
Glucose-Capillary: 227 mg/dL — ABNORMAL HIGH (ref 70–99)
Glucose-Capillary: 99 mg/dL (ref 70–99)

## 2010-08-11 LAB — COMPREHENSIVE METABOLIC PANEL
Albumin: 2.7 g/dL — ABNORMAL LOW (ref 3.5–5.2)
Alkaline Phosphatase: 75 U/L (ref 39–117)
BUN: 13 mg/dL (ref 6–23)
Chloride: 97 mEq/L (ref 96–112)
Creatinine, Ser: 0.62 mg/dL (ref 0.4–1.2)
Glucose, Bld: 133 mg/dL — ABNORMAL HIGH (ref 70–99)
Total Bilirubin: 0.4 mg/dL (ref 0.3–1.2)
Total Protein: 6 g/dL (ref 6.0–8.3)

## 2010-08-11 LAB — HEMOGLOBIN A1C
Hgb A1c MFr Bld: 6.2 % — ABNORMAL HIGH (ref <5.7)
Mean Plasma Glucose: 131 mg/dL — ABNORMAL HIGH (ref <117)

## 2010-08-11 LAB — BASIC METABOLIC PANEL
BUN: 20 mg/dL (ref 6–23)
BUN: 9 mg/dL (ref 6–23)
Calcium: 8.4 mg/dL (ref 8.4–10.5)
Calcium: 8.7 mg/dL (ref 8.4–10.5)
Chloride: 96 mEq/L (ref 96–112)
Creatinine, Ser: 0.54 mg/dL (ref 0.4–1.2)
Creatinine, Ser: 0.63 mg/dL (ref 0.4–1.2)
GFR calc Af Amer: >60 mL/min (ref >60)
GFR calc non Af Amer: >60 mL/min (ref >60)
Glucose, Bld: 127 mg/dL — ABNORMAL HIGH (ref 70–99)

## 2010-08-11 LAB — URINALYSIS, ROUTINE W REFLEX MICROSCOPIC
Bilirubin Urine: NEGATIVE
Ketones, ur: NEGATIVE mg/dL
Nitrite: NEGATIVE
Protein, ur: 30 mg/dL — AB
Urobilinogen, UA: 0.2 mg/dL (ref 0.0–1.0)

## 2010-08-11 LAB — BRAIN NATRIURETIC PEPTIDE: Pro B Natriuretic peptide (BNP): 156 pg/mL — ABNORMAL HIGH (ref 0.0–100.0)

## 2010-08-11 LAB — DIFFERENTIAL
Basophils Absolute: 0 10*3/uL (ref 0.0–0.1)
Eosinophils Absolute: 0.1 10*3/uL (ref 0.0–0.7)
Eosinophils Absolute: 0.1 10*3/uL (ref 0.0–0.7)
Lymphocytes Relative: 25 % (ref 12–46)
Lymphs Abs: 0.8 10*3/uL (ref 0.7–4.0)
Monocytes Relative: 13 % — ABNORMAL HIGH (ref 3–12)
Neutrophils Relative %: 52 % (ref 43–77)
Neutrophils Relative %: 57 % (ref 43–77)

## 2010-08-11 LAB — FOLATE: Folate: 18.8 ng/mL

## 2010-08-11 LAB — VITAMIN B12: Vitamin B-12: 1251 pg/mL — ABNORMAL HIGH (ref 211–911)

## 2010-08-11 LAB — IRON AND TIBC: TIBC: 328 ug/dL (ref 250–470)

## 2010-08-11 LAB — HEMOGLOBIN AND HEMATOCRIT, BLOOD: Hemoglobin: 7.9 g/dL — ABNORMAL LOW (ref 12.0–15.0)

## 2010-08-15 LAB — BASIC METABOLIC PANEL
BUN: 16 mg/dL (ref 6–23)
BUN: 18 mg/dL (ref 6–23)
CO2: 34 mEq/L — ABNORMAL HIGH (ref 19–32)
Chloride: 96 mEq/L (ref 96–112)
Chloride: 98 mEq/L (ref 96–112)
Creatinine, Ser: 0.7 mg/dL (ref 0.4–1.2)
GFR calc Af Amer: >60 mL/min (ref >60)
Potassium: 4.4 mEq/L (ref 3.5–5.1)

## 2010-08-15 LAB — DIFFERENTIAL
Eosinophils Absolute: 0 10*3/uL (ref 0.0–0.7)
Eosinophils Relative: 1 % (ref 0–5)
Lymphs Abs: 0.4 10*3/uL — ABNORMAL LOW (ref 0.7–4.0)
Monocytes Absolute: 0.5 10*3/uL (ref 0.1–1.0)
Monocytes Relative: 14 % — ABNORMAL HIGH (ref 3–12)

## 2010-08-15 LAB — CBC
HCT: 28.4 % — ABNORMAL LOW (ref 36.0–46.0)
Hemoglobin: 9.5 g/dL — ABNORMAL LOW (ref 12.0–15.0)
MCV: 93.2 fL (ref 78.0–100.0)
RBC: 3.05 MIL/uL — ABNORMAL LOW (ref 3.87–5.11)
WBC: 3.8 10*3/uL — ABNORMAL LOW (ref 4.0–10.5)

## 2010-08-30 LAB — CBC
HCT: 33.7 % — ABNORMAL LOW (ref 36.0–46.0)
Hemoglobin: 10 g/dL — ABNORMAL LOW (ref 12.0–15.0)
Hemoglobin: 9.7 g/dL — ABNORMAL LOW (ref 12.0–15.0)
MCHC: 33.1 g/dL (ref 30.0–36.0)
MCHC: 33.6 g/dL (ref 30.0–36.0)
MCV: 93.2 fL (ref 78.0–100.0)
MCV: 93.2 fL (ref 78.0–100.0)
MCV: 93.7 fL (ref 78.0–100.0)
Platelets: 129 10*3/uL — ABNORMAL LOW (ref 150–400)
RBC: 2.88 MIL/uL — ABNORMAL LOW (ref 3.87–5.11)
RBC: 3.08 MIL/uL — ABNORMAL LOW (ref 3.87–5.11)
RBC: 3.16 MIL/uL — ABNORMAL LOW (ref 3.87–5.11)
RBC: 3.6 MIL/uL — ABNORMAL LOW (ref 3.87–5.11)
RDW: 15.4 % (ref 11.5–15.5)
WBC: 3.5 10*3/uL — ABNORMAL LOW (ref 4.0–10.5)
WBC: 3.6 10*3/uL — ABNORMAL LOW (ref 4.0–10.5)

## 2010-08-30 LAB — COMPREHENSIVE METABOLIC PANEL
ALT: 14 U/L (ref 0–35)
AST: 19 U/L (ref 0–37)
AST: 24 U/L (ref 0–37)
Alkaline Phosphatase: 62 U/L (ref 39–117)
BUN: 15 mg/dL (ref 6–23)
CO2: 30 mEq/L (ref 19–32)
Calcium: 8.1 mg/dL — ABNORMAL LOW (ref 8.4–10.5)
Calcium: 8.9 mg/dL (ref 8.4–10.5)
Creatinine, Ser: 0.61 mg/dL (ref 0.4–1.2)
GFR calc Af Amer: >60 mL/min (ref >60)
GFR calc Af Amer: >60 mL/min (ref >60)
GFR calc non Af Amer: >60 mL/min (ref >60)
Glucose, Bld: 190 mg/dL — ABNORMAL HIGH (ref 70–99)
Potassium: 3.2 mEq/L — ABNORMAL LOW (ref 3.5–5.1)
Sodium: 134 mEq/L — ABNORMAL LOW (ref 135–145)
Total Bilirubin: 0.5 mg/dL (ref 0.3–1.2)
Total Protein: 5.2 g/dL — ABNORMAL LOW (ref 6.0–8.3)

## 2010-08-30 LAB — DIFFERENTIAL
Basophils Absolute: 0 10*3/uL (ref 0.0–0.1)
Eosinophils Absolute: 0.1 10*3/uL (ref 0.0–0.7)
Eosinophils Absolute: 0.1 10*3/uL (ref 0.0–0.7)
Lymphocytes Relative: 15 % (ref 12–46)
Lymphocytes Relative: 9 % — ABNORMAL LOW (ref 12–46)
Lymphs Abs: 0.5 10*3/uL — ABNORMAL LOW (ref 0.7–4.0)
Lymphs Abs: 0.5 10*3/uL — ABNORMAL LOW (ref 0.7–4.0)
Lymphs Abs: 0.8 10*3/uL (ref 0.7–4.0)
Monocytes Absolute: 0.3 10*3/uL (ref 0.1–1.0)
Monocytes Relative: 10 % (ref 3–12)
Neutro Abs: 2.6 10*3/uL (ref 1.7–7.7)
Neutro Abs: 6.9 10*3/uL (ref 1.7–7.7)
Neutrophils Relative %: 71 % (ref 43–77)
Neutrophils Relative %: 74 % (ref 43–77)
Neutrophils Relative %: 84 % — ABNORMAL HIGH (ref 43–77)

## 2010-08-30 LAB — URINALYSIS, ROUTINE W REFLEX MICROSCOPIC
Bilirubin Urine: NEGATIVE
Glucose, UA: NEGATIVE mg/dL
Glucose, UA: NEGATIVE mg/dL
Ketones, ur: 15 mg/dL — AB
Ketones, ur: NEGATIVE mg/dL
Nitrite: NEGATIVE
Protein, ur: NEGATIVE mg/dL
Specific Gravity, Urine: 1.017 (ref 1.005–1.030)
pH: 5.5 (ref 5.0–8.0)

## 2010-08-30 LAB — POCT CARDIAC MARKERS
CKMB, poc: <1 ng/mL — ABNORMAL LOW (ref 1.0–8.0)
Myoglobin, poc: 96.6 ng/mL (ref 12–200)
Troponin i, poc: <0.05 ng/mL (ref 0.00–0.09)

## 2010-08-30 LAB — TSH: TSH: 0.817 u[IU]/mL (ref 0.350–4.500)

## 2010-08-30 LAB — BASIC METABOLIC PANEL
BUN: 9 mg/dL (ref 6–23)
Calcium: 8 mg/dL — ABNORMAL LOW (ref 8.4–10.5)
Creatinine, Ser: 0.53 mg/dL (ref 0.4–1.2)
GFR calc Af Amer: >60 mL/min (ref >60)
GFR calc non Af Amer: >60 mL/min (ref >60)

## 2010-08-30 LAB — URINE CULTURE: Colony Count: NO GROWTH

## 2010-08-30 LAB — CULTURE, RESPIRATORY W GRAM STAIN

## 2010-08-30 LAB — URINE MICROSCOPIC-ADD ON

## 2010-08-30 LAB — CARDIAC PANEL(CRET KIN+CKTOT+MB+TROPI)
CK, MB: 3.4 ng/mL (ref 0.3–4.0)
CK, MB: 3.9 ng/mL (ref 0.3–4.0)
Total CK: 78 U/L (ref 7–177)
Troponin I: 0.02 ng/mL (ref 0.00–0.06)

## 2010-08-30 LAB — CULTURE, BLOOD (ROUTINE X 2)

## 2010-08-30 LAB — CK TOTAL AND CKMB (NOT AT ARMC): Relative Index: INVALID (ref 0.0–2.5)

## 2010-09-06 LAB — DIFFERENTIAL
Basophils Absolute: 0 10*3/uL (ref 0.0–0.1)
Eosinophils Absolute: 0.2 10*3/uL (ref 0.0–0.7)
Eosinophils Absolute: 0.2 10*3/uL (ref 0.0–0.7)
Eosinophils Relative: 4 % (ref 0–5)
Lymphs Abs: 0.6 10*3/uL — ABNORMAL LOW (ref 0.7–4.0)
Lymphs Abs: 0.8 10*3/uL (ref 0.7–4.0)
Neutro Abs: 18.9 10*3/uL — ABNORMAL HIGH (ref 1.7–7.7)

## 2010-09-06 LAB — COMPREHENSIVE METABOLIC PANEL
AST: 26 U/L (ref 0–37)
Albumin: 3.6 g/dL (ref 3.5–5.2)
Alkaline Phosphatase: 206 U/L — ABNORMAL HIGH (ref 39–117)
Chloride: 98 mEq/L (ref 96–112)
GFR calc Af Amer: >60 mL/min (ref >60)
Potassium: 4.1 mEq/L (ref 3.5–5.1)
Total Bilirubin: 0.5 mg/dL (ref 0.3–1.2)

## 2010-09-06 LAB — BASIC METABOLIC PANEL
BUN: 11 mg/dL (ref 6–23)
Chloride: 96 mEq/L (ref 96–112)
Glucose, Bld: 118 mg/dL — ABNORMAL HIGH (ref 70–99)
Potassium: 4.3 mEq/L (ref 3.5–5.1)

## 2010-09-06 LAB — CBC
HCT: 35.3 % — ABNORMAL LOW (ref 36.0–46.0)
HCT: 36.5 % (ref 36.0–46.0)
MCV: 98.9 fL (ref 78.0–100.0)
Platelets: 120 10*3/uL — ABNORMAL LOW (ref 150–400)
Platelets: 146 10*3/uL — ABNORMAL LOW (ref 150–400)
WBC: 20.7 10*3/uL — ABNORMAL HIGH (ref 4.0–10.5)
WBC: 5.5 10*3/uL (ref 4.0–10.5)

## 2010-09-13 ENCOUNTER — Encounter (HOSPITAL_BASED_OUTPATIENT_CLINIC_OR_DEPARTMENT_OTHER): Payer: Medicare Other | Admitting: Oncology

## 2010-09-13 ENCOUNTER — Other Ambulatory Visit: Payer: Self-pay | Admitting: Oncology

## 2010-09-13 DIAGNOSIS — C9110 Chronic lymphocytic leukemia of B-cell type not having achieved remission: Secondary | ICD-10-CM

## 2010-09-13 LAB — COMPREHENSIVE METABOLIC PANEL
Alkaline Phosphatase: 58 U/L (ref 39–117)
BUN: 24 mg/dL — ABNORMAL HIGH (ref 6–23)
Creatinine, Ser: 0.63 mg/dL (ref 0.40–1.20)
Glucose, Bld: 59 mg/dL — ABNORMAL LOW (ref 70–99)
Total Bilirubin: 0.4 mg/dL (ref 0.3–1.2)

## 2010-09-13 LAB — CBC WITH DIFFERENTIAL/PLATELET
BASO%: 0.6 % (ref 0.0–2.0)
EOS%: 4.4 % (ref 0.0–7.0)
LYMPH%: 29.7 % (ref 14.0–49.7)
MCH: 32.3 pg (ref 25.1–34.0)
MCHC: 34.2 g/dL (ref 31.5–36.0)
MCV: 94.6 fL (ref 79.5–101.0)
MONO#: 0.5 10*3/uL (ref 0.1–0.9)
MONO%: 11.7 % (ref 0.0–14.0)
Platelets: 135 10*3/uL — ABNORMAL LOW (ref 145–400)
RBC: 3.63 10*6/uL — ABNORMAL LOW (ref 3.70–5.45)
WBC: 3.9 10*3/uL (ref 3.9–10.3)

## 2010-09-13 LAB — MORPHOLOGY

## 2010-09-13 LAB — LACTATE DEHYDROGENASE: LDH: 161 U/L (ref 94–250)

## 2010-09-21 IMAGING — CR DG LUMBAR SPINE COMPLETE 4+V
5 series · 5 of 5 positions shown · non-contrast
Comparison: CT 11/04/2008.

CLINICAL DATA: Back pain.  Thoracic spine pain.

LUMBAR SPINE - COMPLETE 4+ VIEW

[t l-spine a.p.]
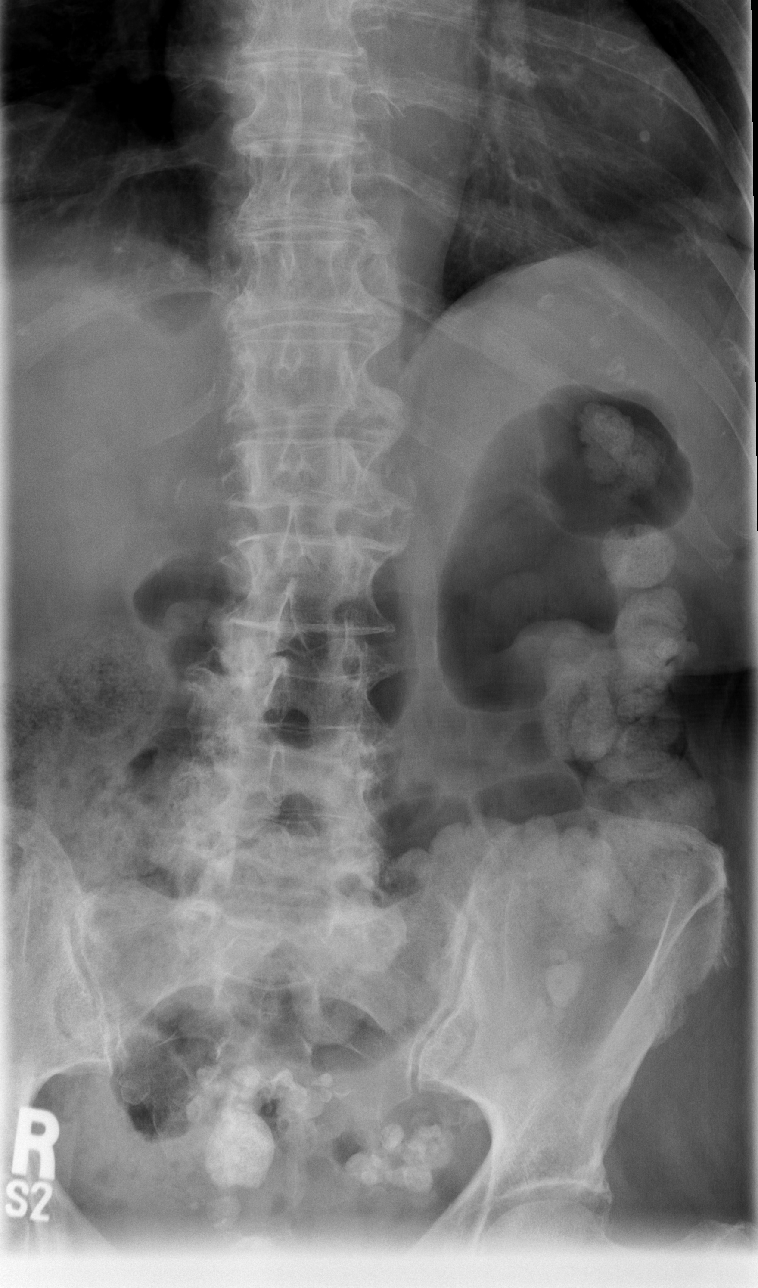

[t l-spine oblique exposure (1 of 2)]
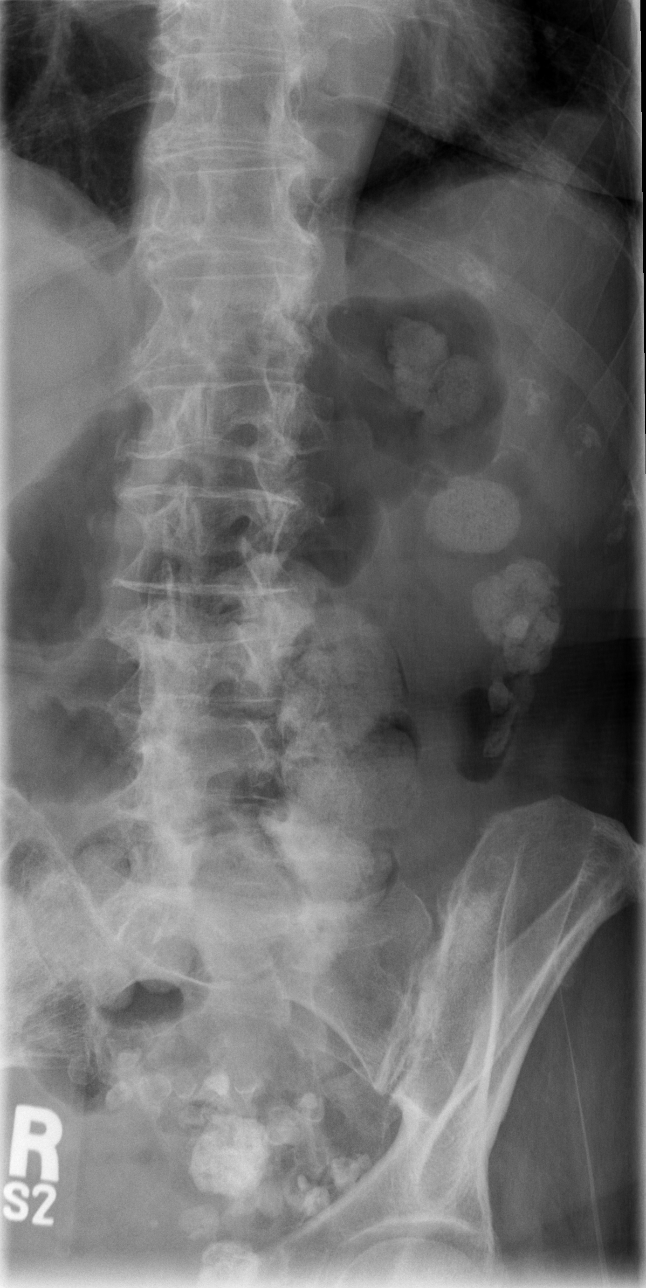

[t l-spine oblique exposure (2 of 2)]
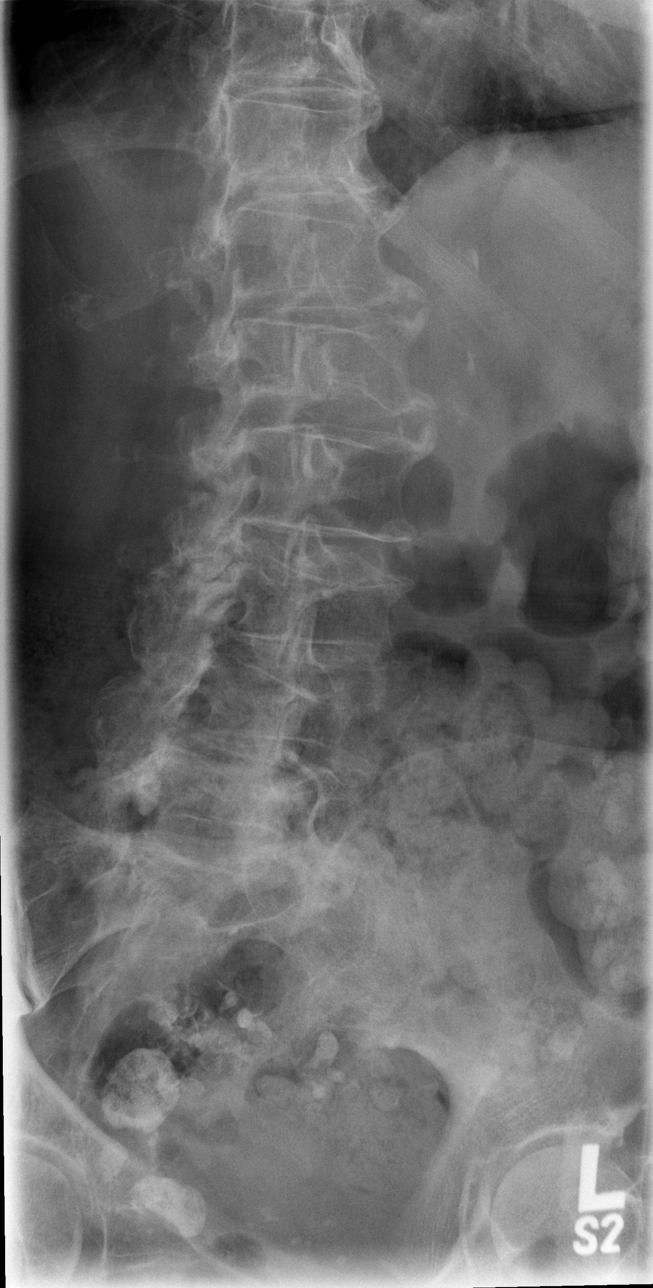

[t l-spine lat]
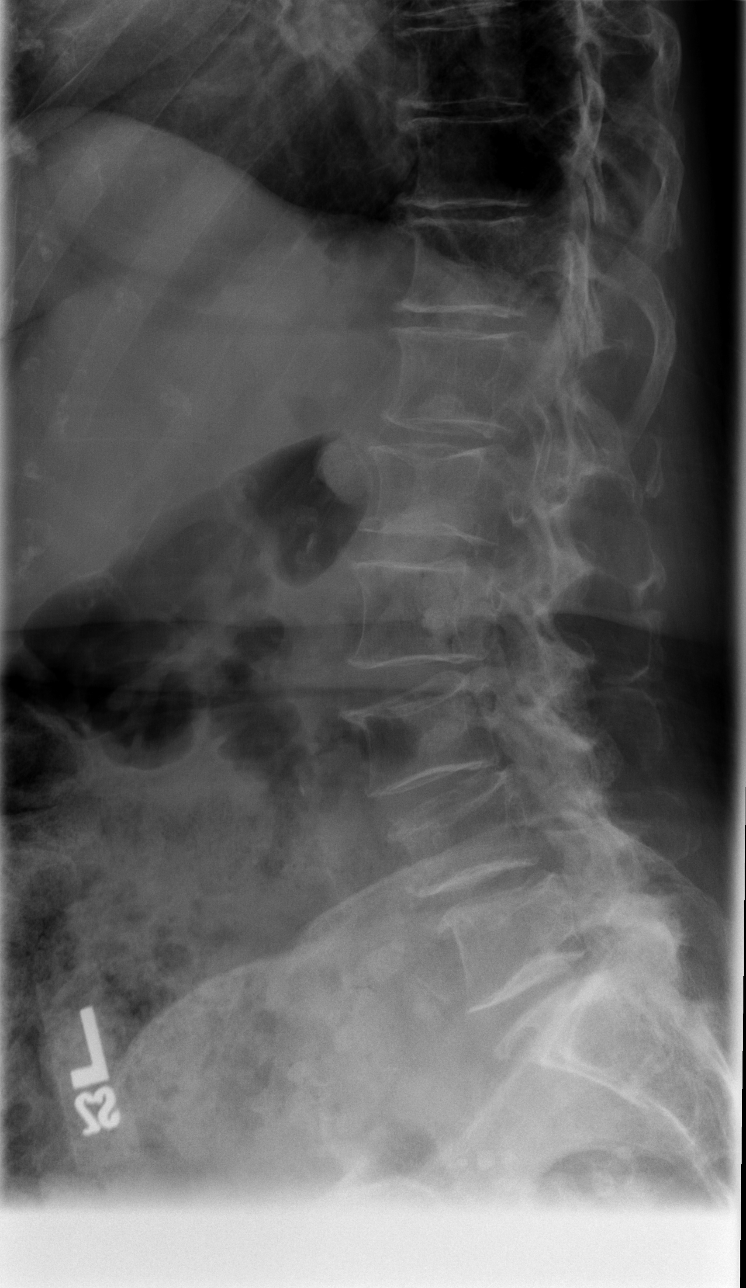

[t l-spine l5-s1 spot]
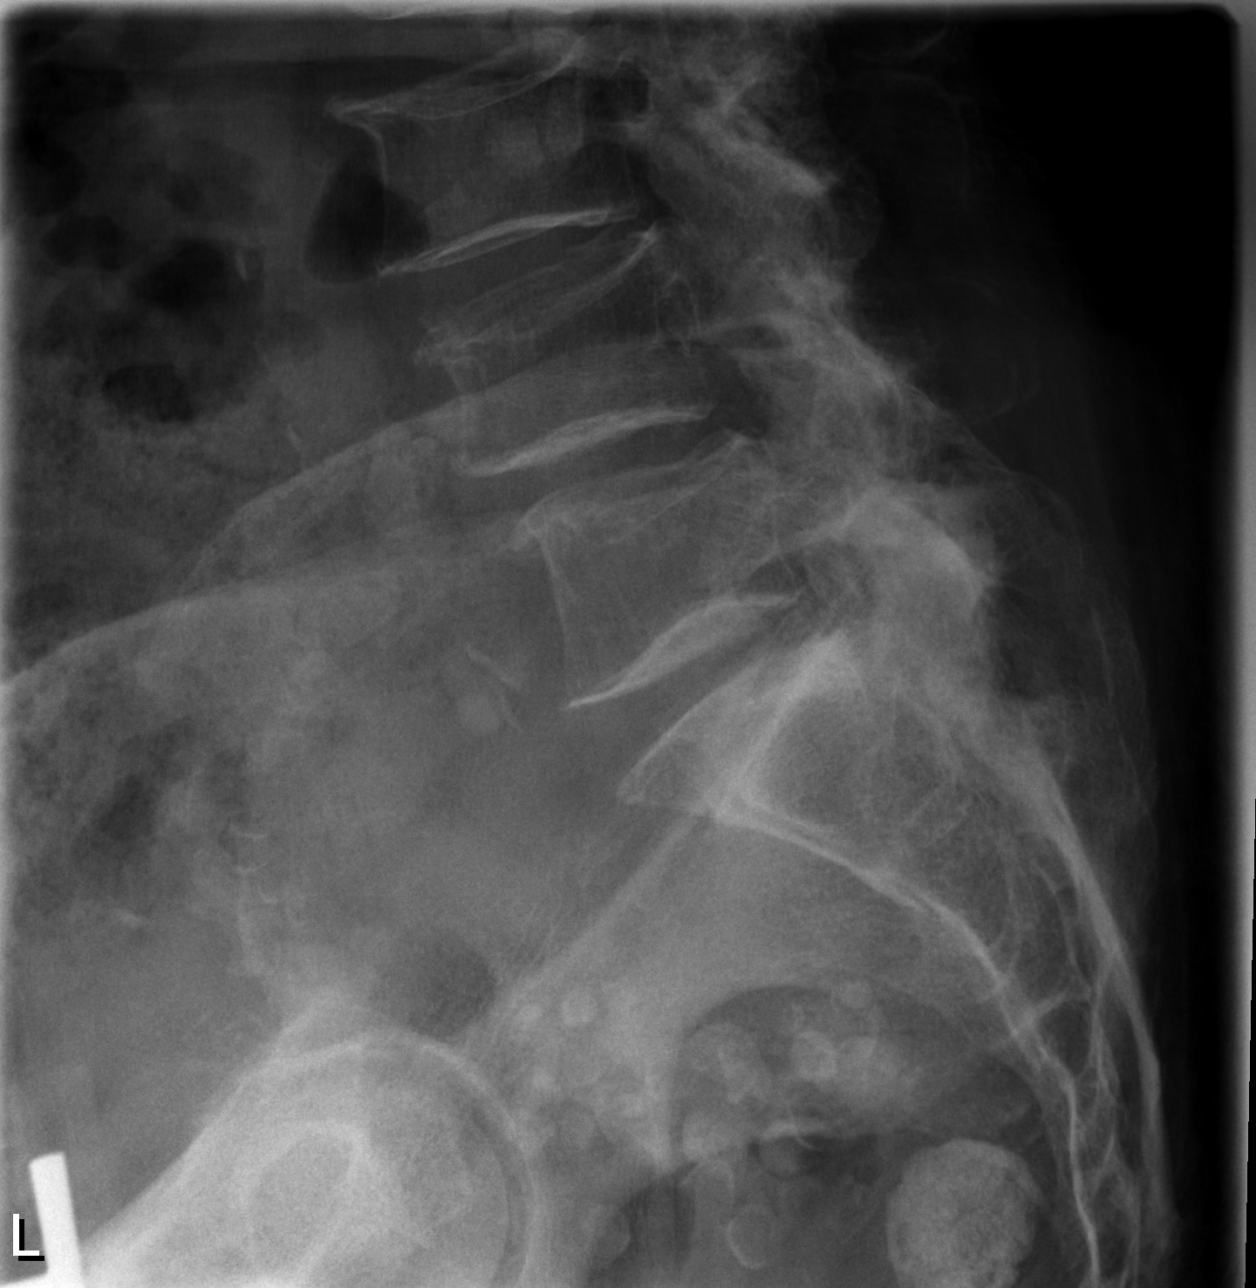

[5 of 5 positions shown; findings below may reference images not displayed]

FINDINGS: Multilevel degenerative disease is present with multiple
lumbar spine compression fractures.  When comparing these fractures
to the prior CT, of the L4 and L3 compression fractures appear
unchanged.  L2 maintains normal vertebral body height.  L1
compression fracture is unchanged.  L5 superior endplate
compression fracture also appears unchanged.  There is no change in
alignment.  Lower lumbar facet arthrosis is present with
anterolisthesis of L4 on L5.
IMPRESSION: No new acute osseous abnormality.  No new compression fracture is
identified.  If there is high clinical suspicion, consider MRI
lumbar spine to assess for marrow edema.

## 2010-10-12 NOTE — Procedures (Signed)
NAME:  Audrey Spencer, Audrey Spencer NO.:  [id number]   MEDICAL RECORD NO.:  [id number]          PATIENT TYPE:  OUT   LOCATION:  SLEEP CENTER                 FACILITY:  [facility]   PHYSICIAN:  Coralyn Helling, MD        DATE OF BIRTH:  1922/08/14   DATE OF STUDY:  03/26/2008                            NOCTURNAL POLYSOMNOGRAM   REFERRING PHYSICIAN:  Coralyn Helling, MD   FACILITY:  [facility].   REFERRING PHYSICIAN:  Coralyn Helling, MD   INDICATION:  Ms. [name] is an 77 year old female who has a history of  depression as well as sleep disruption and excessive daytime sleepiness.  She is referred to the Sleep Lab for evaluation of sleep-disordered  breathing.   Height is 5 feet 3 inches, weight is 145 pounds, BMI is 26, neck size is  16 inches.   MEDICATIONS:  Citalopram, Pepcid, Xalatan, and melatonin.  The patient  took a melatonin on the night of study.   Epworth score is 15.   SLEEP ARCHITECTURE:  Total recording time was 369 minutes.  Total sleep  time was 321 minutes.  Sleep efficiency was 87%.  Sleep latency was 0  minute.  REM latency was 80 minutes.  The study was notable for lack of  slow-wave sleep.  The patient slept in both supine and non-supine  positions.   RESPIRATORY DATA:  The average respiratory rate was 16.  As noticed by  the technician, the overall apnea-hypopnea index was 10.  The events  were exclusively obstructive in nature.  The REM apnea-hypopnea index  was 11, the non-REM apnea-hypopnea index was 10, the supine apnea-  hypopnea index was 10, and the non-supine apnea-hypopnea was 9.   OXYGEN DATA:  The baseline oxygenation was 96%.  The oxygen saturation  nadir was 81%.  The patient spent a total of 4 minutes with an oxygen  saturation less than 90% and 0.5 minutes with an oxygen saturation less  than 88%.   CARDIAC DATA:  The average heart rate was 66 and the rhythm strip showed  normal sinus rhythm.   MOVEMENT PARASOMNIA:  The periodic  limb movement index was 11, and the  patient had 1 rest room trip.   IMPRESSION:  Sleep study shows evidence for mild obstructive sleep apnea  with an apnea-hypopnea index of 10 and oxygen saturation 81%.  In  addition to diet, exercise, and weight reduction, further therapeutic  interventions could include CPAP therapy, oral appliance, or surgical  intervention.      Coralyn Helling, MD  Diplomat, American Board of Sleep Medicine  Electronically Signed     VS/MEDQ  D:  04/03/2008 07:52:49  T:  04/03/2008 21:20:27  Job:  [id number]

## 2010-10-12 NOTE — Procedures (Signed)
NAME:  KITARA, HEBB NO.:  [id number]   MEDICAL RECORD NO.:  [id number]          PATIENT TYPE:  OUT   LOCATION:  SLEEP CENTER                 FACILITY:  [facility]   PHYSICIAN:  Coralyn Helling, MD        DATE OF BIRTH:  Mar 18, 1923   DATE OF STUDY:  08/03/2008                            NOCTURNAL POLYSOMNOGRAM   REFERRING PHYSICIAN:  Coralyn Helling, MD   INDICATIONS:  Ms. Pearcy is an 77 year old female who has a history of  obstructive sleep apnea.  She had a overnight polysomnogram done on  March 26, 2008, and was found to have an apnea-hypopnea index of 10.  She returns to the sleep lab for a CPAP titration study.   Height is 5 feet 3 inches, weight is 150 pounds.  BMI is 27.  Neck size  is 16 inches.  Epworth score is 14.   MEDICATIONS:  Famotidine.   SLEEP ARCHITECTURE:  Total recording time was 444 minutes.  Total sleep  time was 333 minutes.  Sleep efficiency was 75%.  Sleep latency 19  minutes.  REM latency is 207 minutes.  The patient was observed in all  stages of sleep.  The patient slept exclusively in the supine position.   RESPIRATORY DATA:  The average respiratory rate was 18.  The patient was  titrated from a CPAP pressure setting of 5 to 14 cm of water at a CPAP  pressure setting of 10 cm of water.  The apnea-hypopnea index was  reduced to 1.6.  At this pressure setting, the patient was observed in  REM sleep, supine sleep and snoring was eliminated.   OXYGEN DATA:  The baseline oxygenation was 97%.  The oxygen saturation  nadir was 85% and appeared that the patient had good control of her  oxygenation with CPAP at 10 cm of water.   CARDIAC DATA:  The average heart rate is 59 and the rhythm strip showed  normal sinus rhythm with occasional PVCs.   MOVEMENT/PARASOMNIA:  The patient had 2 restroom trips.  Her periodic  limb movement index was 35.   IMPRESSION:  The patient had a good control of respiratory events with a  continuous positive  airway pressure setting of 10 cm of water.  At this  pressure setting, she was observed in REM sleep and supine sleep.   She did have a significant increase in her periodic limb movement index.  Further clinical assessment would be needed to determine if she has  symptoms consistent with restless legs syndrome.  If not, I would  recommend a period of clinical observation while she gets used to the  use of continuous positive airway pressure therapy.      Coralyn Helling, MD  Diplomat, American Board of Sleep Medicine  Electronically Signed     VS/MEDQ  D:  08/05/2008 13:11:32  T:  08/06/2008 01:56:48  Job:  [id number]

## 2010-10-12 NOTE — H&P (Signed)
NAME:  Audrey Spencer, SOLOMON              ACCOUNT NO.:  [id number]   MEDICAL RECORD NO.:  [id number]          PATIENT TYPE:  INP   LOCATION:  1311                         FACILITY:  [facility]   PHYSICIAN:  Genene Churn. Granfortuna, M.D.DATE OF BIRTH:  24-Mar-1923   DATE OF ADMISSION:  11/19/2008  DATE OF DISCHARGE:                              HISTORY & PHYSICAL   CHIEF COMPLAINT:  Back pain.   HISTORY OF PRESENT ILLNESS:  Audrey Spencer is an 77 year old woman with  initial diagnosis low grade, B cell,  non-Hodgkin's lymphoma in July  1993 with initial treatment with single-agent Cladribine on a clinical  trial.  She remained disease free until recently when she converted from  a well differentiated lymphocytic lymphoma to a chronic lymphocytic  leukemia.  She was started on a combination of Bendamustine/Rituxan  August 18, 2008 and has now completed four cycles.  Cycle four was  initiated on November 10, 2008.  She received Neulasta support.  Restaging  CT evaluation on November 04, 2008 showed resolution of previously noted  adenopathy.   [name] also has a history of a second primary stage I breast  cancer diagnosed April 1998, ER/PR positive treated with lumpectomy,  radiation and tamoxifen.   Audrey Spencer was referred for plain x-rays of the thoracic and lumbar  spine on November 11, 2008 due to complaint at that time of a several day  history of back pain.  Thoracic x-ray showed multilevel degenerative  disease without acute osseous abnormality.  Lumbar x-ray also showed  multilevel degenerative disease with multiple lumbar spine compression  fractures.  The fractures appeared unchanged when compared with the CT  on November 04, 2008 but new compared with a 12/09 CT scan.   Due to persistent pain she was referred for an MRI of the lumbar spine  earlier today which showed an acute L1 compression fracture  indeterminate for pathologic versus an insufficiency/osteoporotic  related compression deformity.   whic, as noted sbove, was new when  compared with a CT done December 2009.  There was also a mild superior  plate depression at T11, also acute, the radiologist commented more  likely to be osteopenic.  There were scattered foci of abnormal  enhancement of the spinous processes at T10, T11, T12, L1, L3 and L5,  indeterminate for malignancy.   Ms. Six contacted the office today to report marked increase in  back pain over the past several days with a significant increase since  the MRI earlier this morning.  She was instructed to come to the office  with plans for hospitalization for pain control.  She denies any known  trauma or injury to her back.  The pain is worse when she moves from a  sitting to standing position.  She had been taking Darvocet for the pain  but ran out of the medication last night.  The pain does not radiate  into her legs.  She denies any leg weakness or numbness.  No bowel or  bladder dysfunction.   PAST MEDICAL HISTORY:  1. Conversion of low grade non-Hodgkin's lymphoma to CLL, treated as  outlined above.  2. Stage I ER positive cancer of the left breast diagnosed April 1998      treated with surgery, radiation and hormonal therapy.  3. Degenerative arthritis.  4. Sleep apnea on CPAP.  5. Diverticulosis.  6. Hyperlipidemia.  7. glaucoma   MEDICATIONS:  1. Xalatan eye drops 1 drop each eye at bedtime.  2. Calcium 1200 mg daily.  3. Multivitamin daily.  4. Celexa 10 mg daily.  5. Xanax 0.25 mg as needed.  6. Melatonin daily.  7. Combigan eye drops 1 drop both eyes twice a day.  8. Pepcid 20 mg twice daily.  9. Zofran 8 mg as needed.  10.Tussionex cough syrup 5 mL at bedtime.  11.Darvocet as needed.   ALLERGIES:  1. CODEINE  2. SULFA.   FAMILY HISTORY:  Noncontributory.   SOCIAL HISTORY:  Audrey Spencer lives in [place].  Widoed,she lives by  herself.  No alcohol or tobacco use.   REVIEW OF SYSTEMS:  In addition to the review of  systems in the HPI, she  denies any unusual headaches or vision change.  She has had no fever.  She denies any chest pain.  No shortness of breath.  She has had a cough  for the past 10 days, intermittently productive.  She notes some  constipation since beginning pain medication last week.  No nausea or  vomiting.  No hematuria or dysuria.  She periodically experiences of  urge incontinence.   PHYSICAL EXAM:  VITAL SIGNS:  Temperature 96.9, heart rate 98,  respirations 20, blood pressure 102/65.  GENERAL:  Pleasant elderly female in obvious discomfort.  HEENT:  Normocephalic, atraumatic.  Pupils equal, round and reactive to  light.  Extraocular movements intact.  Sclerae anicteric.  Oropharynx is  without thrush or ulceration.  CHEST:  Rales at the left lung base.  CARDIOVASCULAR:  Regular rate and rhythm.  ABDOMEN:  Soft and nontender.  No organomegaly.  EXTREMITIES:  No edema.  NEURO:  Alert and oriented.  Moves all extremities.  Motor strength is  5/5.  Right knee DTR is 1+, left knee 2+.   LABORATORY DATA:  Pending.   IMPRESSION:  This most likely represents a benign compression fracture  in an elderly woman who has had steroids as part of her chemotherapy  program.  However, per my discussion with the neuro-radiologist, changes  at multiple other levels of her spine are concerning for possible  malignant changes.  She is now 75 years out from diagnosis of an early  stage, ER positive breast cancer and is currently receiving chemotherapy  with an agent that would also have activity against breast cancer so it  is still unclear that we are dealing with benighn or malignant changes  in her bones.  PLAN:  1. Rule out benign versus pathologic L1 compression fracture.  2. Pain secondary to #1.  3. Chronic lymphocytic leukemia status post Bendamustine and Rituxan      cycle four beginning November 10, 2008 with Neulasta support.  4. Prior low grade Non Hodgkin's Lymphoma  5. History  of stage I breast cancer.  6. Cough/abnormal lung exam.   PLAN:  1. Admit for pain control.  2. Interventional Radiology consultation regarding CT biopsy L1 spine      with concomitant vertebroplasty.  3. Chest x-ray.   The patient interviewed and examined by Dr. Cyndie Chime; plan per Dr.  Cyndie Chime.      Lonna Cobb, N.P.      Genene Churn.  Cyndie Chime, M.D.  Electronically Signed    LT/MEDQ  D:  11/19/2008  T:  11/19/2008  Job:  [id number]

## 2010-10-12 NOTE — Discharge Summary (Signed)
NAME:  DUDLEY, COOLEY              ACCOUNT NO.:  [id number]   MEDICAL RECORD NO.:  [id number]          PATIENT TYPE:  INP   LOCATION:  1311                         FACILITY:  [facility]   PHYSICIAN:  Genene Churn. Granfortuna, M.D.DATE OF BIRTH:  10-19-1922   DATE OF ADMISSION:  11/19/2008  DATE OF DISCHARGE:  11/24/2008                               DISCHARGE SUMMARY   Mrs. Seybold is a pleasant 77 year old woman initially diagnosed with a  low grade B-cell non-Hodgkin's lymphoma in July,1993, treated with  single-agent cladribine  chemotherapy.  She achieved a prolonged and  durable remission.  She developed a second primary stage I, ER positive  cancer of the left breast status post lumpectomy, radiation and  tamoxifen hormonal therapy which was diagnosed in April, 1998.   She recently developed chronic lymphocytic leukemia which is likely a  conversion of her original low grade lymphoma.  This is characterized by  progressive leukocytosis with absolute lymphocytosis, low grade  lymphadenopathy, and progressive anemia and mild thrombocytopenia.  She  was started on a chemotherapy program with bendamustine plus Rituxan  anti B-cell antibody.  She has had four treatments to date, most recent  given on November 10, 2008.  She appears to be responding rapidly with  resolution of all palpable lymphadenopathy and reversion of her white  count differential to normal.   She presented at the time of the current admission with acute onset of  low back pain without any history of trauma occurring over about 1 week  prior to admission.  Regular x-rays done as an outpatient showed  degenerative changes and multiple compression fractures.  MRI scan done  on November 19, 2008, showed a compression fracture of L1, early endplate  compression fracture at T11, and in addition, abnormal enhancement at  other levels in her spine including T10, T12, L3 and L5.  The  radiologist could not determine whether these were  benign or malignant  changes.  A prior CT scan done back in December, 2009,did not show any  of the compression fractures seen on the current MRI.  In view of her  acute pain she was admitted to the hospital for further management.   INITIAL EXAM:  Showed a elderly woman who was in obvious pain.  Pulse 98 regular, blood pressure 102/65, respirations 20, temperature  96.9.  There were rales at the left lung base.  No lymphadenopathy.  No organomegaly.  NEUROLOGIC: Showed motor strength 5/5 all extremities.  Reflexes 2+ at  the left knee, 1+ at the right knee.   HOSPITAL COURSE:  Initial attention directed to the new pathology in the  spine.  She was started on a low-dose long-acting transdermal narcotic  (Duragesic 25 mcg) with additional p.r.n. oxycodone with acetaminophen.  A interventional radiology consultation was obtained.  She was felt to  be a good candidate for vertebroplasties at T11 and L1.  She underwent  uncomplicated vertebroplasties at these levels on the November 20, 2008.  A  needle biopsy of the L1 vertebrae was obtained.  Pathology showed  reactive changes in the bone.  Some associated bone marrow  taken with  the specimen did show some small lymphoid aggregates, consistent with  her underlying known CLL/lymphoma.  She had a very nice response to the  vertebroplasty with prompt reduction in her pain level.  She was able to  ambulate with assistance at time of discharge.   She has had a persistent cough and has had a number of courses of  outpatient oral antibiotics.  A chest radiograph done at time of  admission showed a small area of consolidation in the left lower lung.  She did not have a fever.  Her initial white blood count was elevated at  21,000 with 91% neutrophils, 3% lymphocytes.  Not clear whether this was  a rebound coming off of her recent chemotherapy treatment or represented  underlying infection.  She was given a 5-day course of parenteral  Rocephin.   Cough improved, but was still persistent at discharge and she  had persistent rales at the left base as well.  Further follow-up as an  outpatient.   There were no complications.   Additional data obtained during this admission included admission CBC  with hemoglobin 12, hematocrit 36.5, white count 21,000, 91%  neutrophils, 3% lymphocytes platelets 146,000.  Pro time baseline 12.7  on no anticoagulation.  Chemistry profile with BUN 6, creatinine 0.6,  serum alkaline phosphatase elevated at 206 (39-117).  Liver functions  normal.  CBC near time of discharge on 06/26, hemoglobin 11.9,  hematocrit 35, white count 5500 with 74 neutrophils, 14 lymphocytes, 8  monocytes, platelet count 120,000.   CONSULTATIONS:  Interventional radiology.   PROCEDURES:  1. T11 and L1 vertebroplasties.  2. Needle biopsy of L1 spine.   DISCHARGE DIAGNOSES:  1. Acute back pain due to the T11 and L1 compression fractures.  2. Left lower lobe pneumonia.  3. Chronic lymphocytic leukemia, currently under treatment.  4. Low grade non-Hodgkin's lymphoma in remission.  5. Stage I, estrogen-receptor positive cancer of the left breast in      remission.  6. Mild sleep apnea syndrome.  7. Glaucoma.  8. Gastroesophageal reflux disease.   DISPOSITION:  Condition stable at time of discharge.  She will resume  limited activity, home physical and occupational therapy will be  arranged.  She will resume her regular diet.  Follow-up in my office in  1 week.   MEDICATIONS:  To include:  1. Duragesic 25 mcg transdermal narcotic patch to change every 3 days.  2. Darvocet-N 100 one to two q.4h. p.r.n. breakthrough pain.  3. Xalatan 0.005% ophthalmic drops both eyes daily.  4. Combigan 0.25% both eyes twice daily.  5. Zantac 150 mg b.i.d.  6. Tussionex 5 mL at bedtime or q.12h. p.r.n.  7. Celexa 10 mg daily.  8. Xanax 0.25 mg q.6h. p.r.n.  9. Melatonin 3 mg at bedtime p.r.n. sleep.  10.Calcium gluconate 1200 mg  daily.  11.Multivitamin one daily.      Genene Churn. Cyndie Chime, M.D.  Electronically Signed     JMG/MEDQ  D:  11/24/2008  T:  11/24/2008  Job:  [id number]   cc:   Valetta Mole. Swords, MD  [address] [place]  [place] [address]   S. Kyra Manges, M.D.  Fax: [phone]

## 2010-10-15 NOTE — H&P (Signed)
NAME:  Audrey Spencer, Audrey Spencer              ACCOUNT NO.:  [id number]   MEDICAL RECORD NO.:  [id number]          PATIENT TYPE:   LOCATION:                                 FACILITY:   PHYSICIAN:  Audrey Rexene Edison. Spencer, M.D. [facility]   DATE OF BIRTH:   DATE OF ADMISSION:  03/09/2005  DATE OF DISCHARGE:                                HISTORY & PHYSICAL   CHIEF COMPLAINT:  Weak and fatigue.   HISTORY OF PRESENT ILLNESS:  Audrey Spencer is a 77 year old female generally  healthy who went to her gynecologist today complaining of black stools.  Hemoglobin there revealed a hemoglobin of 7.9.  She complains of a one-week  history of profound fatigue and weakness associated with shortness of  breath.  She denies any chest pain or PND.  She has noted black stools for  at least the past week.  She does take Naproxen nightly and has taken maybe  four to five ibuprofen over the past one to two weeks for shoulder and knee  pain.   PAST MEDICAL HISTORY:  1.  Breast cancer.  2.  Hysterectomy.  3.  Lymphoma.   MEDICATIONS:  1.  Calcium.  2.  Multivitamin.  3.  Pepcid 20 mg p.o. daily.  4.  Elavil 25 mg p.o. q.h.s.  5.  Paxil 20 mg p.o. daily.  6.  Naproxen nightly.   SOCIAL HISTORY:  She lives alone.  She is a nonsmoker.  Does not drink  alcohol.   REVIEW OF SYSTEMS:  As above.  She denies any other complaints in review of  systems.   PHYSICAL EXAMINATION:  VITAL SIGNS:  Temperature 98, pulse 88, respirations  17, blood pressure 110/60.  GENERAL:  She appears as a well-developed, well-nourished female in no acute  distress.  She is markedly pale.  HEENT:  Atraumatic, normocephalic.  Extraocular muscles are intact.  Conjunctivae are pale.  Sclerae white.  NECK:  Supple without lymphadenopathy, thyromegaly, jugular venous  distention, or carotid bruits.  CHEST:  Clear to auscultation without any increased work of breathing.  CARDIAC:  S1 and S2 are normal without murmurs or gallops.  ABDOMEN:  Active bowel  sounds, soft, nontender.  There is no  hepatosplenomegaly.  No masses are palpated.  EXTREMITIES:  There is no clubbing, cyanosis, edema.  NEUROLOGIC:  She is alert and oriented without any motor or sensory  deficits.  RECTAL:  Not performed.  Audrey Spencer reports grossly heme-positive stools.   Hemoglobin in our office 7, hemoglobin in Audrey Spencer office 7.9.   ASSESSMENT/PLAN:  Gastrointestinal bleed with marked anemia.  Suspect she  has an upper gastrointestinal bleed.  I suspect she has a nonsteroidal-  induced gastric ulcer.  She needs admission.  Will monitor on telemetry.  Transfuse 2 units packed red blood cells.  She needs a gastrointestinal  consult for endoscopy.  Apparently, she had a colonoscopy in 2003.  I have  called for that report.  Will discontinue nonsteroidal agents at this time.      Audrey Spencer, M.D. [facility]  Electronically Signed     BHS/MEDQ  D:  03/09/2005  T:  03/09/2005  Job:  [id number]   cc:   Audrey Spencer, M.D.  Fax: [phone]   Audrey Spencer, M.D.  Fax: [phone]

## 2010-10-15 NOTE — Op Note (Signed)
NAME:  Audrey Spencer, Audrey Spencer              ACCOUNT NO.:  [id number]   MEDICAL RECORD NO.:  [id number]          PATIENT TYPE:  AMB   LOCATION:  DSC                          FACILITY:  [facility]   PHYSICIAN:  Lubertha Basque. Dalldorf, M.D.DATE OF BIRTH:  [birth year]   DATE OF PROCEDURE:  04/26/2005  DATE OF DISCHARGE:                                 OPERATIVE REPORT   PREOPERATIVE DIAGNOSES:  1.  Right shoulder rotator cuff tear.  2.  Right shoulder impingement.  3.  Right shoulder AC degeneration.   POSTOPERATIVE DIAGNOSES:  1.  Right shoulder rotator cuff tear.  2.  Right shoulder impingement.  3.  Right shoulder AC degeneration.   PROCEDURES:  1.  Right shoulder arthroscopic debridement.  2.  Right shoulder arthroscopic acromioplasty.  3.  Right shoulder arthroscopic AC resection.   ANESTHESIA:  General and block.   ATTENDING SURGEON:  Lubertha Basque. Jerl Santos, M.D.   ASSISTANT:  Lindwood Qua, P.A.   INDICATIONS FOR PROCEDURE:  The patient is a 77 year old woman with a very  long history of right shoulder pain. This has persisted despite oral anti-  inflammatories, exercise program, and several injections which afforded her  history just transient relief. She has undergone an MRI scan which shows a  massive rotator cuff tear with atrophy of the cuff musculature. She has pain  which limits her ability to rest and to use her arm and at this point she is  offered an arthroscopy. Informed operative consent was obtained after  discussion of possible complications of reaction to anesthesia and  infection. The patient understood that our intention was not to repair this  massive atrophied rotator cuff but to perform a debridement and resection of  some spurs conservatively.   DESCRIPTION OF PROCEDURE:  The patient was taken to the operating suite  where general anesthetic was applied without difficulty.  She was also given  a block in the preanesthesia area. She was positioned in beach-chair  position and prepped and draped in normal sterile fashion. After  administration of preoperative IV Kefzol, an arthroscopy of the right  shoulder was performed through a total of three portals. Glenohumeral joint  showed no degenerative change. Labral structures appeared intact though she  was missing her entire biceps tendon. The rotator cuff was torn with some  small fragments impinging on the joint. This was a fairly massive tear  though she did have anterior and posterior sleeves of tissue which seemed to  stabilize the humeral head in the glenohumeral joint. A debridement of the  central portion back over the glenoid was performed. She had a prominent  subacromial morphology addressed with a conservative acromioplasty back to a  flat surface. This was done with the bur in the lateral position followed by  transfer of the bur to the posterior position. She also had a inferior spur  at the distal clavicle and bone-on-bone contact at the [facility] joint and a formal  AC decompression was done through an anterior portal. Shoulder was  thoroughly irrigated followed by placement of some Marcaine. Simple sutures  of nylon were used to loosely reapproximate  the portals followed by Adaptic  and a dry gauze dressing with tape. Estimated blood loss and intraoperative  fluids can be obtained from anesthesia records.   DISPOSITION:  The patient was extubated in the operating room and taken to  recovery room in stable addition. Plans were for her to go home the same-day  and follow up in the office in less than a week. We did use latex  precautions throughout this case. I will contact her by phone tonight.      Lubertha Basque Jerl Santos, M.D.  Electronically Signed     PGD/MEDQ  D:  04/26/2005  T:  04/26/2005  Job:  [id number]

## 2010-10-15 NOTE — Discharge Summary (Signed)
NAME:  Audrey Spencer, HORAN NO.:  [id number]   MEDICAL RECORD NO.:  [id number]          PATIENT TYPE:  INP   LOCATION:  2031                         FACILITY:  [facility]   PHYSICIAN:  [name] Plump, MD [facility]    DATE OF BIRTH:  15-Mar-1923   DATE OF ADMISSION:  03/09/2005  DATE OF DISCHARGE:  03/11/2005                                 DISCHARGE SUMMARY   DISCHARGE DIAGNOSES:  1.  Severe iron-deficiency anemia with heme-positive stools.  2.  Urinary tract infection.   HISTORY OF PRESENT ILLNESS:  The patient is a 77 year old female who is  generally healthy, who went to her gynecologist on the day of admission  complaining of black stools.  The patient was noted to have hemoglobin of  7.9 at her GYN's office.  The patient complains of a one-week history of  profound fatigue and weakness associated with shortness of breath.  The  patient was admitted for a possible transfusion and further workup.   PAST MEDICAL HISTORY:  1.  Breast cancer.  2.  Hysterectomy.  3.  History of lymphoma.   COURSE OF HOSPITALIZATION:  Problem 1.  SEVERE IRON-DEFICIENCY ANEMIA WITH HEME-POSITIVE STOOLS:  The  patient was admitted and a follow-up CBC was performed, which showed a  hemoglobin of 6.7.  The patient underwent two-unit transfusion of packed red  blood cells with good symptomatic relief.  In addition, the patient  underwent an EGD on March 09, 2005, which showed duodenitis without  hemorrhage, and the patient underwent a colonoscopy on March 11, 2005,  which revealed diverticulosis and internal hemorrhoids.  At this time the  patient's hemoglobin remained stable.  After two units of blood, the  patient's hemoglobin is 9.8.  Plan to discharge the patient to home and  continue proton pump inhibitor as well as an iron supplement.  The GI team  feels that it is okay from a GI standpoint to discharge the patient home on  iron with outpatient GI follow-up with Dr. [name] in approximately  three  weeks.  In addition, the patient has been scheduled for an outpatient  capsule endoscopy.   Problem 2.  URINARY TRACT INFECTION:  The patient was noted to have a urine  culture which grew 100,000 colonies of E. coli.  As a result, the patient  will be placed on a three-day dose of Cipro.   LABORATORY DATA AT DISCHARGE:  Hemoglobin 9.8, hematocrit 28.7.   MEDICATIONS AT DISCHARGE:  1.  Cipro 250 mg p.o. b.i.d. for three days.  2.  The patient is to discontinue Pepcid, begin Protonix 40 mg p.o. daily.  3.  Xalatan eye drops at night as before hospitalization.  4.  Percocet 5/325 mg one tablet p.o. q.4h. as needed.  5.  Alprazolam 0.25 mg p.o. daily.  6.  Amitriptyline 10 mg p.o. daily as needed.  7.  Oxytrol 3.9 mg patch as before.  8.  Paxil 10 mg p.o. daily.  9.  Iron tablets 325 mg twice daily.   FOLLOW-UP:  The patient is to follow up with Dr. Lina Sar on October  30  at 3 p.m.  In addition, the patient is to undergo capsule endoscopy on  October 16 at 7:30 a.m.  A follow-up appointment has been scheduled with Dr.  Birdie Sons on October 19 at 9 a.m.  The patient is to stay off of NSAIDs.  At her follow-up appointment with Dr. Cato Mulligan she will need a repeat CBC.      Melissa S. Peggyann Juba, NP      [name] Plump, MD [facility]  Electronically Signed    MSO/MEDQ  D:  03/11/2005  T:  03/12/2005  Job:  [id number]

## 2010-10-15 NOTE — Assessment & Plan Note (Signed)
[facility]                           PRIMARY CARE OFFICE NOTE   Audrey Spencer, Audrey Spencer                     MRN:          [id number]  DATE:06/26/2006                            DOB:          1922/06/19    CHIEF COMPLAINT:  New patient to practice, transferring from Dr. Cato Mulligan.   HISTORY OF PRESENT ILLNESS:  The patient is an 77 year old white female  here to establish primary care.  She was formerly a patient of Dr. Birdie Sons and, due mainly to location reasons, she is transferring to the  [facility] office.   PAST MEDICAL HISTORY:  Significant for breast cancer and also non-  Hodgkin's lymphoma.  She has been followed by Dr. Cyndie Chime.  She  states she had a q.6 month checkup and he noted new abnormal lymph node  in the right side of her chest.  She is getting q.6 month CAT scans and  has a followup within the next 1 month time.  Review of E-chart notes  that her last CAT scan of her chest was in November of 2007, which noted  persistent enlarged bilateral axillary lymph nodes.  When compared to  prior exam, this was basically unchanged.  Stable CT of the abdomen.  The patient was noted to have small retroperitoneal lymph nodes and also  left renal and hepatic cysts.   She had an episode of severe anemia back in October of 2006.  She had  melena.  This was presumed secondary to chronic NSAID use.  She had an  endoscopy and colonoscopy with Dr. [name] of [facility].  Review of  chart notes that she had duodenitis on EGD without active hemorrhage.   She complains of some fatigue today, which has been ongoing for several  months.  She was prescribed Celexa in the past by Dr. Cyndie Chime.  She  has been also on Paxil in the past.  She states that she suffered the  death of her daughter approximately 6 months ago.  She struggled with  feelings of sadness.  Does not feel Celexa helped significantly.  However, her symptoms have improved and she is  currently struggling with  a decision whether to continue with Celexa or discontinue.   It has been at least 6 months since her last blood work.  Her last CBC  in 2007 showed an H&H that was normal at 12.8 and 38.2.   PAST MEDICAL HISTORY:   SUMMARY:  1. History of breast cancer, non-Hodgkin's lymphoma initially      diagnosed in 1993.  2. Status post anemia, presumed secondary to duodenitis from chronic      NSAID use.  3. History of glaucoma.  4. Diverticulosis.  5. Question depression.  6. Status post hysterectomy.  7. Status post right shoulder rotator cuff tear with arthroscopic      debridement in November 2006.   CURRENT MEDICATIONS:  1. Pepcid AC once a day.  2. Celexa 10 mg once a day.  3. Multivitamin once a day.  4. Oxytrol patch 2 times a week.  5. Dilantin eye drops 0.005% both eyes  once a day.  6. Ambien 5 mg 1/2 nightly p.r.n.   ALLERGIES TO MEDICATIONS:  Includes CODEINE, which causes slight nausea  and NSAIDS, which cause GI bleed.   SOCIAL HISTORY:  The patient is widowed.  She lives alone.  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  She has a son who lives in [place], but a supportive  niece who lives nearby.   HABITS:  No report of any alcohol or tobacco.   FAMILY HISTORY:  Mother deceased at age 48, presumed of old age,  congestive heart failure.  Father died at age 60, noted to have a  stroke.  She has a sister also with breast cancer and a brother with  esophageal cancer.   REVIEW OF SYSTEMS:  As noted above.  No ENT symptoms.  The patient  denies any chest pain or shortness of breath.  No heartburn symptoms.  No dysuria, frequency, or urgency.  All other systems negative.  Some  recent issues with memory and poor concentration.   PHYSICAL EXAM:  VITAL SIGNS:  Height is 5 feet 3 inches.  Weight is 150  pounds.  Temperature is 97.3.  Pulse is 82.  BP is 127/68 in the left  arm in a seated  position.  GENERAL:  The patient is a very pleasant, well-developed, well-nourished  77 year old white female in no apparent distress.  HEENT:  Normocephalic, atraumatic.  Pupils are equal, round, and  reactive to light bilaterally.  Extraocular motility was intact.  The  patient was anicteric.  Conjunctivae within normal limits.  External  auditory canals and tympanic membranes were clear bilaterally.  Hearing  was grossly normal.  Oropharynx was unremarkable.  NECK:  Supple.  No adenopathy, carotid bruit, or thyromegaly.  CHEST:  Normal respiratory effort.  Chest was clear to auscultation  bilaterally.  No rhonchi, rales, or wheezing.  CARDIOVASCULAR:  Regular rate and rhythm.  No significant murmurs, rubs,  or gallops appreciated.  ABDOMEN:  Soft.  Nontender.  Positive bowel sounds.  No organomegaly.  MUSCULOSKELETAL:  No cyanosis, clubbing, or edema.  SKIN:  Warm and dry.  NEUROLOGIC:  Cranial nerves 2-12 are intact.  She was nonfocal.   IMPRESSION/RECOMMENDATIONS:  1. Fatigue secondary to depression versus lymphoma with history of      anemia.  2. Non-Hodgkin's lymphoma followed by Dr. Cyndie Chime.  3. History of breast cancer.  4. History of anemia, presumed secondary to duodenitis or NSAID use.  5. Possible depression.  6. Chronic insomnia.  7. History of glaucoma.  8. Overactive bladder.  9. Health maintenance.   RECOMMENDATIONS:  The patient does not complain of headache, but does  have poor concentration and possible memory loss.  We discussed  repeating some of her blood work today, including B12, folate, thyroid  studies, and CBC.  Certainly, the patient is at higher risk for  depression and, at this point, she was advised to continue Celexa.  If  laboratory workup is normal, then we discussed her Celexa to 20 mg.   She is to followup with Dr. Cyndie Chime within the next 1 month time. She will likely need a repeat CT scan.  I doubt that the patient's  complaints of  poor concentration are related to non-Hodgkin's lymphoma  with mets to the brain.   We will try to obtain her previous records from Dr. Cato Mulligan and Dr.  Cyndie Chime.  Followup time is in approximately 4 weeks.  Barbette Hair. Artist Pais, DO  Electronically Signed    RDY/MedQ  DD: 06/26/2006  DT: 06/26/2006  Job #: [id number]

## 2010-11-15 ENCOUNTER — Other Ambulatory Visit: Payer: Self-pay | Admitting: Oncology

## 2010-11-15 ENCOUNTER — Encounter (HOSPITAL_BASED_OUTPATIENT_CLINIC_OR_DEPARTMENT_OTHER): Payer: Medicare Other | Admitting: Oncology

## 2010-11-15 DIAGNOSIS — C9110 Chronic lymphocytic leukemia of B-cell type not having achieved remission: Secondary | ICD-10-CM

## 2010-11-15 LAB — CBC WITH DIFFERENTIAL/PLATELET
Eosinophils Absolute: 0.2 10*3/uL (ref 0.0–0.5)
HCT: 34.5 % — ABNORMAL LOW (ref 34.8–46.6)
LYMPH%: 25.2 % (ref 14.0–49.7)
MONO#: 0.4 10*3/uL (ref 0.1–0.9)
NEUT#: 2 10*3/uL (ref 1.5–6.5)
NEUT%: 57.9 % (ref 38.4–76.8)
Platelets: 156 10*3/uL (ref 145–400)
WBC: 3.5 10*3/uL — ABNORMAL LOW (ref 3.9–10.3)
lymph#: 0.9 10*3/uL (ref 0.9–3.3)

## 2010-11-15 LAB — MORPHOLOGY
PLT EST: ADEQUATE
RBC Comments: NORMAL

## 2010-12-27 ENCOUNTER — Other Ambulatory Visit: Payer: Self-pay | Admitting: Internal Medicine

## 2011-01-17 ENCOUNTER — Encounter (HOSPITAL_BASED_OUTPATIENT_CLINIC_OR_DEPARTMENT_OTHER): Payer: Medicare Other | Admitting: Oncology

## 2011-01-17 ENCOUNTER — Other Ambulatory Visit: Payer: Self-pay | Admitting: Oncology

## 2011-01-17 DIAGNOSIS — C9110 Chronic lymphocytic leukemia of B-cell type not having achieved remission: Secondary | ICD-10-CM

## 2011-01-17 DIAGNOSIS — Z1231 Encounter for screening mammogram for malignant neoplasm of breast: Secondary | ICD-10-CM

## 2011-01-17 LAB — CBC WITH DIFFERENTIAL/PLATELET
BASO%: 0.5 % (ref 0.0–2.0)
Eosinophils Absolute: 0.5 10*3/uL (ref 0.0–0.5)
HCT: 32.9 % — ABNORMAL LOW (ref 34.8–46.6)
LYMPH%: 20 % (ref 14.0–49.7)
MCHC: 33.3 g/dL (ref 31.5–36.0)
MONO#: 0.6 10*3/uL (ref 0.1–0.9)
NEUT#: 2.9 10*3/uL (ref 1.5–6.5)
Platelets: 179 10*3/uL (ref 145–400)
RBC: 3.61 10*6/uL — ABNORMAL LOW (ref 3.70–5.45)
WBC: 5 10*3/uL (ref 3.9–10.3)
lymph#: 1 10*3/uL (ref 0.9–3.3)

## 2011-01-17 LAB — COMPREHENSIVE METABOLIC PANEL
ALT: 13 U/L (ref 0–35)
AST: 19 U/L (ref 0–37)
Alkaline Phosphatase: 72 U/L (ref 39–117)
CO2: 32 mEq/L (ref 19–32)
Creatinine, Ser: 0.66 mg/dL (ref 0.50–1.10)
Total Bilirubin: 0.4 mg/dL (ref 0.3–1.2)

## 2011-01-17 LAB — LACTATE DEHYDROGENASE: LDH: 198 U/L (ref 94–250)

## 2011-01-21 ENCOUNTER — Telehealth: Payer: Self-pay

## 2011-01-21 DIAGNOSIS — N390 Urinary tract infection, site not specified: Secondary | ICD-10-CM

## 2011-01-21 DIAGNOSIS — N360 Urethral fistula: Secondary | ICD-10-CM

## 2011-01-21 DIAGNOSIS — N361 Urethral diverticulum: Secondary | ICD-10-CM

## 2011-01-21 DIAGNOSIS — IMO0002 Reserved for concepts with insufficient information to code with codable children: Secondary | ICD-10-CM

## 2011-01-21 MED ORDER — NITROFURANTOIN MONOHYD MACRO 100 MG PO CAPS: 100.0000 mg | ORAL_CAPSULE | Freq: Two times a day (BID) | ORAL | Status: AC

## 2011-01-21 NOTE — Telephone Encounter (Signed)
Ok for urine studies as requested,  Code 599.1  Also for nitrofurantoin course as per emr

## 2011-01-21 NOTE — Telephone Encounter (Signed)
Pt's niece called stating pt is having dysuria, malodorous urine as well as frequency with decreased volume. Niece states she is unable to transport pt for OV but is requesting order for UA and culture, please advise.

## 2011-01-24 NOTE — Telephone Encounter (Signed)
Niece advised of Rx and UA order.

## 2011-01-25 ENCOUNTER — Other Ambulatory Visit (INDEPENDENT_AMBULATORY_CARE_PROVIDER_SITE_OTHER): Payer: Medicare Other

## 2011-01-25 ENCOUNTER — Encounter: Payer: Self-pay | Admitting: Internal Medicine

## 2011-01-25 ENCOUNTER — Telehealth: Payer: Self-pay

## 2011-01-25 DIAGNOSIS — F0390 Unspecified dementia without behavioral disturbance: Secondary | ICD-10-CM | POA: Insufficient documentation

## 2011-01-25 DIAGNOSIS — N390 Urinary tract infection, site not specified: Secondary | ICD-10-CM

## 2011-01-25 DIAGNOSIS — Z0000 Encounter for general adult medical examination without abnormal findings: Secondary | ICD-10-CM | POA: Insufficient documentation

## 2011-01-25 DIAGNOSIS — N360 Urethral fistula: Secondary | ICD-10-CM

## 2011-01-25 HISTORY — DX: Unspecified dementia, unspecified severity, without behavioral disturbance, psychotic disturbance, mood disturbance, and anxiety: F03.90

## 2011-01-25 HISTORY — DX: Urinary tract infection, site not specified: N39.0

## 2011-01-25 LAB — URINALYSIS, ROUTINE W REFLEX MICROSCOPIC
Bilirubin Urine: NEGATIVE
Nitrite: NEGATIVE
Urine Glucose: NEGATIVE
Urobilinogen, UA: 0.2 (ref 0.0–1.0)

## 2011-01-25 NOTE — Telephone Encounter (Signed)
Audrey Spencer requesting UA results to be called to her as is Medical power of attorney and on HIPAA form for the patient. Informed patient has short tem memory loss and may not remember if we call her with any instructions.

## 2011-04-04 ENCOUNTER — Other Ambulatory Visit: Payer: Self-pay | Admitting: Internal Medicine

## 2011-04-28 ENCOUNTER — Telehealth: Payer: Self-pay | Admitting: Oncology

## 2011-04-28 NOTE — Telephone Encounter (Signed)
Lvm advising appt 06/20/11 @ 1.45pm r/s'd from 12/17 appt cx'd due to Epic.

## 2011-06-13 ENCOUNTER — Ambulatory Visit: Payer: Medicare Other

## 2011-06-20 ENCOUNTER — Ambulatory Visit (HOSPITAL_BASED_OUTPATIENT_CLINIC_OR_DEPARTMENT_OTHER): Payer: Medicare Other | Admitting: Nurse Practitioner

## 2011-06-20 ENCOUNTER — Other Ambulatory Visit: Payer: Medicare Other | Admitting: Lab

## 2011-06-20 VITALS — BP 150/66 | HR 75 | Temp 96.6°F | Ht 63.0 in | Wt 162.3 lb

## 2011-06-20 DIAGNOSIS — C9110 Chronic lymphocytic leukemia of B-cell type not having achieved remission: Secondary | ICD-10-CM

## 2011-06-20 DIAGNOSIS — Z853 Personal history of malignant neoplasm of breast: Secondary | ICD-10-CM

## 2011-06-20 DIAGNOSIS — M810 Age-related osteoporosis without current pathological fracture: Secondary | ICD-10-CM

## 2011-06-20 LAB — MORPHOLOGY
PLT EST: ADEQUATE
RBC Comments: NORMAL

## 2011-06-20 LAB — CBC WITH DIFFERENTIAL/PLATELET
BASO%: 0.7 % (ref 0.0–2.0)
Eosinophils Absolute: 0.2 10*3/uL (ref 0.0–0.5)
LYMPH%: 26.9 % (ref 14.0–49.7)
MCHC: 34.4 g/dL (ref 31.5–36.0)
MONO#: 0.4 10*3/uL (ref 0.1–0.9)
NEUT#: 2.7 10*3/uL (ref 1.5–6.5)
RBC: 3.54 10*6/uL — ABNORMAL LOW (ref 3.70–5.45)
RDW: 15.8 % — ABNORMAL HIGH (ref 11.2–14.5)
WBC: 4.5 10*3/uL (ref 3.9–10.3)
lymph#: 1.2 10*3/uL (ref 0.9–3.3)

## 2011-06-20 LAB — COMPREHENSIVE METABOLIC PANEL
AST: 18 U/L (ref 0–37)
Albumin: 3.9 g/dL (ref 3.5–5.2)
BUN: 16 mg/dL (ref 6–23)
Calcium: 9 mg/dL (ref 8.4–10.5)
Chloride: 98 mEq/L (ref 96–112)
Creatinine, Ser: 0.63 mg/dL (ref 0.50–1.10)
Glucose, Bld: 91 mg/dL (ref 70–99)
Potassium: 4.2 mEq/L (ref 3.5–5.3)

## 2011-06-20 LAB — LACTATE DEHYDROGENASE: LDH: 151 U/L (ref 94–250)

## 2011-06-20 NOTE — Progress Notes (Signed)
OFFICE PROGRESS NOTE  Interval history:  Ms. Audrey Spencer returns as scheduled. She feels well. She has a good appetite and good energy level. No fevers or sweats. No shortness of breath or cough. She denies nausea/vomiting. Bowels moving regularly. She denies back pain.   Objective: Blood pressure 150/66, pulse 75, temperature 96.6 F (35.9 C), temperature source Oral, height 5\' 3"  (1.6 m), weight 162 lb 4.8 oz (73.619 kg).  Oropharynx is without thrush or ulceration. No palpable cervical, supraclavicular, axillary or inguinal lymph nodes. Lungs with faint inspiratory rales at the bases bilaterally. Regular cardiac rhythm. Abdomen soft and nontender. No organomegaly. Extremities are without edema.  Lab Results: Lab Results  Component Value Date   WBC 4.5 06/20/2011   HGB 11.8 06/20/2011   HCT 34.2* 06/20/2011   MCV 96.6 06/20/2011   PLT 166 06/20/2011    Chemistry:    Chemistry      Component Value Date/Time   NA 139 01/17/2011 1215   K 4.2 01/17/2011 1215   CL 101 01/17/2011 1215   CO2 32 01/17/2011 1215   BUN 14 01/17/2011 1215   CREATININE 0.66 01/17/2011 1215      Component Value Date/Time   CALCIUM 9.0 01/17/2011 1215   CALCIUM 8.9 03/19/2009 2037   ALKPHOS 72 01/17/2011 1215   AST 19 01/17/2011 1215   ALT 13 01/17/2011 1215   BILITOT 0.4 01/17/2011 1215       Studies/Results: No results found.  Medications: I have reviewed the patient's current medications.  Assessment/Plan: 1. Low grade B-cell non-Hodgkin's lymphoma diagnosed December 16, 1991.  She achieved a durable remission lasting for over 10 years with single-agent cladribine given on a clinical trial.  Transformation to CLL in 2010 with rising white count and lymphocyte percent.  She was treated with a combination of Treanda plus Rituxan for 5 cycles between 03/22 and 12/10/2008.  She achieved a complete hematologic and radiographic response.  Most recent followup CT scan done 07/28/2010 showed no adenopathy or splenomegaly.     2. Stage I ER positive cancer of the left breast diagnosed April 1998, treated with lumpectomy, radiation and tamoxifen hormonal therapy.   3. Osteoporosis with history of compression fractures of thoracic and lumbar spine requiring kyphoplasty.  4. History of upper GI bleed in the past secondary to nonsteroidal anti-inflammatory drugs. 5. History of microscopic hematuria.  Disposition-Ms. Audrey Spencer appears well. She will return for a followup visit and labs in 4 months. She will contact the office in the interim with any problems.  Lonna Cobb ANP/GNP-BC

## 2011-07-05 ENCOUNTER — Ambulatory Visit: Payer: Medicare Other

## 2011-07-19 ENCOUNTER — Ambulatory Visit: Payer: Medicare Other

## 2011-07-26 ENCOUNTER — Ambulatory Visit
Admission: RE | Admit: 2011-07-26 | Discharge: 2011-07-26 | Disposition: A | Discharge status: Home / Self Care | Payer: Medicare Other | Source: Ambulatory Visit | Attending: Oncology | Admitting: Oncology

## 2011-07-26 DIAGNOSIS — Z1231 Encounter for screening mammogram for malignant neoplasm of breast: Secondary | ICD-10-CM

## 2011-07-27 ENCOUNTER — Ambulatory Visit (INDEPENDENT_AMBULATORY_CARE_PROVIDER_SITE_OTHER): Payer: Medicare Other | Admitting: Internal Medicine

## 2011-07-27 ENCOUNTER — Other Ambulatory Visit (INDEPENDENT_AMBULATORY_CARE_PROVIDER_SITE_OTHER): Payer: Medicare Other

## 2011-07-27 VITALS — BP 142/70 | HR 87 | Temp 97.8°F | Ht 63.0 in | Wt 164.2 lb

## 2011-07-27 DIAGNOSIS — Z79899 Other long term (current) drug therapy: Secondary | ICD-10-CM

## 2011-07-27 DIAGNOSIS — Z0000 Encounter for general adult medical examination without abnormal findings: Secondary | ICD-10-CM

## 2011-07-27 LAB — LDL CHOLESTEROL, DIRECT: Direct LDL: 131.6 mg/dL

## 2011-07-27 LAB — TSH: TSH: 1.8 u[IU]/mL (ref 0.35–5.50)

## 2011-07-27 LAB — LIPID PANEL
Total CHOL/HDL Ratio: 3
Triglycerides: 127 mg/dL (ref 0.0–149.0)

## 2011-07-27 NOTE — Assessment & Plan Note (Signed)

## 2011-07-27 NOTE — Patient Instructions (Signed)
Please check with your insurance about whether the shingles shot is covered;  If so, make Nurse Appt to have the shot done here, or you could consider getting the shot with a prescription at a drug store. Continue all other medications as before Please have the pharmacy call if you need refills You are otherwise up to date with prevention today Please check your blood pressure on a regular basis;  Your goal is to be less than 140/90 Please go to LAB in the Basement for the blood and/or urine tests to be done today Please call the phone number [phone] (the PhoneTree System) for results of testing in 2-3 days;  When calling, simply dial the number, and when prompted enter the MRN number above (the Medical Record Number) and the # key, then the message should start. Please return in 1 year for your yearly visit, or sooner if needed, with Lab testing done 3-5 days before

## 2011-07-31 ENCOUNTER — Encounter: Payer: Self-pay | Admitting: Internal Medicine

## 2011-07-31 NOTE — Progress Notes (Signed)
Subjective:    Patient ID: Audrey Spencer, female    DOB: 06-12-1922, 78 y.o.   MRN: [id number]  HPI  Here for wellness and f/u;  Overall doing ok;  Pt denies CP, worsening SOB, DOE, wheezing, orthopnea, PND, worsening LE edema, palpitations, dizziness or syncope.  Pt denies neurological change such as new Headache, facial or extremity weakness.  Pt denies polydipsia, polyuria, or low sugar symptoms. Pt states overall good compliance with treatment and medications, good tolerability, and trying to follow lower cholesterol diet.  Pt denies worsening depressive symptoms, suicidal ideation or panic. No fever, wt loss, night sweats, loss of appetite, or other constitutional symptoms.  Pt states good ability with ADL's, low fall risk, home safety reviewed and adequate, no significant changes in hearing or vision, and occasionally active with exercise. BP at home usually < 140/90 Past Medical History  Diagnosis Date  . Dementia 01/25/2011  . CLL 09/22/2009  . Personal history of malignant neoplasm of breast 09/22/2009  . ANEMIA-IRON DEFICIENCY 04/09/2007  . Bronchiectasis without acute exacerbation 01/07/2009  . GERD 02/26/2008  . HYPERLIPIDEMIA 04/09/2007  . OBSTRUCTIVE SLEEP APNEA 04/22/2008  . Pancytopenia 03/19/2009  . Recurrent UTI 01/25/2011  . DIVERTICULOSIS, COLON 04/15/2010  . GLAUCOMA 04/09/2007  . 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 OSTEOPOROSIS 03/19/2009  . RECTOCELE WITHOUT MENTION OF UTERINE PROLAPSE 04/15/2010   Past Surgical History  Procedure Date  . Breast surgery     lumpectomy  . Abdominal hysterectomy   . Rotater cuff     reports that she has never smoked. She does not have any smokeless tobacco history on file. She reports that she does not drink alcohol or use illicit drugs. family history includes Cancer in her brother and sister and Hypertension in her father. Allergies  Allergen Reactions  . Pseudoephedrine Other (See Comments)    insomnia  . Codeine    REACTION: nausea (mild)  . Doxycycline     REACTION: Nausea  . Nsaids     REACTION: GI bleed/anemia  . Solifenacin Succinate     REACTION: blurred vision   Current Outpatient Prescriptions on File Prior to Visit  Medication Sig Dispense Refill  . aspirin 325 MG tablet Take 325 mg by mouth daily.      . calcium carbonate (OS-CAL) 600 MG TABS Take 600 mg by mouth daily.      . citalopram (CELEXA) 10 MG tablet TAKE ONE TABLET BY MOUTH DAILY  90 tablet  0  . ferrous sulfate 325 (65 FE) MG tablet Take 325 mg by mouth daily with breakfast.      . Multiple Vitamin (MULTIVITAMIN) tablet Take 1 tablet by mouth daily.      Marland Kitchen omeprazole (PRILOSEC) 20 MG capsule Take 20 mg by mouth daily.        Review of Systems Review of Systems  Constitutional: Negative for diaphoresis, activity change, appetite change and unexpected weight change.  HENT: Negative for hearing loss, ear pain, facial swelling, mouth sores and neck stiffness.   Eyes: Negative for pain, redness and visual disturbance.  Respiratory: Negative for shortness of breath and wheezing.   Cardiovascular: Negative for chest pain and palpitations.  Gastrointestinal: Negative for diarrhea, blood in stool, abdominal distention and rectal pain.  Genitourinary: Negative for hematuria, flank pain and decreased urine volume.  Musculoskeletal: Negative for myalgias and joint swelling.  Skin: Negative for color change and wound.  Neurological: Negative for syncope and numbness.  Hematological: Negative for adenopathy.  Psychiatric/Behavioral: Negative for  hallucinations, self-injury, decreased concentration and agitation.      Objective:   Physical Exam BP 142/70  Pulse 87  Temp(Src) 97.8 F (36.6 C) (Oral)  Ht 5\' 3"  (1.6 m)  Wt 164 lb 4 oz (74.503 kg)  BMI 29.10 kg/m2  SpO2 96% Physical Exam  VS noted Constitutional: Pt is oriented to person, place, and time. Appears well-developed and well-nourished.  HENT:  Head: Normocephalic and  atraumatic.  Right Ear: External ear normal.  Left Ear: External ear normal.  Nose: Nose normal.  Mouth/Throat: Oropharynx is clear and moist.  Eyes: Conjunctivae and EOM are normal. Pupils are equal, round, and reactive to light.  Neck: Normal range of motion. Neck supple. No JVD present. No tracheal deviation present.  Cardiovascular: Normal rate, regular rhythm, normal heart sounds and intact distal pulses.   Pulmonary/Chest: Effort normal and breath sounds normal.  Abdominal: Soft. Bowel sounds are normal. There is no tenderness.  Musculoskeletal: Normal range of motion. Exhibits no edema.  Lymphadenopathy:  Has no cervical adenopathy.  Neurological: Pt is alert and oriented to person, place, and time. Pt has normal reflexes. No cranial nerve deficit.  Skin: Skin is warm and dry. No rash noted.  Psychiatric:  Has  normal mood and affect. Behavior is normal.     Assessment & Plan:

## 2011-09-09 ENCOUNTER — Other Ambulatory Visit: Payer: Self-pay | Admitting: Internal Medicine

## 2011-09-09 ENCOUNTER — Other Ambulatory Visit: Payer: Self-pay | Admitting: Oncology

## 2011-09-09 DIAGNOSIS — C8589 Other specified types of non-Hodgkin lymphoma, extranodal and solid organ sites: Secondary | ICD-10-CM

## 2011-09-15 ENCOUNTER — Other Ambulatory Visit: Payer: Self-pay | Admitting: *Deleted

## 2011-10-18 ENCOUNTER — Telehealth: Payer: Self-pay | Admitting: Oncology

## 2011-10-18 ENCOUNTER — Other Ambulatory Visit (HOSPITAL_BASED_OUTPATIENT_CLINIC_OR_DEPARTMENT_OTHER): Payer: Medicare Other | Admitting: Lab

## 2011-10-18 ENCOUNTER — Other Ambulatory Visit: Payer: Self-pay | Admitting: *Deleted

## 2011-10-18 ENCOUNTER — Ambulatory Visit (HOSPITAL_BASED_OUTPATIENT_CLINIC_OR_DEPARTMENT_OTHER): Payer: Medicare Other | Admitting: Oncology

## 2011-10-18 VITALS — BP 177/87 | HR 98 | Temp 96.9°F | Ht 63.0 in | Wt 164.4 lb

## 2011-10-18 DIAGNOSIS — C9110 Chronic lymphocytic leukemia of B-cell type not having achieved remission: Secondary | ICD-10-CM

## 2011-10-18 DIAGNOSIS — B999 Unspecified infectious disease: Secondary | ICD-10-CM

## 2011-10-18 DIAGNOSIS — Z853 Personal history of malignant neoplasm of breast: Secondary | ICD-10-CM

## 2011-10-18 DIAGNOSIS — C8290 Follicular lymphoma, unspecified, unspecified site: Secondary | ICD-10-CM

## 2011-10-18 DIAGNOSIS — J209 Acute bronchitis, unspecified: Secondary | ICD-10-CM

## 2011-10-18 DIAGNOSIS — C8590 Non-Hodgkin lymphoma, unspecified, unspecified site: Secondary | ICD-10-CM

## 2011-10-18 DIAGNOSIS — C50919 Malignant neoplasm of unspecified site of unspecified female breast: Secondary | ICD-10-CM

## 2011-10-18 LAB — CBC WITH DIFFERENTIAL/PLATELET
BASO%: 0.6 % (ref 0.0–2.0)
HGB: 12.1 g/dL (ref 11.6–15.9)
LYMPH%: 29 % (ref 14.0–49.7)
MCH: 31.8 pg (ref 25.1–34.0)
MCV: 96.8 fL (ref 79.5–101.0)
MONO#: 0.5 10*3/uL (ref 0.1–0.9)
NEUT#: 2.2 10*3/uL (ref 1.5–6.5)
NEUT%: 53.7 % (ref 38.4–76.8)
RBC: 3.82 10*6/uL (ref 3.70–5.45)
RDW: 15.4 % — ABNORMAL HIGH (ref 11.2–14.5)
lymph#: 1.2 10*3/uL (ref 0.9–3.3)

## 2011-10-18 LAB — COMPREHENSIVE METABOLIC PANEL
AST: 19 U/L (ref 0–37)
BUN: 17 mg/dL (ref 6–23)
CO2: 31 mEq/L (ref 19–32)
Calcium: 8.7 mg/dL (ref 8.4–10.5)
Chloride: 103 mEq/L (ref 96–112)
Creatinine, Ser: 0.62 mg/dL (ref 0.50–1.10)
Glucose, Bld: 104 mg/dL — ABNORMAL HIGH (ref 70–99)

## 2011-10-18 LAB — LACTATE DEHYDROGENASE: LDH: 163 U/L (ref 94–250)

## 2011-10-18 MED ORDER — AZITHROMYCIN 250 MG PO TABS: ORAL_TABLET | ORAL | Status: AC

## 2011-10-18 NOTE — Telephone Encounter (Signed)
gv pt appt schedule for sept °

## 2011-10-18 NOTE — Progress Notes (Signed)
Hematology and Oncology Follow Up Visit  Audrey Spencer [id number] 02/12/1923 78 y.o. 10/18/2011 3:10 PM   Principle Diagnosis: Encounter Diagnoses  Name Primary?  . Lymphoma Yes  . Breast CA   . Bronchitis, acute   . Lymphoma malignant, nodular, lymphocytic   . Personal history of malignant neoplasm of breast      Interim History:   Followup visit for this now 78 year old woman initially diagnosed with low-grade B-cell non-Hodgkin's lymphoma in July 1993. She achieved a durable remission lasting over 10 years with single agent cladribine chemotherapy. Her disease transformed to chronic lymphocytic leukemia in 2010. She was treated with Treanda plus Rituxan for 5 cycles between March 22 and 12/10/2008 . She achieved a complete hematologic and radiographic response. She developed a second primary stage I ER positive cancer of the left breast in April 1998 treated with lumpectomy, radiation, and then tamoxifen hormonal therapy.  Overall she is doing well. She's had no interim medical problems. Over the last 72 hours she has developed a nonproductive cough without any fever. She reports no nausea, vomiting, or diarrhea.   Medications: reviewed  Allergies:  Allergies  Allergen Reactions  . Pseudoephedrine Other (See Comments)    insomnia  . Codeine     REACTION: nausea (mild)  . Doxycycline     REACTION: Nausea  . Nsaids     REACTION: GI bleed/anemia  . Solifenacin Succinate     REACTION: blurred vision    Review of Systems: Constitutional: No constitutional symptoms   Respiratory: See above Cardiovascular:  No chest pain or palpitations Gastrointestinal: See above Genito-Urinary: No urinary tract symptoms Musculoskeletal: No pain other than that related to arthritis Neurologic: No headache or change in vision. Progressive memory loss. Skin: No rash or ecchymosis. Remaining ROS negative.  Physical Exam: Blood pressure 177/87, pulse 98, temperature 96.9 F (36.1 C),  temperature source Oral, height 5\' 3"  (1.6 m), weight 164 lb 6.4 oz (74.571 kg). Wt Readings from Last 3 Encounters:  10/18/11 164 lb 6.4 oz (74.571 kg)  07/27/11 164 lb 4 oz (74.503 kg)  06/20/11 162 lb 4.8 oz (73.619 kg)     General appearance: Well-nourished Caucasian woman HENNT: Pharynx no erythema or exudate. Neck full range of motion. Lymph nodes: No cervical, supraclavicular, or axillary adenopathy Breasts: Surgical changes left breast. No dominant mass in either breast Lungs: Clear to auscultation resonant to percussion Heart: Regular rhythm no murmur Abdomen: Soft nontender no mass no organomegaly Extremities: No edema no calf tenderness Vascular: No cyanosis Neurologic: No focal deficit. She is alert and oriented and cooperative. Motor strength is 5 over 5. Reflexes 1+ symmetric. Skin: No rash or ecchymosis  Lab Results: Lab Results: White count differential with 54 neutrophils 29 lymphocytes 12 monocytes 5 eosinophils   Component Value Date   WBC 4.0 10/18/2011   HGB 12.1 10/18/2011   HCT 37.0 10/18/2011   MCV 96.8 10/18/2011   PLT 149 10/18/2011     Chemistry      Component Value Date/Time   NA 137 06/20/2011 1412   K 4.2 06/20/2011 1412   CL 98 06/20/2011 1412   CO2 32 06/20/2011 1412   BUN 16 06/20/2011 1412   CREATININE 0.63 06/20/2011 1412      Component Value Date/Time   CALCIUM 9.0 06/20/2011 1412   CALCIUM 8.9 03/19/2009 2037   ALKPHOS 62 06/20/2011 1412   AST 18 06/20/2011 1412   ALT 11 06/20/2011 1412   BILITOT 0.3 06/20/2011 1412  Radiological Studies: Most recent mammogram done February 2013 with no new disease   Impression and Plan: #1. Low-grade B-cell non-Hodgkin's lymphoma treated as outlined above. Transformation of her disease over time to chronic lymphocytic leukemia with second complete remission with chemotherapy plus Rituxan. Her CBC today is overall stable and there is no lymphadenopathy or organomegaly on exam. Plan: I will continue to  monitor her clinical and laboratory status every 4 months.  #2. Stage I ER positive cancer of the left breast treated as outlined above. Sure main street any new disease now out 15 years.  #3. History of upper GI bleed related to nonsteroidal anti-inflammatory drugs.  #4. Osteoporosis with history of compression fractures of the thoracic and lumbar spine requiring kyphoplasty procedures.  #5. Acute bronchitis. No signs of pneumonia by exam. I'm going to prescribe a Z-Pak use as directed.   CC:. Dr. Kirby Funk    Levert Feinstein, MD 5/21/20133:10 PM

## 2012-01-09 IMAGING — CR DG CHEST 2V
2 series · 2 of 2 positions shown · non-contrast
Comparison: 12/02/2009

CLINICAL DATA: Cough, bronchitis.

CHEST - 2 VIEW

[view not recorded (1 of 2)]
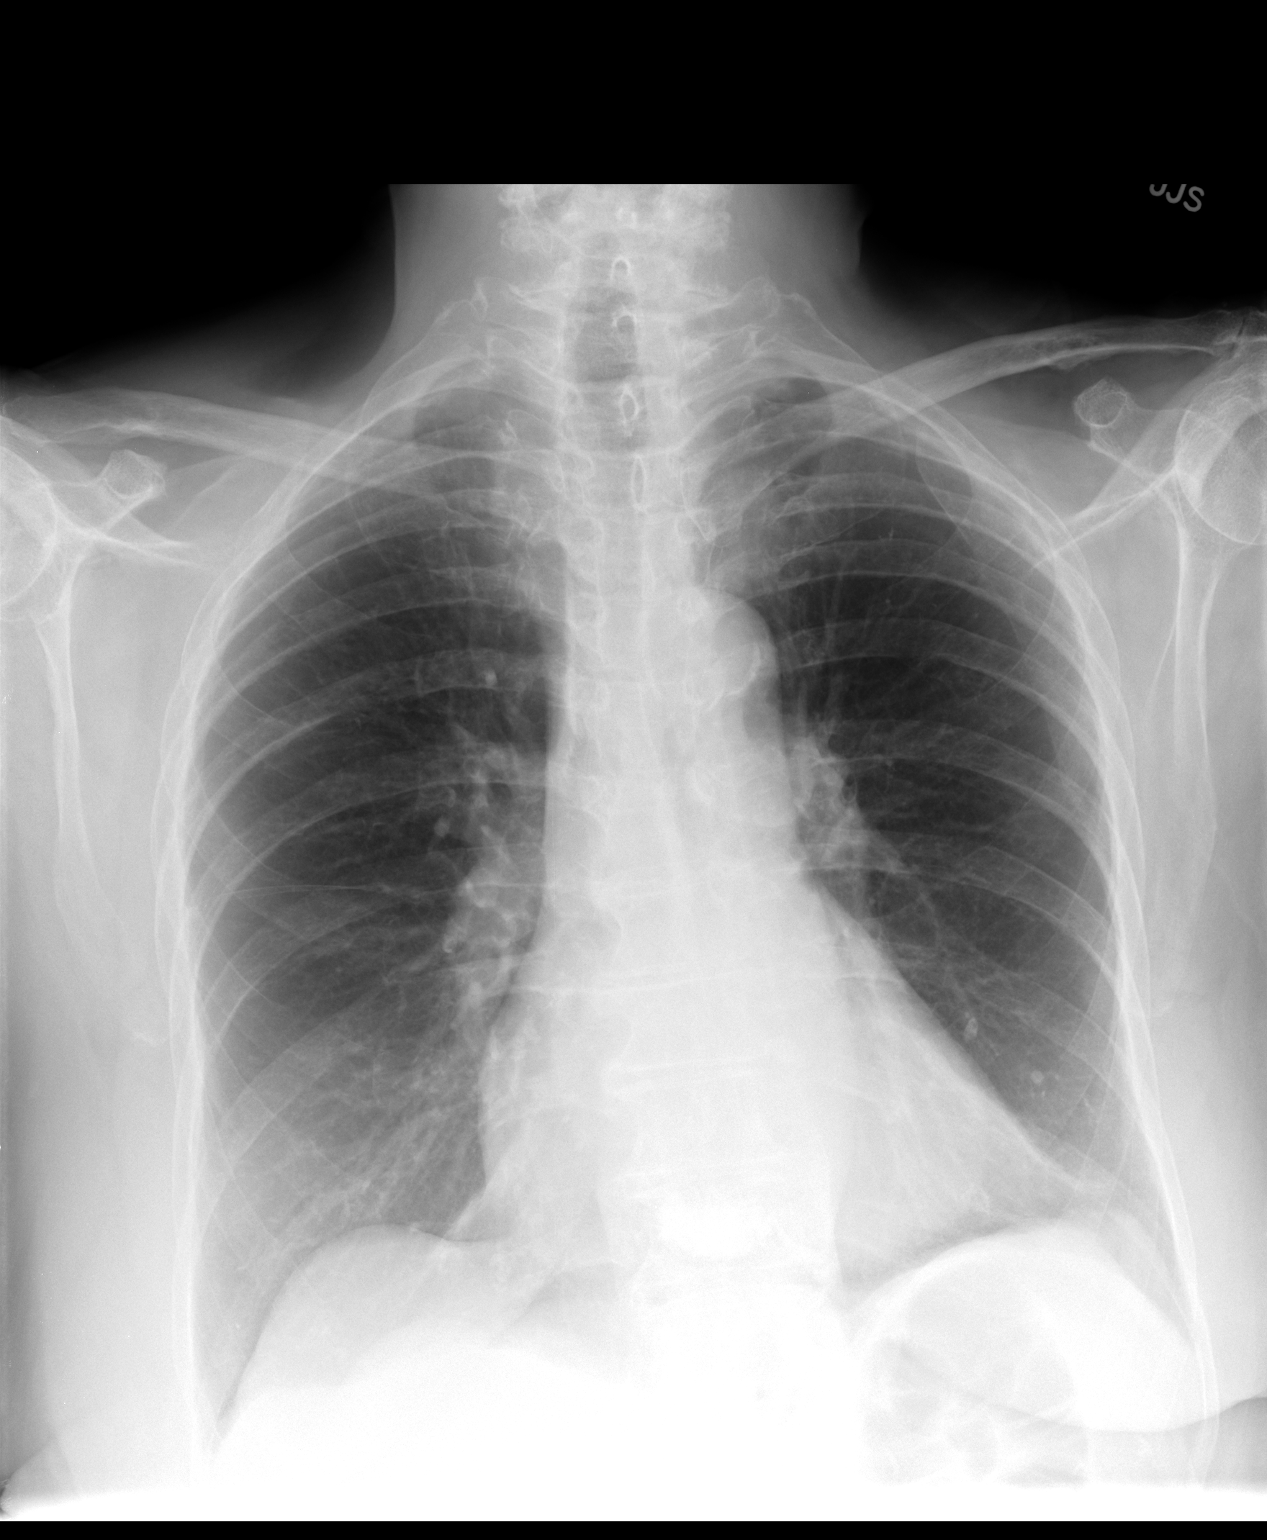

[view not recorded (2 of 2)]
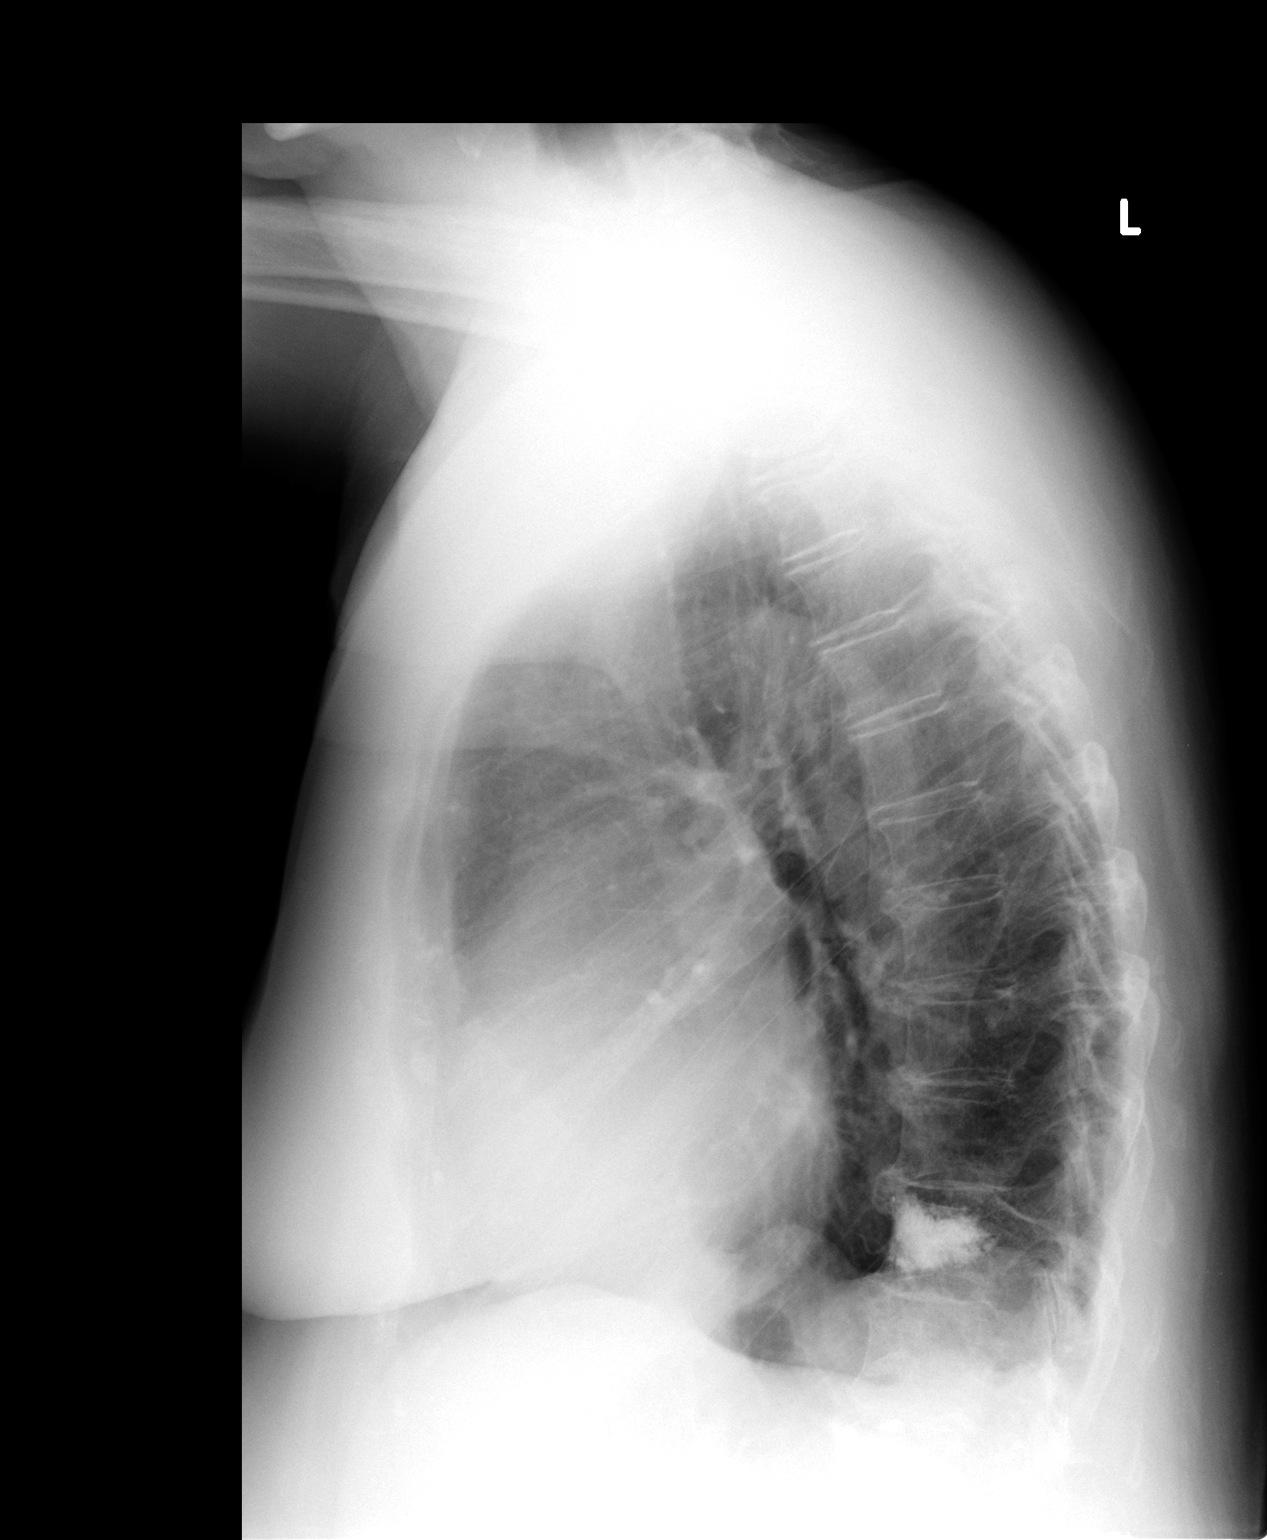

[2 of 2 positions shown; findings below may reference images not displayed]

FINDINGS: Trachea is midline.  Heart size stable.  Thoracic aorta
is calcified.  Mild biapical pleural parenchymal scarring.  There
may be scarring in the right middle lobe and lingula as well.  No
pleural fluid.  Lower thoracic vertebroplasty noted.
IMPRESSION: Scattered scarring.  No acute findings.

## 2012-02-01 ENCOUNTER — Encounter: Payer: Self-pay | Admitting: Internal Medicine

## 2012-02-21 ENCOUNTER — Telehealth: Payer: Self-pay | Admitting: Oncology

## 2012-02-21 ENCOUNTER — Other Ambulatory Visit (HOSPITAL_BASED_OUTPATIENT_CLINIC_OR_DEPARTMENT_OTHER): Payer: Medicare Other | Admitting: Lab

## 2012-02-21 ENCOUNTER — Ambulatory Visit (HOSPITAL_BASED_OUTPATIENT_CLINIC_OR_DEPARTMENT_OTHER): Payer: Medicare Other | Admitting: Lab

## 2012-02-21 ENCOUNTER — Ambulatory Visit (HOSPITAL_BASED_OUTPATIENT_CLINIC_OR_DEPARTMENT_OTHER): Payer: Medicare Other | Admitting: Nurse Practitioner

## 2012-02-21 ENCOUNTER — Other Ambulatory Visit: Payer: Self-pay | Admitting: Nurse Practitioner

## 2012-02-21 VITALS — BP 138/57 | HR 74 | Temp 97.9°F | Resp 18 | Ht 60.0 in | Wt 99.3 lb

## 2012-02-21 DIAGNOSIS — C8290 Follicular lymphoma, unspecified, unspecified site: Secondary | ICD-10-CM

## 2012-02-21 DIAGNOSIS — R300 Dysuria: Secondary | ICD-10-CM

## 2012-02-21 DIAGNOSIS — C8590 Non-Hodgkin lymphoma, unspecified, unspecified site: Secondary | ICD-10-CM

## 2012-02-21 DIAGNOSIS — C8589 Other specified types of non-Hodgkin lymphoma, extranodal and solid organ sites: Secondary | ICD-10-CM

## 2012-02-21 DIAGNOSIS — M810 Age-related osteoporosis without current pathological fracture: Secondary | ICD-10-CM

## 2012-02-21 DIAGNOSIS — Z23 Encounter for immunization: Secondary | ICD-10-CM

## 2012-02-21 DIAGNOSIS — Z853 Personal history of malignant neoplasm of breast: Secondary | ICD-10-CM

## 2012-02-21 LAB — CBC WITH DIFFERENTIAL/PLATELET
Basophils Absolute: 0.1 10*3/uL (ref 0.0–0.1)
Eosinophils Absolute: 0.3 10*3/uL (ref 0.0–0.5)
HCT: 37.2 % (ref 34.8–46.6)
LYMPH%: 29 % (ref 14.0–49.7)
MONO#: 0.4 10*3/uL (ref 0.1–0.9)
NEUT#: 3.1 10*3/uL (ref 1.5–6.5)
NEUT%: 56.9 % (ref 38.4–76.8)
Platelets: 156 10*3/uL (ref 145–400)
WBC: 5.5 10*3/uL (ref 3.9–10.3)

## 2012-02-21 LAB — COMPREHENSIVE METABOLIC PANEL (CC13)
Albumin: 3.7 g/dL (ref 3.5–5.0)
Alkaline Phosphatase: 73 U/L (ref 40–150)
CO2: 31 mEq/L — ABNORMAL HIGH (ref 22–29)
Glucose: 121 mg/dl — ABNORMAL HIGH (ref 70–99)
Potassium: 4.3 mEq/L (ref 3.5–5.1)
Sodium: 139 mEq/L (ref 136–145)
Total Protein: 6.4 g/dL (ref 6.4–8.3)

## 2012-02-21 LAB — URINALYSIS, MICROSCOPIC - CHCC
Nitrite: NEGATIVE
Protein: NEGATIVE mg/dL
pH: 6 (ref 4.6–8.0)

## 2012-02-21 MED ORDER — INFLUENZA VIRUS VACC SPLIT PF IM SUSP
0.5000 mL | Freq: Once | INTRAMUSCULAR | Status: AC
  Administered 2012-02-21: 0.5 mL via INTRAMUSCULAR
  Filled 2012-02-21: qty 0.5

## 2012-02-21 NOTE — Progress Notes (Signed)
OFFICE PROGRESS NOTE  Interval history:  Audrey Spencer is an 77 year old woman initially diagnosed with a low-grade B-cell non-Hodgkin's lymphoma in July 1993. She was initially treated with single agent cladribine achieving a durable remission lasting over 10 years. Disease transformed to chronic lymphocytic leukemia in 2010. She was treated with bendamustine/Rituxan for 5 cycles between 08/18/2008 and 12/10/2008 achieving a complete hematologic and radiographic response.  She was diagnosed with a second primary stage I ER positive cancer of the left breast in April 1998 treated with lumpectomy, radiation and tamoxifen hormonal therapy.  Audrey Spencer reports that she is doing well. No interim illnesses or infections. No fevers or sweats. No enlarged lymph nodes. She has a good appetite. She denies pain. No bowel or bladder problems. No nausea or vomiting. She has occasional mild dyspnea on exertion. No cough.   Objective: Blood pressure 138/57, pulse 74, temperature 97.9 F (36.6 C), temperature source Oral, resp. rate 18, height 5' (1.524 m), weight 99 lb 4.8 oz (45.042 kg).  Oropharynx is without thrush or ulceration. No palpable cervical, supraclavicular, axillary or inguinal lymph nodes. Status post left lumpectomy. No mass palpated in either breast. Lungs are clear. No wheezes or rales. Regular cardiac rhythm. Abdomen soft and nontender. No organomegaly. Trace lower leg edema bilaterally. Calves are soft and nontender.  Lab Results: Lab Results  Component Value Date   WBC 5.5 02/21/2012   HGB 12.4 02/21/2012   HCT 37.2 02/21/2012   MCV 97.6 02/21/2012   PLT 156 02/21/2012    Chemistry:    Chemistry      Component Value Date/Time   NA 140 10/18/2011 1354   K 4.2 10/18/2011 1354   CL 103 10/18/2011 1354   CO2 31 10/18/2011 1354   BUN 17 10/18/2011 1354   CREATININE 0.62 10/18/2011 1354      Component Value Date/Time   CALCIUM 8.7 10/18/2011 1354   CALCIUM 8.9 03/19/2009 2037   ALKPHOS  63 10/18/2011 1354   AST 19 10/18/2011 1354   ALT 12 10/18/2011 1354   BILITOT 0.3 10/18/2011 1354       Studies/Results: No results found.  Medications: I have reviewed the patient's current medications.  Assessment/Plan:  1. Low grade B-cell non-Hodgkin's lymphoma diagnosed December 16, 1991. She achieved a durable remission lasting for over 10 years with single-agent cladribine given on a clinical trial. Transformation to CLL in 2010 with rising white count and lymphocyte percent. She was treated with a combination of Treanda plus Rituxan for 5 cycles between 03/22 and 12/10/2008. She achieved a complete hematologic and radiographic response. CT scan done 07/28/2010 showed no adenopathy or splenomegaly.  2. Stage I ER positive cancer of the left breast diagnosed April 1998, treated with lumpectomy, radiation and tamoxifen hormonal therapy.  3. Osteoporosis with history of compression fractures of thoracic and lumbar spine requiring kyphoplasty.  4. History of upper GI bleed in the past secondary to nonsteroidal anti-inflammatory drugs.  Disposition-Audrey Spencer appears stable. She remains in clinical remission. She will return for a lab and office visit in 4 months. She will contact the office in the interim with any problems. Next mammogram is due in February of 2014.  Lonna Cobb ANP/GNP-BC   Addendum-as Audrey Spencer was leaving the office she reported experiencing mild pressure with urination over the past several days. We will obtain a urinalysis today.

## 2012-02-21 NOTE — Telephone Encounter (Signed)
pt had appts today and I made and gv pt schedule for JAN.....sed

## 2012-03-01 ENCOUNTER — Telehealth: Payer: Self-pay | Admitting: Nurse Practitioner

## 2012-03-01 ENCOUNTER — Ambulatory Visit (HOSPITAL_BASED_OUTPATIENT_CLINIC_OR_DEPARTMENT_OTHER): Payer: Medicare Other | Admitting: Lab

## 2012-03-01 ENCOUNTER — Other Ambulatory Visit: Payer: Self-pay | Admitting: Nurse Practitioner

## 2012-03-01 ENCOUNTER — Telehealth: Payer: Self-pay | Admitting: Oncology

## 2012-03-01 DIAGNOSIS — R3129 Other microscopic hematuria: Secondary | ICD-10-CM

## 2012-03-01 LAB — URINALYSIS, MICROSCOPIC - CHCC
Ketones: NEGATIVE mg/dL
Protein: NEGATIVE mg/dL
Specific Gravity, Urine: 1.01 (ref 1.003–1.035)
WBC, UA: NEGATIVE (ref 0–2)
pH: 6 (ref 4.6–8.0)

## 2012-03-01 NOTE — Telephone Encounter (Signed)
called pt and pt has to ck with niece for transport and will c/b

## 2012-03-01 NOTE — Telephone Encounter (Signed)
Notified Audrey Spencer of urinalysis and urine culture results from 02/21/2012. She is no longer having urinary symptoms. Due to the presence of blood in the urine specimen on 02/21/2012 we will have her submit another urine specimen to followup. Electronic POF submitted.

## 2012-03-02 ENCOUNTER — Telehealth: Payer: Self-pay | Admitting: Nurse Practitioner

## 2012-03-02 ENCOUNTER — Other Ambulatory Visit: Payer: Self-pay | Admitting: Nurse Practitioner

## 2012-03-02 DIAGNOSIS — R3129 Other microscopic hematuria: Secondary | ICD-10-CM

## 2012-03-02 DIAGNOSIS — R300 Dysuria: Secondary | ICD-10-CM

## 2012-03-02 MED ORDER — CIPROFLOXACIN HCL 500 MG PO TABS: 250.0000 mg | ORAL_TABLET | Freq: Two times a day (BID) | ORAL | Status: DC

## 2012-03-02 NOTE — Telephone Encounter (Signed)
I notified Audrey Spencer that the repeat urinalysis on 03/01/2012 was again positive for blood. Due to recent dysuria we will go ahead and treat with a 3 day course of ciprofloxacin. She will return for a repeat urinalysis in 2 weeks.

## 2012-03-03 ENCOUNTER — Other Ambulatory Visit: Payer: Self-pay | Admitting: Nurse Practitioner

## 2012-03-03 DIAGNOSIS — C8299 Follicular lymphoma, unspecified, extranodal and solid organ sites: Secondary | ICD-10-CM

## 2012-03-05 ENCOUNTER — Telehealth: Payer: Self-pay | Admitting: Oncology

## 2012-03-05 ENCOUNTER — Other Ambulatory Visit: Payer: Self-pay | Admitting: Nurse Practitioner

## 2012-03-05 DIAGNOSIS — R3129 Other microscopic hematuria: Secondary | ICD-10-CM

## 2012-03-05 NOTE — Telephone Encounter (Signed)
s.w. pt Oct 18th was not a good date bc she has a eye doctors appt she wanted Oct 17th....confirmed d.t with pt.

## 2012-03-15 ENCOUNTER — Other Ambulatory Visit: Payer: Medicare Other | Admitting: Lab

## 2012-03-16 ENCOUNTER — Other Ambulatory Visit: Payer: Self-pay | Admitting: Nurse Practitioner

## 2012-03-16 ENCOUNTER — Telehealth: Payer: Self-pay | Admitting: Nurse Practitioner

## 2012-03-16 ENCOUNTER — Telehealth: Payer: Self-pay | Admitting: Oncology

## 2012-03-16 ENCOUNTER — Other Ambulatory Visit (HOSPITAL_BASED_OUTPATIENT_CLINIC_OR_DEPARTMENT_OTHER): Payer: Medicare Other | Admitting: Lab

## 2012-03-16 DIAGNOSIS — R3129 Other microscopic hematuria: Secondary | ICD-10-CM

## 2012-03-16 LAB — URINALYSIS, MICROSCOPIC - CHCC
Ketones: NEGATIVE mg/dL
Protein: NEGATIVE mg/dL
Specific Gravity, Urine: 1.01 (ref 1.003–1.035)
pH: 6 (ref 4.6–8.0)

## 2012-03-16 NOTE — Telephone Encounter (Signed)
Talked to patient gave her appt with NP @ [facility] on 03/22/12 1:30pm , gave referral to HIM to fax notes

## 2012-03-16 NOTE — Telephone Encounter (Signed)
I notified Ms. Bilek that the repeat urinalysis done earlier today again showed blood and we are making a referral to urology. The order for the referral was entered into EPIC and an electronic POF was submitted.

## 2012-05-03 ENCOUNTER — Encounter: Payer: Self-pay | Admitting: Internal Medicine

## 2012-05-03 ENCOUNTER — Ambulatory Visit (INDEPENDENT_AMBULATORY_CARE_PROVIDER_SITE_OTHER): Payer: Medicare Other | Admitting: Internal Medicine

## 2012-05-03 VITALS — BP 140/80 | HR 86 | Temp 97.3°F | Wt 170.5 lb

## 2012-05-03 DIAGNOSIS — F3289 Other specified depressive episodes: Secondary | ICD-10-CM

## 2012-05-03 DIAGNOSIS — F32A Depression, unspecified: Secondary | ICD-10-CM

## 2012-05-03 DIAGNOSIS — C9110 Chronic lymphocytic leukemia of B-cell type not having achieved remission: Secondary | ICD-10-CM | POA: Insufficient documentation

## 2012-05-03 DIAGNOSIS — J479 Bronchiectasis, uncomplicated: Secondary | ICD-10-CM

## 2012-05-03 DIAGNOSIS — F0390 Unspecified dementia without behavioral disturbance: Secondary | ICD-10-CM

## 2012-05-03 DIAGNOSIS — F329 Major depressive disorder, single episode, unspecified: Secondary | ICD-10-CM

## 2012-05-03 HISTORY — DX: Depression, unspecified: F32.A

## 2012-05-03 MED ORDER — CITALOPRAM HYDROBROMIDE 10 MG PO TABS: 10.0000 mg | ORAL_TABLET | Freq: Every day | ORAL | Status: DC

## 2012-05-03 MED ORDER — DONEPEZIL HCL 5 MG PO TABS: 5.0000 mg | ORAL_TABLET | Freq: Every day | ORAL | Status: DC

## 2012-05-03 NOTE — Assessment & Plan Note (Signed)
Ok for citalopram 10 qd,  to f/u any worsening symptoms or concerns  

## 2012-05-03 NOTE — Assessment & Plan Note (Signed)
To continue f/u with oncology as planned

## 2012-05-03 NOTE — Progress Notes (Signed)
Subjective:    Patient ID: Audrey Spencer, female    DOB: 1923/05/06, 78 y.o.   MRN: [id number]  HPI  Here with granddaughter; overall doing ok,  Pt denies chest pain, increased sob or doe, wheezing, orthopnea, PND, increased LE swelling, palpitations, dizziness or syncope.  Pt denies new neurological symptoms such as new headache, or facial or extremity weakness or numbness   Pt denies polydipsia, polyuria, or low sugar symptoms such as weakness or confusion improved with po intake.  Pt states overall good compliance with meds, trying to follow lower cholesterol, diabetic diet, wt overall stable but has had increased overt ST memory loss in the past 6 mo;  Head CT 2010 neg for stroke, tumor, other.  Pt also with recurring sadness, tearfullness, low mood without psychosis.  Denies suicidal ideation, or panic Past Medical History  Diagnosis Date  . Dementia 01/25/2011  . CLL 09/22/2009  . Personal history of malignant neoplasm of breast 09/22/2009  . ANEMIA-IRON DEFICIENCY 04/09/2007  . Bronchiectasis without acute exacerbation 01/07/2009  . GERD 02/26/2008  . HYPERLIPIDEMIA 04/09/2007  . OBSTRUCTIVE SLEEP APNEA 04/22/2008  . Pancytopenia 03/19/2009  . Recurrent UTI 01/25/2011  . DIVERTICULOSIS, COLON 04/15/2010  . GLAUCOMA 04/09/2007  . 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 OSTEOPOROSIS 03/19/2009  . RECTOCELE WITHOUT MENTION OF UTERINE PROLAPSE 04/15/2010   Past Surgical History  Procedure Date  . Breast surgery     lumpectomy  . Abdominal hysterectomy   . Rotater cuff     reports that she has never smoked. She does not have any smokeless tobacco history on file. She reports that she does not drink alcohol or use illicit drugs. family history includes Cancer in her brother and sister and Hypertension in her father. Allergies  Allergen Reactions  . Pseudoephedrine Other (See Comments)    insomnia  . Codeine     REACTION: nausea (mild)  . Doxycycline     REACTION: Nausea  .  Nsaids     REACTION: GI bleed/anemia  . Solifenacin Succinate     REACTION: blurred vision   Current Outpatient Prescriptions on File Prior to Visit  Medication Sig Dispense Refill  . aspirin 325 MG tablet Take 325 mg by mouth daily.      . calcium carbonate (OS-CAL) 600 MG TABS Take 600 mg by mouth daily.      . ferrous sulfate 325 (65 FE) MG tablet Take 325 mg by mouth daily with breakfast.      . Melatonin 3 MG CAPS Take by mouth at bedtime.      . Multiple Vitamin (MULTIVITAMIN) tablet Take 1 tablet by mouth daily.      Marland Kitchen omeprazole (PRILOSEC) 20 MG capsule TAKE ONE CAPSULE BY MOUTH DAILY  60 capsule  3  . [DISCONTINUED] citalopram (CELEXA) 10 MG tablet TAKE ONE TABLET BY MOUTH DAILY  90 tablet  0  . donepezil (ARICEPT) 5 MG tablet Take 1 tablet (5 mg total) by mouth daily.  90 tablet  3   Review of Systems  Constitutional: Negative for diaphoresis and unexpected weight change.  HENT: Negative for tinnitus.   Eyes: Negative for photophobia and visual disturbance.  Respiratory: Negative for choking and stridor.   Gastrointestinal: Negative for vomiting and blood in stool.  Genitourinary: Negative for hematuria and decreased urine volume.  Musculoskeletal: Negative for gait problem.  Skin: Negative for color change and wound.  Neurological: Negative for tremors and numbness.  Psychiatric/Behavioral: Negative for decreased concentration. The patient is not  hyperactive.       Objective:   Physical Exam BP 140/80  Pulse 86  Temp 97.3 F (36.3 C) (Oral)  Wt 170 lb 8 oz (77.338 kg)  SpO2 93% Physical Exam  VS noted Constitutional: Pt appears well-developed and well-nourished.  HENT: Head: Normocephalic.  Right Ear: External ear normal.  Left Ear: External ear normal.  Eyes: Conjunctivae and EOM are normal. Pupils are equal, round, and reactive to light.  Neck: Normal range of motion. Neck supple.  Cardiovascular: Normal rate and regular rhythm.   Pulmonary/Chest: Effort  normal and breath sounds normal.  Abd:  Soft, NT, non-distended, + BS Neurological: Pt is alert. Motor/dtr/gait intact Skin: Skin is warm. No erythema.  Psychiatric: Pt behavior is normal. Thought content c/w ST memory loss, + depressed affect    Assessment & Plan:

## 2012-05-03 NOTE — Assessment & Plan Note (Signed)
Ok to start aricept 5 qd for presumed alzheimers, pt declines extensive eval today

## 2012-05-03 NOTE — Patient Instructions (Addendum)
Take all new medications as prescribed Continue all other medications as before Please continue your efforts at being more active, low cholesterol diet, and weight control. Thank you for enrolling in MyChart. Please follow the instructions below to securely access your online medical record. MyChart allows you to send messages to your doctor, view your test results, renew your prescriptions, schedule appointments, and more. To Log into MyChart, please go to [email or web address], and your Username is:  [phone] Please keep your appointments with your specialists as you have planned Please return in 6 months, or sooner if needed

## 2012-05-03 NOTE — Assessment & Plan Note (Signed)
stable overall by hx and exam, most recent data reviewed with pt, and pt to continue medical treatment as before Lab Results  Component Value Date   WBC 5.5 02/21/2012   HGB 12.4 02/21/2012   HCT 37.2 02/21/2012   PLT 156 02/21/2012   GLUCOSE 121* 02/21/2012   CHOL 239* 07/27/2011   TRIG 127.0 07/27/2011   HDL 94.70 07/27/2011   LDLDIRECT 131.6 07/27/2011   ALT 16 02/21/2012   AST 20 02/21/2012   NA 139 02/21/2012   K 4.3 02/21/2012   CL 101 02/21/2012   CREATININE 0.8 02/21/2012   BUN 19.0 02/21/2012   CO2 31* 02/21/2012   TSH 1.80 07/27/2011   INR 0.90* 06/04/2010   HGBA1C  Value: 6.2 (NOTE)                                                                       According to the ADA Clinical Practice Recommendations for 2011, when HbA1c is used as a screening test:   >=6.5%   Diagnostic of Diabetes Mellitus           (if abnormal result  is confirmed)  5.7-6.4%   Increased risk of developing Diabetes Mellitus  References:Diagnosis and Classification of Diabetes Mellitus,Diabetes Care,2011,34(Suppl 1):S62-S69 and Standards of Medical Care in         Diabetes - 2011,Diabetes Care,2011,34  (Suppl 1):S11-S61.* 03/17/2010

## 2012-06-12 ENCOUNTER — Telehealth: Payer: Self-pay | Admitting: Oncology

## 2012-06-12 NOTE — Telephone Encounter (Signed)
Talked to patient and gave her new appt date for 06/29/12 lab and MD, appt r/s from 1/20 to 1/31 due to MD's call day

## 2012-06-15 ENCOUNTER — Telehealth: Payer: Self-pay | Admitting: Oncology

## 2012-06-15 NOTE — Telephone Encounter (Signed)
Talk to patient again gave her appt one more time

## 2012-06-18 ENCOUNTER — Ambulatory Visit: Payer: Medicare Other | Admitting: Oncology

## 2012-06-18 ENCOUNTER — Other Ambulatory Visit: Payer: Medicare Other | Admitting: Lab

## 2012-06-29 ENCOUNTER — Other Ambulatory Visit (HOSPITAL_BASED_OUTPATIENT_CLINIC_OR_DEPARTMENT_OTHER): Payer: Medicare Other | Admitting: Lab

## 2012-06-29 ENCOUNTER — Telehealth: Payer: Self-pay | Admitting: Oncology

## 2012-06-29 ENCOUNTER — Ambulatory Visit (HOSPITAL_BASED_OUTPATIENT_CLINIC_OR_DEPARTMENT_OTHER): Payer: Medicare Other | Admitting: Oncology

## 2012-06-29 ENCOUNTER — Encounter: Payer: Self-pay | Admitting: Oncology

## 2012-06-29 VITALS — BP 148/61 | HR 78 | Temp 97.4°F | Resp 20 | Ht 60.0 in | Wt 165.8 lb

## 2012-06-29 DIAGNOSIS — R3129 Other microscopic hematuria: Secondary | ICD-10-CM

## 2012-06-29 DIAGNOSIS — C8290 Follicular lymphoma, unspecified, unspecified site: Secondary | ICD-10-CM

## 2012-06-29 DIAGNOSIS — Z853 Personal history of malignant neoplasm of breast: Secondary | ICD-10-CM

## 2012-06-29 DIAGNOSIS — M810 Age-related osteoporosis without current pathological fracture: Secondary | ICD-10-CM

## 2012-06-29 DIAGNOSIS — C9110 Chronic lymphocytic leukemia of B-cell type not having achieved remission: Secondary | ICD-10-CM

## 2012-06-29 DIAGNOSIS — C50919 Malignant neoplasm of unspecified site of unspecified female breast: Secondary | ICD-10-CM

## 2012-06-29 DIAGNOSIS — F0390 Unspecified dementia without behavioral disturbance: Secondary | ICD-10-CM

## 2012-06-29 HISTORY — DX: Malignant neoplasm of unspecified site of unspecified female breast: C50.919

## 2012-06-29 LAB — URINALYSIS, MICROSCOPIC - CHCC
Ketones: NEGATIVE mg/dL
Protein: 30 mg/dL
Urobilinogen, UR: 0.2 mg/dL (ref 0.2–1)

## 2012-06-29 LAB — CBC WITH DIFFERENTIAL/PLATELET
BASO%: 0.7 % (ref 0.0–2.0)
Basophils Absolute: 0 10*3/uL (ref 0.0–0.1)
Eosinophils Absolute: 0.1 10*3/uL (ref 0.0–0.5)
HCT: 35.5 % (ref 34.8–46.6)
HGB: 12 g/dL (ref 11.6–15.9)
MONO#: 0.5 10*3/uL (ref 0.1–0.9)
NEUT#: 2.5 10*3/uL (ref 1.5–6.5)
NEUT%: 52.5 % (ref 38.4–76.8)
WBC: 4.7 10*3/uL (ref 3.9–10.3)
lymph#: 1.5 10*3/uL (ref 0.9–3.3)

## 2012-06-29 LAB — COMPREHENSIVE METABOLIC PANEL (CC13)
ALT: 12 U/L (ref 0–55)
BUN: 16.5 mg/dL (ref 7.0–26.0)
CO2: 30 mEq/L — ABNORMAL HIGH (ref 22–29)
Calcium: 9.1 mg/dL (ref 8.4–10.4)
Chloride: 100 mEq/L (ref 98–107)
Creatinine: 0.7 mg/dL (ref 0.6–1.1)

## 2012-06-29 LAB — LACTATE DEHYDROGENASE (CC13): LDH: 200 U/L (ref 125–245)

## 2012-06-29 NOTE — Patient Instructions (Signed)
Take antibiotic twice a day for 3 days for early urineinfection

## 2012-06-29 NOTE — Telephone Encounter (Signed)
appts made and printed for pt,pts daughter req to set her mammo for her so they can sch them together    Audrey Spencer

## 2012-07-02 NOTE — Progress Notes (Signed)
Hematology and Oncology Follow Up Visit  Audrey Spencer [id number] [birth year] 79 y.o. 07/02/2012 11:56 AM   Principle Diagnosis: Encounter Diagnoses  Name Primary?  . Dementia   . Lymphoma malignant, nodular, lymphocytic Yes  . Stage I breast cancer      Interim History:    Followup visit for this now 79 year old woman initially diagnosed with low-grade B-cell non-Hodgkin's lymphoma in July 1993. She achieved a durable remission lasting over 10 years with single agent cladribine chemotherapy on a clinical trial. Her disease transformed to chronic lymphocytic leukemia in 2010. She was treated with Treanda plus Rituxan for 5 cycles between March 22 and 12/10/2008 . She achieved a complete hematologic and radiographic response.  She developed a second primary stage I ER positive cancer of the left breast in April 1998 treated with lumpectomy, radiation, and then tamoxifen hormonal therapy.  Overall she continues to do well. She is having problems with recent memory loss.she's had no interim medical problems. No infections.   Medications: reviewed  Allergies:  Allergies  Allergen Reactions  . Pseudoephedrine Other (See Comments)    insomnia  . Codeine     REACTION: nausea (mild)  . Doxycycline     REACTION: Nausea  . Nsaids     REACTION: GI bleed/anemia  . Solifenacin Succinate     REACTION: blurred vision    Review of Systems: Constitutional:   No constitutional symptoms Respiratory:no cough or dyspnea Cardiovascular:  No chest pain or palpitations Gastrointestinal:no abdominal pain or change in bowel habit Genito-Urinary: no urinary tract symptoms despite results of today's urine analysis Musculoskeletal:no muscle or bone pain other than arthritis Neurologic:no headache or change in vision. Skin: Remaining ROS negative.  Physical Exam: Blood pressure 148/61, pulse 78, temperature 97.4 F (36.3 C), temperature source Oral, resp. rate 20, height 5' (1.524 m), weight  165 lb 12.8 oz (75.206 kg). Wt Readings from Last 3 Encounters:  06/29/12 165 lb 12.8 oz (75.206 kg)  05/03/12 170 lb 8 oz (77.338 kg)  02/21/12 99 lb 4.8 oz (45.042 kg)     General appearance: thin, Caucasian woman HENNT: pharynx no erythema or exudate Lymph nodes: no cervical, supraclavicular, or axillary adenopathy Breasts:nodular scar at lumpectomy site left breast upper outer quadrant. No dominant mass in either breast Lungs:Auscultation resonant to percussion Heart:regular rhythm no murmur Abdomen:soft, nontender, no mass, no organomegaly Extremities:no edema, no calf tenderness Vascular:no cyanosis Neurologic:she is alert and oriented, motor strength is 5 over 5, cranial nerves grossly normal, reflexes 1+ symmetric. Skin:no rash or ecchymosis  Lab Results: Lab Results: white count differential: 53% neutrophils, 33% lymphocytes, 12% monocytes  Component Value Date   WBC 4.7 06/29/2012   HGB 12.0 06/29/2012   HCT 35.5 06/29/2012   MCV 95.6 06/29/2012   PLT 148 06/29/2012     Chemistry      Component Value Date/Time   NA 140 06/29/2012 1029   NA 140 10/18/2011 1354   K 4.1 06/29/2012 1029   K 4.2 10/18/2011 1354   CL 100 06/29/2012 1029   CL 103 10/18/2011 1354   CO2 30* 06/29/2012 1029   CO2 31 10/18/2011 1354   BUN 16.5 06/29/2012 1029   BUN 17 10/18/2011 1354   CREATININE 0.7 06/29/2012 1029   CREATININE 0.62 10/18/2011 1354      Component Value Date/Time   CALCIUM 9.1 06/29/2012 1029   CALCIUM 8.7 10/18/2011 1354   CALCIUM 8.9 03/19/2009 2037   ALKPHOS 69 06/29/2012 1029   ALKPHOS 63 10/18/2011 1354  AST 18 06/29/2012 1029   AST 19 10/18/2011 1354   ALT 12 06/29/2012 1029   ALT 12 10/18/2011 1354   BILITOT 0.47 06/29/2012 1029   BILITOT 0.3 10/18/2011 1354       Radiological Studies:mammograms due later this month No results found.  Impression and Plan: #1. Low-grade B-cell non-Hodgkin's lymphoma treated as outlined above.  Transformation of her disease over time to  chronic lymphocytic leukemia with second complete remission with chemotherapy plus Rituxan.  Her CBC today remains stable and there is no lymphadenopathy or organomegaly on exam.  Plan: I will continue to monitor her clinical and laboratory status every 4 months.   #2. Stage I ER positive cancer of the left breast treated as outlined above.  No signs of  any new disease now out 16 years.   #3. History of upper GI bleed related to nonsteroidal anti-inflammatory drugs.   #4. Osteoporosis with history of compression fractures of the thoracic and lumbar spine requiring kyphoplasty procedures.    CC:. Dr. Kirby Funk   Levert Feinstein, MD 2/3/201411:56 AM

## 2012-07-03 ENCOUNTER — Telehealth: Payer: Self-pay | Admitting: *Deleted

## 2012-07-03 NOTE — Telephone Encounter (Signed)
Message left that mammogram is scheduled for patient on March 6th at the [facility].

## 2012-07-12 ENCOUNTER — Other Ambulatory Visit: Payer: Self-pay | Admitting: *Deleted

## 2012-07-12 MED ORDER — SULFAMETHOXAZOLE-TRIMETHOPRIM 800-160 MG PO TABS: 1.0000 | ORAL_TABLET | Freq: Two times a day (BID) | ORAL | Status: DC

## 2012-07-27 ENCOUNTER — Encounter: Payer: Medicare Other | Admitting: Internal Medicine

## 2012-08-02 ENCOUNTER — Ambulatory Visit
Admission: RE | Admit: 2012-08-02 | Discharge: 2012-08-02 | Disposition: A | Discharge status: Home / Self Care | Payer: Medicare Other | Source: Ambulatory Visit | Attending: Oncology | Admitting: Oncology

## 2012-08-02 DIAGNOSIS — C50919 Malignant neoplasm of unspecified site of unspecified female breast: Secondary | ICD-10-CM

## 2012-10-10 ENCOUNTER — Encounter: Payer: Self-pay | Admitting: Internal Medicine

## 2012-10-10 ENCOUNTER — Ambulatory Visit (INDEPENDENT_AMBULATORY_CARE_PROVIDER_SITE_OTHER): Payer: PRIVATE HEALTH INSURANCE | Admitting: Internal Medicine

## 2012-10-10 VITALS — BP 120/80 | HR 65 | Temp 98.2°F | Ht 63.0 in | Wt 163.5 lb

## 2012-10-10 DIAGNOSIS — F0390 Unspecified dementia without behavioral disturbance: Secondary | ICD-10-CM

## 2012-10-10 DIAGNOSIS — Z136 Encounter for screening for cardiovascular disorders: Secondary | ICD-10-CM

## 2012-10-10 DIAGNOSIS — Z0000 Encounter for general adult medical examination without abnormal findings: Secondary | ICD-10-CM

## 2012-10-10 MED ORDER — DONEPEZIL HCL 10 MG PO TABS: 10.0000 mg | ORAL_TABLET | Freq: Every evening | ORAL | Status: DC | PRN

## 2012-10-10 NOTE — Assessment & Plan Note (Signed)
Ok for increased aricept to 10 mg

## 2012-10-10 NOTE — Assessment & Plan Note (Signed)

## 2012-10-10 NOTE — Patient Instructions (Addendum)
OK to increase the aricept to 10 mg per day Please continue all other medications as before, and refills have been done if requested. Please have the pharmacy call with any other refills you may need. I dont think further blood tests are needed today Please continue your efforts at being more active as you can, and low cholesterol diet,  You are otherwise up to date with prevention measures today. Please return in 6 months, or sooner if needed

## 2012-10-10 NOTE — Progress Notes (Signed)
Subjective:    Patient ID: Audrey Spencer, female    DOB: June 04, 1922, 79 y.o.   MRN: [id number]  HPI   Here for wellness and f/u;  Overall doing ok;  Pt denies CP, worsening SOB, DOE, wheezing, orthopnea, PND, worsening LE edema, palpitations, dizziness or syncope.  Pt denies neurological change such as new headache, facial or extremity weakness.  Pt denies polydipsia, polyuria, or low sugar symptoms. Pt states overall good compliance with treatment and medications, good tolerability, and has been trying to follow lower cholesterol diet.  Pt denies worsening depressive symptoms, suicidal ideation or panic. No fever, night sweats, wt loss, loss of appetite, or other constitutional symptoms.  Pt states good ability with ADL's, has low fall risk, home safety reviewed and adequate, no other significant changes in hearing or vision, and only occasionally active with exercise.  Tolerating the aricept 5 mg ok Past Medical History  Diagnosis Date  . Dementia 01/25/2011  . CLL 09/22/2009  . Personal history of malignant neoplasm of breast 09/22/2009  . ANEMIA-IRON DEFICIENCY 04/09/2007  . Bronchiectasis without acute exacerbation 01/07/2009  . GERD 02/26/2008  . HYPERLIPIDEMIA 04/09/2007  . OBSTRUCTIVE SLEEP APNEA 04/22/2008  . Pancytopenia 03/19/2009  . Recurrent UTI 01/25/2011  . DIVERTICULOSIS, COLON 04/15/2010  . GLAUCOMA 04/09/2007  . 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 OSTEOPOROSIS 03/19/2009  . RECTOCELE WITHOUT MENTION OF UTERINE PROLAPSE 04/15/2010  . Depression 05/03/2012  . Stage I breast cancer 06/29/2012    Left breast stage 1 dx 08/1996;  ER positive Rx lumpectomy/RT/Tamoxifen   Past Surgical History  Procedure Laterality Date  . Breast surgery      lumpectomy  . Abdominal hysterectomy    . Rotater cuff      reports that she has never smoked. She does not have any smokeless tobacco history on file. She reports that she does not drink alcohol or use illicit drugs. family history  includes Cancer in her brother and sister and Hypertension in her father. Allergies  Allergen Reactions  . Pseudoephedrine Other (See Comments)    insomnia  . Codeine     REACTION: nausea (mild)  . Doxycycline     REACTION: Nausea  . Nsaids     REACTION: GI bleed/anemia  . Solifenacin Succinate     REACTION: blurred vision  ' Current Outpatient Prescriptions on File Prior to Visit  Medication Sig Dispense Refill  . aspirin 325 MG tablet Take 325 mg by mouth daily.      . AZOPT 1 % ophthalmic suspension Place 1 drop into the right eye Twice daily.      . calcium carbonate (OS-CAL) 600 MG TABS Take 600 mg by mouth daily.      . citalopram (CELEXA) 10 MG tablet Take 1 tablet (10 mg total) by mouth daily.  90 tablet  3  . ferrous sulfate 325 (65 FE) MG tablet Take 325 mg by mouth daily with breakfast.      . Melatonin 3 MG CAPS Take by mouth at bedtime.      . Multiple Vitamin (MULTIVITAMIN) tablet Take 1 tablet by mouth daily.      Marland Kitchen omeprazole (PRILOSEC) 20 MG capsule TAKE ONE CAPSULE BY MOUTH DAILY  60 capsule  3  . sulfamethoxazole-trimethoprim (BACTRIM DS,SEPTRA DS) 800-160 MG per tablet Take 1 tablet by mouth 2 (two) times daily. X 3 days  6 tablet  1   No current facility-administered medications on file prior to visit.   Review of Systems Constitutional:  Negative for diaphoresis, activity change, appetite change or unexpected weight change.  HENT: Negative for hearing loss, ear pain, facial swelling, mouth sores and neck stiffness.   Eyes: Negative for pain, redness and visual disturbance.  Respiratory: Negative for shortness of breath and wheezing.   Cardiovascular: Negative for chest pain and palpitations.  Gastrointestinal: Negative for diarrhea, blood in stool, abdominal distention or other pain Genitourinary: Negative for hematuria, flank pain or change in urine volume.  Musculoskeletal: Negative for myalgias and joint swelling.  Skin: Negative for color change and  wound.  Neurological: Negative for syncope and numbness. other than noted Hematological: Negative for adenopathy.  Psychiatric/Behavioral: Negative for hallucinations, self-injury, decreased concentration and agitation.      Objective:   Physical Exam BP 120/80  Pulse 65  Temp(Src) 98.2 F (36.8 C) (Oral)  Ht 5\' 3"  (1.6 m)  Wt 163 lb 8 oz (74.163 kg)  BMI 28.97 kg/m2  SpO2 93% VS noted,  Constitutional: Pt is oriented to person, place, and time. Appears well-developed and well-nourished.  Head: Normocephalic and atraumatic.  Right Ear: External ear normal.  Left Ear: External ear normal.  Nose: Nose normal.  Mouth/Throat: Oropharynx is clear and moist.  Eyes: Conjunctivae and EOM are normal. Pupils are equal, round, and reactive to light.  Neck: Normal range of motion. Neck supple. No JVD present. No tracheal deviation present.  Cardiovascular: Normal rate, regular rhythm, normal heart sounds and intact distal pulses.   Pulmonary/Chest: Effort normal and breath sounds normal.  Abdominal: Soft. Bowel sounds are normal. There is no tenderness. No HSM  Musculoskeletal: Normal range of motion. Exhibits no edema.  Lymphadenopathy:  Has no cervical adenopathy.  Neurological: Pt is alert and oriented to person, place, and time. Pt has normal reflexes. No cranial nerve deficit.  Skin: Skin is warm and dry. No rash noted.  Psychiatric:  Has  normal mood and affect. Behavior is normal.     Assessment & Plan:

## 2012-10-26 ENCOUNTER — Other Ambulatory Visit (HOSPITAL_BASED_OUTPATIENT_CLINIC_OR_DEPARTMENT_OTHER): Payer: PRIVATE HEALTH INSURANCE | Admitting: Lab

## 2012-10-26 ENCOUNTER — Ambulatory Visit (HOSPITAL_BASED_OUTPATIENT_CLINIC_OR_DEPARTMENT_OTHER): Payer: PRIVATE HEALTH INSURANCE | Admitting: Nurse Practitioner

## 2012-10-26 ENCOUNTER — Telehealth: Payer: Self-pay | Admitting: Oncology

## 2012-10-26 ENCOUNTER — Ambulatory Visit (HOSPITAL_BASED_OUTPATIENT_CLINIC_OR_DEPARTMENT_OTHER): Payer: PRIVATE HEALTH INSURANCE | Admitting: Lab

## 2012-10-26 VITALS — BP 157/68 | HR 80 | Temp 96.7°F | Resp 20 | Ht 63.0 in | Wt 163.5 lb

## 2012-10-26 DIAGNOSIS — R3915 Urgency of urination: Secondary | ICD-10-CM

## 2012-10-26 DIAGNOSIS — C8290 Follicular lymphoma, unspecified, unspecified site: Secondary | ICD-10-CM

## 2012-10-26 DIAGNOSIS — C8589 Other specified types of non-Hodgkin lymphoma, extranodal and solid organ sites: Secondary | ICD-10-CM

## 2012-10-26 DIAGNOSIS — R350 Frequency of micturition: Secondary | ICD-10-CM

## 2012-10-26 DIAGNOSIS — C9110 Chronic lymphocytic leukemia of B-cell type not having achieved remission: Secondary | ICD-10-CM

## 2012-10-26 LAB — URINALYSIS, MICROSCOPIC - CHCC
Glucose: NEGATIVE mg/dL
Nitrite: NEGATIVE
Protein: <30 mg/dL
Specific Gravity, Urine: 1.02 (ref 1.003–1.035)
Urobilinogen, UR: 0.2 mg/dL (ref 0.2–1)

## 2012-10-26 LAB — CBC WITH DIFFERENTIAL/PLATELET
Basophils Absolute: 0 10*3/uL (ref 0.0–0.1)
Eosinophils Absolute: 0.1 10*3/uL (ref 0.0–0.5)
HGB: 12.2 g/dL (ref 11.6–15.9)
LYMPH%: 30.4 % (ref 14.0–49.7)
MCV: 96.1 fL (ref 79.5–101.0)
MONO%: 9.1 % (ref 0.0–14.0)
NEUT#: 2.6 10*3/uL (ref 1.5–6.5)
Platelets: 151 10*3/uL (ref 145–400)
RBC: 3.8 10*6/uL (ref 3.70–5.45)

## 2012-10-26 NOTE — Progress Notes (Signed)
OFFICE PROGRESS NOTE  Interval history:  Audrey Spencer returns as scheduled. To review, she is an 79 year old woman initially diagnosed with a low-grade B-cell non-Hodgkin's lymphoma in July 1993. She was initially treated with single agent cladribine achieving a durable remission lasting over 10 years. Disease transformed to chronic lymphocytic leukemia in 2010. She was treated with bendamustine/Rituxan for 5 cycles between 08/18/2008 and 12/10/2008 achieving a complete hematologic and radiographic response.   She was diagnosed with a second primary stage I ER positive cancer of the left breast in April 1998 treated with lumpectomy, radiation and tamoxifen hormonal therapy.  She is seen today for scheduled followup. She feels well. No fevers or sweats. She has a good appetite. She denies weight loss. No pain. No interim illnesses or infections. No cough or shortness of breath. Her only complaint is recent urinary frequency and urgency. She denies dysuria.  She has had progressive memory decline and recently began a trial of Aricept.   Objective: Blood pressure 157/68, pulse 80, temperature 96.7 F (35.9 C), temperature source Oral, resp. rate 20, height 5\' 3"  (1.6 m), weight 163 lb 8 oz (74.163 kg).  No thrush or ulcerations. No palpable cervical, supraclavicular, axillary or inguinal lymph nodes. Faint rales at both lung bases. Regular cardiac rhythm. Abdomen soft and nontender. No organomegaly. Trace lower leg edema bilaterally. Calves soft and nontender.  Lab Results: Lab Results  Component Value Date   WBC 4.6 10/26/2012   HGB 12.2 10/26/2012   HCT 36.6 10/26/2012   MCV 96.1 10/26/2012   PLT 151 10/26/2012    Chemistry:    Chemistry      Component Value Date/Time   NA 140 06/29/2012 1029   NA 140 10/18/2011 1354   K 4.1 06/29/2012 1029   K 4.2 10/18/2011 1354   CL 100 06/29/2012 1029   CL 103 10/18/2011 1354   CO2 30* 06/29/2012 1029   CO2 31 10/18/2011 1354   BUN 16.5 06/29/2012 1029    BUN 17 10/18/2011 1354   CREATININE 0.7 06/29/2012 1029   CREATININE 0.62 10/18/2011 1354      Component Value Date/Time   CALCIUM 9.1 06/29/2012 1029   CALCIUM 8.7 10/18/2011 1354   CALCIUM 8.9 03/19/2009 2037   ALKPHOS 69 06/29/2012 1029   ALKPHOS 63 10/18/2011 1354   AST 18 06/29/2012 1029   AST 19 10/18/2011 1354   ALT 12 06/29/2012 1029   ALT 12 10/18/2011 1354   BILITOT 0.47 06/29/2012 1029   BILITOT 0.3 10/18/2011 1354       Studies/Results: No results found.  Medications: I have reviewed the patient's current medications.  Assessment/Plan:  1. Low grade B-cell non-Hodgkin's lymphoma diagnosed December 16, 1991. She achieved a durable remission lasting for over 10 years with single-agent cladribine given on a clinical trial. Transformation to CLL in 2010 with rising white count and lymphocyte percent. She was treated with a combination of Treanda plus Rituxan for 5 cycles between 03/22 and 12/10/2008. She achieved a complete hematologic and radiographic response. CT scan done 07/28/2010 showed no adenopathy or splenomegaly.  2. Stage I ER positive cancer of the left breast diagnosed April 1998, treated with lumpectomy, radiation and tamoxifen hormonal therapy. Most recent mammogram 08/06/2012 showed no evidence of malignancy. 3. Osteoporosis with history of compression fractures of thoracic and lumbar spine requiring kyphoplasty.  4. History of upper GI bleed in the past secondary to nonsteroidal anti-inflammatory drugs. 5. Urinary frequency and urgency. She will return to the lab for a  urinalysis and culture.  Disposition-Audrey Spencer appears stable. We will continue to follow with routine labs and office visits. She will return for the next visit in 4 months. She will contact the office in the interim with any problems.  Lonna Cobb ANP/GNP-BC

## 2012-10-26 NOTE — Telephone Encounter (Signed)
gv and printeda ppt sched and avs for pt....sent pt back to the lab....  °

## 2012-10-27 LAB — URINE CULTURE

## 2012-11-02 ENCOUNTER — Telehealth: Payer: Self-pay | Admitting: Nurse Practitioner

## 2012-11-02 NOTE — Telephone Encounter (Signed)
I notified Ms. Lutz's niece, Bonita Quin, of the negative urine culture result. They will contact Dr. Patsi Sears of the symptoms she is experiencing.

## 2012-12-24 ENCOUNTER — Other Ambulatory Visit: Payer: Self-pay | Admitting: Nurse Practitioner

## 2012-12-24 DIAGNOSIS — C8299 Follicular lymphoma, unspecified, extranodal and solid organ sites: Secondary | ICD-10-CM

## 2013-02-26 ENCOUNTER — Other Ambulatory Visit (HOSPITAL_BASED_OUTPATIENT_CLINIC_OR_DEPARTMENT_OTHER): Payer: PRIVATE HEALTH INSURANCE | Admitting: Lab

## 2013-02-26 ENCOUNTER — Ambulatory Visit (HOSPITAL_BASED_OUTPATIENT_CLINIC_OR_DEPARTMENT_OTHER): Payer: PRIVATE HEALTH INSURANCE | Admitting: Oncology

## 2013-02-26 ENCOUNTER — Telehealth: Payer: Self-pay | Admitting: Oncology

## 2013-02-26 DIAGNOSIS — C8290 Follicular lymphoma, unspecified, unspecified site: Secondary | ICD-10-CM

## 2013-02-26 DIAGNOSIS — C50912 Malignant neoplasm of unspecified site of left female breast: Secondary | ICD-10-CM

## 2013-02-26 DIAGNOSIS — C9110 Chronic lymphocytic leukemia of B-cell type not having achieved remission: Secondary | ICD-10-CM

## 2013-02-26 DIAGNOSIS — Z853 Personal history of malignant neoplasm of breast: Secondary | ICD-10-CM

## 2013-02-26 DIAGNOSIS — Z23 Encounter for immunization: Secondary | ICD-10-CM

## 2013-02-26 LAB — CBC WITH DIFFERENTIAL/PLATELET
BASO%: 0.7 % (ref 0.0–2.0)
Basophils Absolute: 0 10*3/uL (ref 0.0–0.1)
HCT: 38.3 % (ref 34.8–46.6)
HGB: 12.7 g/dL (ref 11.6–15.9)
LYMPH%: 33.3 % (ref 14.0–49.7)
MCHC: 33.2 g/dL (ref 31.5–36.0)
MONO#: 0.5 10*3/uL (ref 0.1–0.9)
NEUT%: 54.6 % (ref 38.4–76.8)
Platelets: 152 10*3/uL (ref 145–400)
WBC: 6.1 10*3/uL (ref 3.9–10.3)
lymph#: 2 10*3/uL (ref 0.9–3.3)

## 2013-02-26 MED ORDER — INFLUENZA VAC SPLIT QUAD 0.5 ML IM SUSP
0.5000 mL | Freq: Once | INTRAMUSCULAR | Status: AC
  Administered 2013-02-26: 0.5 mL via INTRAMUSCULAR
  Filled 2013-02-26: qty 0.5

## 2013-02-26 NOTE — Telephone Encounter (Signed)
Gave pt appt for lab and ML for January 2015 °

## 2013-02-27 NOTE — Progress Notes (Signed)
Hematology and Oncology Follow Up Visit  Audrey Spencer [id number] 06/04/1922 79 y.o. 02/27/2013 1:25 PM   Principle Diagnosis: Encounter Diagnoses  Name Primary?  . Lymphoma malignant, nodular, lymphocytic Yes  . Stage I breast cancer, left   . CLL (chronic lymphocytic leukemia)      Interim History:   Followup visit for this  79 year old woman initially diagnosed with low-grade B-cell non-Hodgkin's lymphoma in July 1993. She achieved a durable remission lasting over 10 years with single agent cladribine chemotherapy on a clinical trial. Her disease transformed to chronic lymphocytic leukemia in 2010. She was treated with Treanda plus Rituxan for 5 cycles between March 22 and 12/10/2008 . She achieved a complete hematologic and radiographic response which she maintains at this time. She was diagnosed with a stage I,  ER positive,  cancer of the left breast in April 1998 treated with lumpectomy, radiation, and then tamoxifen hormonal therapy. Overall she continues to do very well for her age. She will be 79 years old next month. She has developed progressive memory loss. She is still living independently and her niece keeps a close eye on her and accompanies her with all of her visits. She has had some friends staying with her recently for the last few weeks and they have been helping her as well. Overall, her weight has been stable and appetite is good. No fevers, no recent infections. She is now on treatment for glaucoma.   Medications: reviewed  Allergies:  Allergies  Allergen Reactions  . Pseudoephedrine Other (See Comments)    insomnia  . Codeine     REACTION: nausea (mild)  . Doxycycline     REACTION: Nausea  . Nsaids     REACTION: GI bleed/anemia  . Solifenacin Succinate     REACTION: blurred vision    Review of Systems: Constitutional:   See above HEENT no sore throat Respiratory: Intermittent mild cough, no dyspnea, no orthopnea Cardiovascular:  No chest pain, no  palpitations, no edema Gastrointestinal: No abdominal pain, no change in bowel habit, no hematochezia or melena Genito-Urinary: No urinary tract symptoms Musculoskeletal: Minor arthritis discomfort Neurologic: No headache or change in vision, no focal weakness, she is on Namenda Skin: No rash or ecchymosis  Remaining ROS negative.    Physical Exam: There were no vitals taken for this visit. Wt Readings from Last 3 Encounters:  10/26/12 163 lb 8 oz (74.163 kg)  10/10/12 163 lb 8 oz (74.163 kg)  06/29/12 165 lb 12.8 oz (75.206 kg)     General appearance: Thin Caucasian woman HENNT: Pharynx no erythema exudate or mass, no thyromegaly Lymph nodes: No cervical, supraclavicular, axillary, or inguinal adenopathy Breasts: Surgical changes left breast. No dominant mass in either breast Lungs: Clear to auscultation resonant to percussion Heart: Abdomen: Soft, nontender, no mass, no organomegaly Extremities: Extremities no edema no calf tenderness Musculoskeletal: No joint deformities GU: Vascular: No carotid bruits, no cyanosis Neurologic: Mild deficit in recent memory but good remote memory. Motor strength 5 over 5. PERRLA. Skin: No rash or ecchymosis  Lab Results: CBC W/Diff  White count differential: 55% neutrophils, 33% lymphocytes, 9% monocytes   Component Value Date/Time   WBC 6.1 02/26/2013 1130   WBC 4.3* 07/02/2010 1434   RBC 3.97 02/26/2013 1130   RBC 3.41* 07/02/2010 1434   RBC 3.28* 03/17/2010 0042   HGB 12.7 02/26/2013 1130   HGB 11.4* 07/02/2010 1434   HCT 38.3 02/26/2013 1130   HCT 33.6* 07/02/2010 1434   PLT 152  02/26/2013 1130   PLT 162.0 07/02/2010 1434   MCV 96.5 02/26/2013 1130   MCV 98.5 07/02/2010 1434   MCH 32.0 02/26/2013 1130   MCH 32.2 06/04/2010 1212   MCHC 33.2 02/26/2013 1130   MCHC 34.0 07/02/2010 1434   RDW 15.2* 02/26/2013 1130   RDW 16.4* 07/02/2010 1434   LYMPHSABS 2.0 02/26/2013 1130   LYMPHSABS 1.4 07/02/2010 1434   MONOABS 0.5 02/26/2013 1130   MONOABS 0.5  07/02/2010 1434   EOSABS 0.2 02/26/2013 1130   EOSABS 0.2 07/02/2010 1434   BASOSABS 0.0 02/26/2013 1130   BASOSABS 0.0 07/02/2010 1434     Chemistry      Component Value Date/Time   NA 140 06/29/2012 1029   NA 140 10/18/2011 1354   K 4.1 06/29/2012 1029   K 4.2 10/18/2011 1354   CL 100 06/29/2012 1029   CL 103 10/18/2011 1354   CO2 30* 06/29/2012 1029   CO2 31 10/18/2011 1354   BUN 16.5 06/29/2012 1029   BUN 17 10/18/2011 1354   CREATININE 0.7 06/29/2012 1029   CREATININE 0.62 10/18/2011 1354      Component Value Date/Time   CALCIUM 9.1 06/29/2012 1029   CALCIUM 8.7 10/18/2011 1354   CALCIUM 8.9 03/19/2009 2037   ALKPHOS 69 06/29/2012 1029   ALKPHOS 63 10/18/2011 1354   AST 18 06/29/2012 1029   AST 19 10/18/2011 1354   ALT 12 06/29/2012 1029   ALT 12 10/18/2011 1354   BILITOT 0.47 06/29/2012 1029   BILITOT 0.3 10/18/2011 1354       Radiological Studies: Most recent mammogram 08/02/2012 with no new disease   Impression: #1. Low-grade B-cell non-Hodgkin's lymphoma treated as outlined above.  Transformation of her disease over time to chronic lymphocytic leukemia with second complete remission with chemotherapy plus Rituxan.  Her CBC today remains stable and there is no lymphadenopathy or organomegaly on exam.  Plan: I will continue to monitor her clinical and laboratory status every 4 months.   #2. Stage I ER positive cancer of the left breast treated as outlined above.  No signs of any new disease now out 16 years.   #3. History of upper GI bleed related to nonsteroidal anti-inflammatory drugs.   #4. Osteoporosis with history of compression fractures of the thoracic and lumbar spine requiring kyphoplasty procedures.  #5. Mild dementia   #6. Glaucoma  We gave her a flu vaccine today.    CC:. Dr. Tiney Rouge, MD 10/1/20141:25 PM

## 2013-04-12 ENCOUNTER — Encounter: Payer: Self-pay | Admitting: Internal Medicine

## 2013-04-12 ENCOUNTER — Ambulatory Visit (INDEPENDENT_AMBULATORY_CARE_PROVIDER_SITE_OTHER): Payer: PRIVATE HEALTH INSURANCE | Admitting: Internal Medicine

## 2013-04-12 VITALS — BP 120/80 | HR 86 | Temp 97.0°F | Ht 63.0 in | Wt 163.2 lb

## 2013-04-12 DIAGNOSIS — Z23 Encounter for immunization: Secondary | ICD-10-CM

## 2013-04-12 DIAGNOSIS — M79609 Pain in unspecified limb: Secondary | ICD-10-CM

## 2013-04-12 DIAGNOSIS — Z0000 Encounter for general adult medical examination without abnormal findings: Secondary | ICD-10-CM

## 2013-04-12 DIAGNOSIS — M79642 Pain in left hand: Secondary | ICD-10-CM

## 2013-04-12 NOTE — Progress Notes (Signed)
Pre-visit discussion using our clinic review tool. No additional management support is needed unless otherwise documented below in the visit note.  

## 2013-04-12 NOTE — Patient Instructions (Addendum)
You had the new Prevnar Pneumonia shot today OK to stop the aricept Please continue all other medications as before, and refills have been done if requested. Please have the pharmacy call with any other refills you may need. Please continue your efforts at being more active, low cholesterol diet, and weight control. You are otherwise up to date with prevention measures today.  Please go to the LAB in the Basement (turn left off the elevator) for the tests to be done today  You will be contacted by phone if any changes need to be made immediately.  Otherwise, you will receive a letter about your results with an explanation, but please check with MyChart first.  You will be contacted regarding the referral for: hand surgury for the left hand tendonitis  Please keep your appointments with your specialists as you have planned - Dr Cyndie Chime  Please return in 6 months, or sooner if needed

## 2013-04-12 NOTE — Progress Notes (Signed)
Subjective:    Patient ID: Audrey Spencer, female    DOB: 01/25/1923, 79 y.o.   MRN: [id number]  HPI  Here for wellness and f/u;  Overall doing ok;  Pt denies CP, worsening SOB, DOE, wheezing, orthopnea, PND, worsening LE edema, palpitations, dizziness or syncope.  Pt denies neurological change such as new headache, facial or extremity weakness.  Pt denies polydipsia, polyuria, or low sugar symptoms. Pt states overall good compliance with treatment and medications, good tolerability, and has been trying to follow lower cholesterol diet.  Pt denies worsening depressive symptoms, suicidal ideation or panic. No fever, night sweats, wt loss, loss of appetite, or other constitutional symptoms.  Pt states good ability with ADL's, has low fall risk, home safety reviewed and adequate, no other significant changes in hearing or vision, and only occasionally active with exercise. No longer wants to take the aricept 10 mg, did not seem to help much, or other med.  Has difficulty with left hand palm pain and inability to extend fingers Past Medical History  Diagnosis Date  . Dementia 01/25/2011  . CLL 09/22/2009  . Personal history of malignant neoplasm of breast 09/22/2009  . ANEMIA-IRON DEFICIENCY 04/09/2007  . Bronchiectasis without acute exacerbation 01/07/2009  . GERD 02/26/2008  . HYPERLIPIDEMIA 04/09/2007  . OBSTRUCTIVE SLEEP APNEA 04/22/2008  . Pancytopenia 03/19/2009  . Recurrent UTI 01/25/2011  . DIVERTICULOSIS, COLON 04/15/2010  . GLAUCOMA 04/09/2007  . 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 OSTEOPOROSIS 03/19/2009  . RECTOCELE WITHOUT MENTION OF UTERINE PROLAPSE 04/15/2010  . Depression 05/03/2012  . Stage I breast cancer 06/29/2012    Left breast stage 1 dx 08/1996;  ER positive Rx lumpectomy/RT/Tamoxifen   Past Surgical History  Procedure Laterality Date  . Breast surgery      lumpectomy  . Abdominal hysterectomy    . Rotater cuff      reports that she has never smoked. She does not have  any smokeless tobacco history on file. She reports that she does not drink alcohol or use illicit drugs. family history includes Cancer in her brother and sister; Hypertension in her father. Allergies  Allergen Reactions  . Pseudoephedrine Other (See Comments)    insomnia  . Codeine     REACTION: nausea (mild)  . Doxycycline     REACTION: Nausea  . Nsaids     REACTION: GI bleed/anemia  . Solifenacin Succinate     REACTION: blurred vision   Current Outpatient Prescriptions on File Prior to Visit  Medication Sig Dispense Refill  . aspirin 325 MG tablet Take 325 mg by mouth daily.      . AZOPT 1 % ophthalmic suspension Place 1 drop into the right eye Twice daily.      . calcium carbonate (OS-CAL) 600 MG TABS Take 600 mg by mouth daily.      . citalopram (CELEXA) 10 MG tablet Take 1 tablet (10 mg total) by mouth daily.  90 tablet  3  . ferrous sulfate 325 (65 FE) MG tablet Take 325 mg by mouth daily with breakfast.      . latanoprost (XALATAN) 0.005 % ophthalmic solution Place into the left eye at bedtime.      . Melatonin 3 MG CAPS Take by mouth at bedtime.      . Multiple Vitamin (MULTIVITAMIN) tablet Take 1 tablet by mouth daily.      Marland Kitchen omeprazole (PRILOSEC) 20 MG capsule TAKE 1 CAPSULE BY MOUTH EVERY DAY  60 capsule  3  No current facility-administered medications on file prior to visit.    Review of Systems Constitutional: Negative for diaphoresis, activity change, appetite change or unexpected weight change.  HENT: Negative for hearing loss, ear pain, facial swelling, mouth sores and neck stiffness.   Eyes: Negative for pain, redness and visual disturbance.  Respiratory: Negative for shortness of breath and wheezing.   Cardiovascular: Negative for chest pain and palpitations.  Gastrointestinal: Negative for diarrhea, blood in stool, abdominal distention or other pain Genitourinary: Negative for hematuria, flank pain or change in urine volume.  Musculoskeletal: Negative for  myalgias and joint swelling.  Skin: Negative for color change and wound.  Neurological: Negative for syncope and numbness. other than noted Hematological: Negative for adenopathy.  Psychiatric/Behavioral: Negative for hallucinations, self-injury, decreased concentration and agitation.      Objective:   Physical Exam BP 120/80  Pulse 86  Temp(Src) 97 F (36.1 C) (Oral)  Ht 5\' 3"  (1.6 m)  Wt 163 lb 4 oz (74.05 kg)  BMI 28.93 kg/m2  SpO2 93% VS noted,  Constitutional: Pt is oriented to person, place, and time. Appears well-developed and well-nourished.  Head: Normocephalic and atraumatic.  Right Ear: External ear normal.  Left Ear: External ear normal.  Nose: Nose normal.  Mouth/Throat: Oropharynx is clear and moist.  Eyes: Conjunctivae and EOM are normal. Pupils are equal, round, and reactive to light.  Neck: Normal range of motion. Neck supple. No JVD present. No tracheal deviation present.  Cardiovascular: Normal rate, regular rhythm, normal heart sounds and intact distal pulses.   Pulmonary/Chest: Effort normal and breath sounds normal.  Abdominal: Soft. Bowel sounds are normal. There is no tenderness. No HSM  Musculoskeletal: Normal range of motion. Exhibits no edema.  Lymphadenopathy:  Has no cervical adenopathy.  Neurological: Pt is alert and oriented to person, place, and time. Pt has normal reflexes. No cranial nerve deficit.  Skin: Skin is warm and dry. No rash noted.  Psychiatric:  Has  normal mood and affect. Behavior is normal.  Left palm with tendinosis and unable to fullextend third and fourth fingers    Assessment & Plan:

## 2013-04-14 DIAGNOSIS — M79642 Pain in left hand: Secondary | ICD-10-CM | POA: Insufficient documentation

## 2013-04-14 NOTE — Assessment & Plan Note (Signed)

## 2013-04-14 NOTE — Assessment & Plan Note (Signed)
For hand surgury referral 

## 2013-06-25 ENCOUNTER — Ambulatory Visit: Payer: PRIVATE HEALTH INSURANCE | Admitting: Nurse Practitioner

## 2013-06-25 ENCOUNTER — Telehealth: Payer: Self-pay | Admitting: Oncology

## 2013-06-25 ENCOUNTER — Other Ambulatory Visit: Payer: PRIVATE HEALTH INSURANCE

## 2013-06-25 NOTE — Telephone Encounter (Signed)
Pt's daughter called and r/s lab and ML appt today to Sofie Hartigan aware

## 2013-06-28 ENCOUNTER — Telehealth: Payer: Self-pay | Admitting: Oncology

## 2013-06-28 ENCOUNTER — Ambulatory Visit (HOSPITAL_BASED_OUTPATIENT_CLINIC_OR_DEPARTMENT_OTHER): Payer: PRIVATE HEALTH INSURANCE | Admitting: Nurse Practitioner

## 2013-06-28 ENCOUNTER — Other Ambulatory Visit (HOSPITAL_BASED_OUTPATIENT_CLINIC_OR_DEPARTMENT_OTHER): Payer: PRIVATE HEALTH INSURANCE

## 2013-06-28 VITALS — BP 172/82 | HR 84 | Temp 96.4°F | Resp 18 | Ht 63.0 in | Wt 158.2 lb

## 2013-06-28 DIAGNOSIS — C8589 Other specified types of non-Hodgkin lymphoma, extranodal and solid organ sites: Secondary | ICD-10-CM

## 2013-06-28 DIAGNOSIS — C8290 Follicular lymphoma, unspecified, unspecified site: Secondary | ICD-10-CM

## 2013-06-28 DIAGNOSIS — C50919 Malignant neoplasm of unspecified site of unspecified female breast: Secondary | ICD-10-CM

## 2013-06-28 DIAGNOSIS — C50912 Malignant neoplasm of unspecified site of left female breast: Secondary | ICD-10-CM

## 2013-06-28 DIAGNOSIS — C9110 Chronic lymphocytic leukemia of B-cell type not having achieved remission: Secondary | ICD-10-CM

## 2013-06-28 DIAGNOSIS — N6489 Other specified disorders of breast: Secondary | ICD-10-CM

## 2013-06-28 DIAGNOSIS — N644 Mastodynia: Secondary | ICD-10-CM

## 2013-06-28 DIAGNOSIS — M810 Age-related osteoporosis without current pathological fracture: Secondary | ICD-10-CM

## 2013-06-28 DIAGNOSIS — Z853 Personal history of malignant neoplasm of breast: Secondary | ICD-10-CM

## 2013-06-28 LAB — CBC WITH DIFFERENTIAL/PLATELET
BASO%: 0.8 % (ref 0.0–2.0)
BASOS ABS: 0.1 10*3/uL (ref 0.0–0.1)
EOS ABS: 0.2 10*3/uL (ref 0.0–0.5)
EOS%: 2.1 % (ref 0.0–7.0)
HEMATOCRIT: 38.4 % (ref 34.8–46.6)
HGB: 12.9 g/dL (ref 11.6–15.9)
LYMPH#: 3 10*3/uL (ref 0.9–3.3)
LYMPH%: 42.2 % (ref 14.0–49.7)
MCH: 33.3 pg (ref 25.1–34.0)
MCHC: 33.5 g/dL (ref 31.5–36.0)
MCV: 99.6 fL (ref 79.5–101.0)
MONO#: 0.6 10*3/uL (ref 0.1–0.9)
MONO%: 7.9 % (ref 0.0–14.0)
NEUT%: 47 % (ref 38.4–76.8)
NEUTROS ABS: 3.3 10*3/uL (ref 1.5–6.5)
Platelets: 144 10*3/uL — ABNORMAL LOW (ref 145–400)
RBC: 3.86 10*6/uL (ref 3.70–5.45)
RDW: 15.1 % — ABNORMAL HIGH (ref 11.2–14.5)
WBC: 7.1 10*3/uL (ref 3.9–10.3)

## 2013-06-28 LAB — COMPREHENSIVE METABOLIC PANEL (CC13)
ALT: 15 U/L (ref 0–55)
ANION GAP: 8 meq/L (ref 3–11)
AST: 19 U/L (ref 5–34)
Albumin: 3.8 g/dL (ref 3.5–5.0)
Alkaline Phosphatase: 57 U/L (ref 40–150)
BUN: 17 mg/dL (ref 7.0–26.0)
CALCIUM: 9.2 mg/dL (ref 8.4–10.4)
CHLORIDE: 103 meq/L (ref 98–109)
CO2: 31 meq/L — AB (ref 22–29)
CREATININE: 0.7 mg/dL (ref 0.6–1.1)
GLUCOSE: 123 mg/dL (ref 70–140)
Potassium: 4.1 mEq/L (ref 3.5–5.1)
Sodium: 142 mEq/L (ref 136–145)
Total Bilirubin: 0.47 mg/dL (ref 0.20–1.20)
Total Protein: 6.3 g/dL — ABNORMAL LOW (ref 6.4–8.3)

## 2013-06-28 LAB — LACTATE DEHYDROGENASE (CC13): LDH: 202 U/L (ref 125–245)

## 2013-06-28 NOTE — Progress Notes (Signed)
OFFICE PROGRESS NOTE  Interval history:  Audrey Spencer is an 80 year old woman initially diagnosed with a low-grade B-cell non-Hodgkin's lymphoma in July 1993. She was initially treated with single agent cladribine achieving a durable remission lasting over 10 years. Disease transformed to chronic lymphocytic leukemia in 2010. She was treated with bendamustine/Rituxan for 5 cycles between 08/18/2008 and 12/10/2008 achieving a complete hematologic and radiographic response.   She was diagnosed with a second primary stage I ER positive cancer of the left breast in April 1998 treated with lumpectomy, radiation and tamoxifen. Mammogram 08/06/2012 showed no evidence of malignancy.  She is seen today for scheduled followup. No interim illnesses or infections. No fevers or sweats. She states her appetite is "great". She has stable chronic back pain. No bowel or bladder problems. Family notes progressive memory loss. Audrey Spencer has noted a change in the left breast at the upper-outer quadrant.    Objective: Filed Vitals:   06/28/13 0929  BP: 172/82  Pulse: 84  Temp: 96.4 F (35.8 C)  Resp: 18   Oropharynx is without thrush or ulceration. No palpable cervical, supraclavicular, axillary or inguinal lymph nodes. No mass palpated in either breast. Both breasts with marked tenderness. Faint rales at both lung bases. Regular cardiac rhythm. Abdomen soft and nontender. No organomegaly. No leg edema. Calves soft and nontender.   Lab Results: Lab Results  Component Value Date   WBC 7.1 06/28/2013   HGB 12.9 06/28/2013   HCT 38.4 06/28/2013   MCV 99.6 06/28/2013   PLT 144* 06/28/2013   NEUTROABS 3.3 06/28/2013    Chemistry:    Chemistry      Component Value Date/Time   NA 142 06/28/2013 0910   NA 140 10/18/2011 1354   K 4.1 06/28/2013 0910   K 4.2 10/18/2011 1354   CL 100 06/29/2012 1029   CL 103 10/18/2011 1354   CO2 31* 06/28/2013 0910   CO2 31 10/18/2011 1354   BUN 17.0 06/28/2013 0910   BUN 17  10/18/2011 1354   CREATININE 0.7 06/28/2013 0910   CREATININE 0.62 10/18/2011 1354      Component Value Date/Time   CALCIUM 9.2 06/28/2013 0910   CALCIUM 8.7 10/18/2011 1354   CALCIUM 8.9 03/19/2009 2037   ALKPHOS 57 06/28/2013 0910   ALKPHOS 63 10/18/2011 1354   AST 19 06/28/2013 0910   AST 19 10/18/2011 1354   ALT 15 06/28/2013 0910   ALT 12 10/18/2011 1354   BILITOT 0.47 06/28/2013 0910   BILITOT 0.3 10/18/2011 1354       Studies/Results: No results found.  Medications: I have reviewed the patient's current medications.  Assessment/Plan: 1. Low grade B-cell non-Hodgkin's lymphoma diagnosed December 16, 1991. She achieved a durable remission lasting for over 10 years with single-agent cladribine given on a clinical trial. Transformation to CLL in 2010 with rising white count and lymphocyte percent. She was treated with a combination of Treanda plus Rituxan for 5 cycles between 03/22 and 12/10/2008. She achieved a complete hematologic and radiographic response. CT scan done 07/28/2010 showed no adenopathy or splenomegaly.  2. Stage I ER positive cancer of the left breast diagnosed April 1998, treated with lumpectomy, radiation and tamoxifen hormonal therapy. Most recent mammogram 08/06/2012 showed no evidence of malignancy. 3. Osteoporosis with history of compression fractures of thoracic and lumbar spine requiring kyphoplasty.  4. History of upper GI bleed in the past secondary to nonsteroidal anti-inflammatory drugs.  Dispositon-she remains in clinical remission from low-grade lymphoma/CLL.   Audrey Spencer  detects an abnormality in the left breast at the upper outer quadrant. She is nearly due for her annual mammogram. We will go ahead and make a referral for a mammogram.  She was informed at today's visit that Dr. Beryle Beams is leaving the practice. Her care will be reassigned to Dr. Alvy Bimler. She will return for labs and a followup visit with Dr. Loraine Maple in 4 months. She will contact the office  in the interim with any problems.  Plan reviewed with Dr. Beryle Beams.   Ned Card ANP/GNP-BC

## 2013-06-28 NOTE — Telephone Encounter (Signed)
Ga e pt appt for lab and MD for MAy , pt will have mammogram on 2/6 left pt a message

## 2013-06-28 NOTE — Addendum Note (Signed)
Addended by: Owens Shark on: 06/28/2013 10:58 AM   Modules accepted: Orders

## 2013-06-28 NOTE — Telephone Encounter (Signed)
Gave pt appt for lab and Md for MAy , mammogram will be scheduled as soon as another reason for mammogram is clarified

## 2013-07-05 ENCOUNTER — Ambulatory Visit
Admission: RE | Admit: 2013-07-05 | Discharge: 2013-07-05 | Disposition: A | Discharge status: Home / Self Care | Payer: PRIVATE HEALTH INSURANCE | Source: Ambulatory Visit | Attending: Nurse Practitioner | Admitting: Nurse Practitioner

## 2013-07-05 DIAGNOSIS — C50919 Malignant neoplasm of unspecified site of unspecified female breast: Secondary | ICD-10-CM

## 2013-07-05 DIAGNOSIS — Z853 Personal history of malignant neoplasm of breast: Secondary | ICD-10-CM

## 2013-07-05 DIAGNOSIS — N644 Mastodynia: Secondary | ICD-10-CM

## 2013-07-09 ENCOUNTER — Other Ambulatory Visit: Payer: Self-pay | Admitting: Internal Medicine

## 2013-07-27 ENCOUNTER — Encounter: Payer: Self-pay | Admitting: Oncology

## 2013-07-31 ENCOUNTER — Telehealth: Payer: Self-pay | Admitting: *Deleted

## 2013-07-31 ENCOUNTER — Inpatient Hospital Stay (HOSPITAL_COMMUNITY)
Admission: EM | Admit: 2013-07-31 | Discharge: 2013-08-04 | DRG: 193 | Disposition: A | Discharge status: Home Health | Payer: PRIVATE HEALTH INSURANCE | Attending: Family Medicine | Admitting: Family Medicine

## 2013-07-31 ENCOUNTER — Encounter (HOSPITAL_COMMUNITY): Payer: Self-pay | Admitting: Radiology

## 2013-07-31 ENCOUNTER — Emergency Department (HOSPITAL_COMMUNITY): Payer: PRIVATE HEALTH INSURANCE

## 2013-07-31 ENCOUNTER — Ambulatory Visit (HOSPITAL_COMMUNITY): Payer: PRIVATE HEALTH INSURANCE

## 2013-07-31 DIAGNOSIS — Z87311 Personal history of (healed) other pathological fracture: Secondary | ICD-10-CM

## 2013-07-31 DIAGNOSIS — Z87440 Personal history of urinary (tract) infections: Secondary | ICD-10-CM

## 2013-07-31 DIAGNOSIS — R32 Unspecified urinary incontinence: Secondary | ICD-10-CM | POA: Diagnosis present

## 2013-07-31 DIAGNOSIS — E785 Hyperlipidemia, unspecified: Secondary | ICD-10-CM | POA: Diagnosis present

## 2013-07-31 DIAGNOSIS — N318 Other neuromuscular dysfunction of bladder: Secondary | ICD-10-CM | POA: Diagnosis present

## 2013-07-31 DIAGNOSIS — R5383 Other fatigue: Secondary | ICD-10-CM

## 2013-07-31 DIAGNOSIS — Z853 Personal history of malignant neoplasm of breast: Secondary | ICD-10-CM

## 2013-07-31 DIAGNOSIS — C9110 Chronic lymphocytic leukemia of B-cell type not having achieved remission: Secondary | ICD-10-CM | POA: Diagnosis present

## 2013-07-31 DIAGNOSIS — J189 Pneumonia, unspecified organism: Principal | ICD-10-CM | POA: Diagnosis present

## 2013-07-31 DIAGNOSIS — K219 Gastro-esophageal reflux disease without esophagitis: Secondary | ICD-10-CM | POA: Diagnosis present

## 2013-07-31 DIAGNOSIS — D509 Iron deficiency anemia, unspecified: Secondary | ICD-10-CM | POA: Diagnosis present

## 2013-07-31 DIAGNOSIS — Z7982 Long term (current) use of aspirin: Secondary | ICD-10-CM

## 2013-07-31 DIAGNOSIS — J9600 Acute respiratory failure, unspecified whether with hypoxia or hypercapnia: Secondary | ICD-10-CM | POA: Diagnosis present

## 2013-07-31 DIAGNOSIS — F329 Major depressive disorder, single episode, unspecified: Secondary | ICD-10-CM | POA: Diagnosis present

## 2013-07-31 DIAGNOSIS — Z8249 Family history of ischemic heart disease and other diseases of the circulatory system: Secondary | ICD-10-CM

## 2013-07-31 DIAGNOSIS — G4733 Obstructive sleep apnea (adult) (pediatric): Secondary | ICD-10-CM

## 2013-07-31 DIAGNOSIS — F0390 Unspecified dementia without behavioral disturbance: Secondary | ICD-10-CM | POA: Diagnosis present

## 2013-07-31 DIAGNOSIS — C50919 Malignant neoplasm of unspecified site of unspecified female breast: Secondary | ICD-10-CM

## 2013-07-31 DIAGNOSIS — N390 Urinary tract infection, site not specified: Secondary | ICD-10-CM | POA: Diagnosis present

## 2013-07-31 DIAGNOSIS — J42 Unspecified chronic bronchitis: Secondary | ICD-10-CM | POA: Diagnosis present

## 2013-07-31 DIAGNOSIS — J984 Other disorders of lung: Secondary | ICD-10-CM | POA: Diagnosis present

## 2013-07-31 DIAGNOSIS — K5730 Diverticulosis of large intestine without perforation or abscess without bleeding: Secondary | ICD-10-CM | POA: Diagnosis present

## 2013-07-31 DIAGNOSIS — M810 Age-related osteoporosis without current pathological fracture: Secondary | ICD-10-CM | POA: Diagnosis present

## 2013-07-31 DIAGNOSIS — F3289 Other specified depressive episodes: Secondary | ICD-10-CM | POA: Diagnosis present

## 2013-07-31 DIAGNOSIS — R5381 Other malaise: Secondary | ICD-10-CM | POA: Diagnosis present

## 2013-07-31 DIAGNOSIS — H409 Unspecified glaucoma: Secondary | ICD-10-CM | POA: Diagnosis present

## 2013-07-31 DIAGNOSIS — J9601 Acute respiratory failure with hypoxia: Secondary | ICD-10-CM | POA: Diagnosis present

## 2013-07-31 LAB — COMPREHENSIVE METABOLIC PANEL
ALBUMIN: 3.4 g/dL — AB (ref 3.5–5.2)
ALT: 11 U/L (ref 0–35)
AST: 20 U/L (ref 0–37)
Alkaline Phosphatase: 77 U/L (ref 39–117)
BUN: 16 mg/dL (ref 6–23)
CALCIUM: 9.1 mg/dL (ref 8.4–10.5)
CO2: 26 mEq/L (ref 19–32)
Chloride: 95 mEq/L — ABNORMAL LOW (ref 96–112)
Creatinine, Ser: 0.54 mg/dL (ref 0.50–1.10)
GFR calc non Af Amer: 82 mL/min — ABNORMAL LOW (ref >90)
GLUCOSE: 162 mg/dL — AB (ref 70–99)
Potassium: 3.8 mEq/L (ref 3.7–5.3)
SODIUM: 139 meq/L (ref 137–147)
Total Bilirubin: 0.8 mg/dL (ref 0.3–1.2)
Total Protein: 6.8 g/dL (ref 6.0–8.3)

## 2013-07-31 LAB — CBC WITH DIFFERENTIAL/PLATELET
Basophils Absolute: 0 10*3/uL (ref 0.0–0.1)
Basophils Relative: 0 % (ref 0–1)
Eosinophils Absolute: 0 10*3/uL (ref 0.0–0.7)
Eosinophils Relative: 0 % (ref 0–5)
HCT: 38.7 % (ref 36.0–46.0)
Hemoglobin: 12.9 g/dL (ref 12.0–15.0)
Lymphocytes Relative: 37 % (ref 12–46)
Lymphs Abs: 3.7 10*3/uL (ref 0.7–4.0)
MCH: 32.1 pg (ref 26.0–34.0)
MCHC: 33.3 g/dL (ref 30.0–36.0)
MCV: 96.3 fL (ref 78.0–100.0)
Monocytes Absolute: 0.5 10*3/uL (ref 0.1–1.0)
Monocytes Relative: 5 % (ref 3–12)
Neutro Abs: 5.8 10*3/uL (ref 1.7–7.7)
Neutrophils Relative %: 58 % (ref 43–77)
Platelets: 146 10*3/uL — ABNORMAL LOW (ref 150–400)
RBC: 4.02 MIL/uL (ref 3.87–5.11)
RDW: 13.8 % (ref 11.5–15.5)
WBC: 9.9 10*3/uL (ref 4.0–10.5)

## 2013-07-31 LAB — URINE MICROSCOPIC-ADD ON

## 2013-07-31 LAB — URINALYSIS, ROUTINE W REFLEX MICROSCOPIC
Glucose, UA: NEGATIVE mg/dL
Ketones, ur: >80 mg/dL — AB
NITRITE: NEGATIVE
PH: 5.5 (ref 5.0–8.0)
Protein, ur: 100 mg/dL — AB
Specific Gravity, Urine: 1.025 (ref 1.005–1.030)
Urobilinogen, UA: 1 mg/dL (ref 0.0–1.0)

## 2013-07-31 MED ORDER — IPRATROPIUM BROMIDE 0.02 % IN SOLN: 0.5000 mg | Freq: Four times a day (QID) | RESPIRATORY_TRACT | Status: DC | PRN

## 2013-07-31 MED ORDER — CITALOPRAM HYDROBROMIDE 10 MG PO TABS
10.0000 mg | ORAL_TABLET | Freq: Every day | ORAL | Status: DC
  Administered 2013-07-31 – 2013-08-04 (×5): 10 mg via ORAL
  Filled 2013-07-31 (×5): qty 1

## 2013-07-31 MED ORDER — ASPIRIN 325 MG PO TABS
325.0000 mg | ORAL_TABLET | Freq: Every day | ORAL | Status: DC
  Administered 2013-07-31 – 2013-08-04 (×5): 325 mg via ORAL
  Filled 2013-07-31 (×4): qty 1

## 2013-07-31 MED ORDER — ONDANSETRON HCL 4 MG PO TABS: 4.0000 mg | ORAL_TABLET | Freq: Four times a day (QID) | ORAL | Status: DC | PRN

## 2013-07-31 MED ORDER — SODIUM CHLORIDE 0.9 % IJ SOLN
3.0000 mL | Freq: Two times a day (BID) | INTRAMUSCULAR | Status: DC
  Administered 2013-08-01 – 2013-08-04 (×5): 3 mL via INTRAVENOUS

## 2013-07-31 MED ORDER — LATANOPROST 0.005 % OP SOLN
1.0000 [drp] | Freq: Every day | OPHTHALMIC | Status: DC
  Administered 2013-07-31 – 2013-08-03 (×4): 1 [drp] via OPHTHALMIC
  Filled 2013-07-31: qty 2.5

## 2013-07-31 MED ORDER — SODIUM CHLORIDE 0.9 % IV SOLN
INTRAVENOUS | Status: AC
  Administered 2013-07-31: 21:00:00 via INTRAVENOUS

## 2013-07-31 MED ORDER — DEXTROSE 5 % IV SOLN
1.0000 g | INTRAVENOUS | Status: DC
  Administered 2013-08-01 – 2013-08-02 (×2): 1 g via INTRAVENOUS
  Filled 2013-07-31 (×3): qty 10

## 2013-07-31 MED ORDER — IOHEXOL 350 MG/ML SOLN
80.0000 mL | Freq: Once | INTRAVENOUS | Status: AC | PRN
  Administered 2013-07-31: 80 mL via INTRAVENOUS

## 2013-07-31 MED ORDER — ONDANSETRON HCL 4 MG/2ML IJ SOLN: 4.0000 mg | Freq: Four times a day (QID) | INTRAMUSCULAR | Status: DC | PRN

## 2013-07-31 MED ORDER — SODIUM CHLORIDE 0.9 % IV BOLUS (SEPSIS)
1000.0000 mL | Freq: Once | INTRAVENOUS | Status: AC
  Administered 2013-07-31: 1000 mL via INTRAVENOUS

## 2013-07-31 MED ORDER — TIMOLOL MALEATE 0.5 % OP SOLN
1.0000 [drp] | Freq: Two times a day (BID) | OPHTHALMIC | Status: DC
  Administered 2013-07-31 – 2013-08-04 (×8): 1 [drp] via OPHTHALMIC
  Filled 2013-07-31: qty 5

## 2013-07-31 MED ORDER — BRINZOLAMIDE 1 % OP SUSP
1.0000 [drp] | Freq: Three times a day (TID) | OPHTHALMIC | Status: DC
  Administered 2013-07-31 – 2013-08-04 (×11): 1 [drp] via OPHTHALMIC
  Filled 2013-07-31: qty 10

## 2013-07-31 MED ORDER — DEXTROSE 5 % IV SOLN
1.0000 g | Freq: Once | INTRAVENOUS | Status: AC
  Administered 2013-07-31: 1 g via INTRAVENOUS
  Filled 2013-07-31: qty 10

## 2013-07-31 MED ORDER — LEVALBUTEROL HCL 0.63 MG/3ML IN NEBU: 0.6300 mg | INHALATION_SOLUTION | Freq: Four times a day (QID) | RESPIRATORY_TRACT | Status: DC | PRN

## 2013-07-31 MED ORDER — ACETAMINOPHEN 650 MG RE SUPP: 650.0000 mg | Freq: Four times a day (QID) | RECTAL | Status: DC | PRN

## 2013-07-31 MED ORDER — BRIMONIDINE TARTRATE 0.2 % OP SOLN
1.0000 [drp] | Freq: Two times a day (BID) | OPHTHALMIC | Status: DC
  Administered 2013-07-31 – 2013-08-04 (×8): 1 [drp] via OPHTHALMIC
  Filled 2013-07-31: qty 5

## 2013-07-31 MED ORDER — GUAIFENESIN-DM 100-10 MG/5ML PO SYRP
5.0000 mL | ORAL_SOLUTION | ORAL | Status: DC | PRN
  Administered 2013-07-31 – 2013-08-01 (×2): 5 mL via ORAL
  Filled 2013-07-31 (×2): qty 10

## 2013-07-31 MED ORDER — DEXTROSE 5 % IV SOLN
500.0000 mg | INTRAVENOUS | Status: DC
  Administered 2013-07-31 – 2013-08-02 (×3): 500 mg via INTRAVENOUS
  Filled 2013-07-31 (×4): qty 500

## 2013-07-31 MED ORDER — DEXTROSE 5 % IV SOLN: 500.0000 mg | Freq: Once | INTRAVENOUS | Status: DC

## 2013-07-31 MED ORDER — VITAMIN C 500 MG PO TABS
500.0000 mg | ORAL_TABLET | Freq: Every day | ORAL | Status: DC
  Administered 2013-07-31 – 2013-08-04 (×5): 500 mg via ORAL
  Filled 2013-07-31 (×5): qty 1

## 2013-07-31 MED ORDER — CALCIUM CARBONATE-VITAMIN D 500-200 MG-UNIT PO TABS
1.0000 | ORAL_TABLET | Freq: Every day | ORAL | Status: DC
  Administered 2013-07-31 – 2013-08-04 (×5): 1 via ORAL
  Filled 2013-07-31 (×5): qty 1

## 2013-07-31 MED ORDER — ACETAMINOPHEN 325 MG PO TABS
650.0000 mg | ORAL_TABLET | Freq: Four times a day (QID) | ORAL | Status: DC | PRN
  Administered 2013-08-01 – 2013-08-02 (×2): 650 mg via ORAL
  Filled 2013-07-31 (×2): qty 2

## 2013-07-31 MED ORDER — PANTOPRAZOLE SODIUM 40 MG PO TBEC
40.0000 mg | DELAYED_RELEASE_TABLET | Freq: Every day | ORAL | Status: DC
  Administered 2013-08-01 – 2013-08-04 (×4): 40 mg via ORAL
  Filled 2013-07-31 (×4): qty 1

## 2013-07-31 MED ORDER — ONDANSETRON HCL 4 MG/2ML IJ SOLN
4.0000 mg | Freq: Once | INTRAMUSCULAR | Status: DC
  Filled 2013-07-31: qty 2

## 2013-07-31 MED ORDER — BRIMONIDINE TARTRATE-TIMOLOL 0.2-0.5 % OP SOLN: 1.0000 [drp] | Freq: Two times a day (BID) | OPHTHALMIC | Status: DC

## 2013-07-31 MED ORDER — FERROUS SULFATE 325 (65 FE) MG PO TABS
325.0000 mg | ORAL_TABLET | Freq: Every day | ORAL | Status: DC
  Administered 2013-08-01 – 2013-08-04 (×4): 325 mg via ORAL
  Filled 2013-07-31 (×5): qty 1

## 2013-07-31 MED ORDER — MORPHINE SULFATE 4 MG/ML IJ SOLN
4.0000 mg | Freq: Once | INTRAMUSCULAR | Status: DC
  Filled 2013-07-31: qty 1

## 2013-07-31 MED ORDER — DEXTROSE 5 % IV SOLN: 1.0000 g | INTRAVENOUS | Status: DC

## 2013-07-31 MED ORDER — ADULT MULTIVITAMIN W/MINERALS CH
1.0000 | ORAL_TABLET | Freq: Every day | ORAL | Status: DC
  Administered 2013-08-01 – 2013-08-04 (×4): 1 via ORAL
  Filled 2013-07-31 (×5): qty 1

## 2013-07-31 NOTE — ED Provider Notes (Signed)
Care transferred to me. The CT of the chest shows no pulmonary embolism. There is infiltrate versus mass versus scarring in the right lower lung field. As patient has had some cough with shortness of breath and hypoxia we'll treat for community acquired pneumonia (last admission 2 years ago) and admit to the hospitalist.  Ephraim Hamburger, MD 07/31/13 [phone]

## 2013-07-31 NOTE — Telephone Encounter (Signed)
Granddtr. Evelena Peat called to say her grandmother was acting strange and she thought she possibly had a UTI.  She is on the way to the ED and they are hoping Dr.Granfortuna will take over her care.  Called her back, Zaelynn is in the ED.  She is getting fluids and has had a CXR.  Evelena Peat states "she has perked up."  Let her know that Dr. Beryle Beams would follow her if this was oncology related, but her PCP does see patients in the hospital if needed.  She appreciated the call back.

## 2013-07-31 NOTE — ED Notes (Addendum)
Per ems pt from home. Pt c/o generalized weakness started yesterday, progressing into today. Alert and oriented x4. Hx of dementia. Cough/ congestion x1 week. Pt usually able to ambulate on her own, unable to ambulate today. Pt urinated and defacated on herself today. Family reports last night pt had s/s like this pt had UTI or PNA. Non productive cough, denies SOB. Lung sounds clear. ekg showed afib, unsure hx of.

## 2013-07-31 NOTE — H&P (Signed)
[facility] History and Physical  Audrey Spencer PJK:[id number] DOB: 1922/11/12 DOA: 07/31/2013  Referring physician: ER physician PCP: [name], MD   Chief Complaint: shortness of breath   HPI:  80 year old female with past medical history of dementia, CLL, anemia, depression who presented to [facility] ED 07/31/2013 with worsening shortness of breath and productive cough ongoing for past week prior to this admission. Shortness of breath is worse with exertion and additionally pt was unable to get out of bed. She has experience more weakness and fatigue. No reports of fever or chills. No chest pain. No palpitations. No abdominal pain and no nausea or vomiting.  In ED, vital are as follows: BP 138/70; HR 98, Tmax 100.4 F and oxygen saturation 89% on 2 L [place] oxygen support.  Blood work was essentially unremarkable. CXR did not show acute cardiopulmonary findings but CT angio chest showed ncreased prominence of lymph nodes within the base the neck as well as development of adenopathy within the mediastinum when compared to prior study. Considering her history of lymphoma recurrence is of diagnostic concern.  Also ill-defined area of increased density within the right lung base slightly more prominent when compared to previous study differential considerations include infiltrate versus atelectasis versus a area of recurrent or active neoplastic disease. Pt was started on empiric abx coverage for possible pneumonia.   Assessment and Plan:  Principal Problem:   Acute respiratory failure with hypoxia - likely secondary to pneumonia. PE ruled out with CT angio chest - may continue nebulizer treatment as needed and scheduled - order set for pneumonia in place. Follow up blood culture results, legionella, influenza, strep pneumoniae -continue azithromycin and rocephin for CAP - oxygen support via nasal canula to keep O2 saturation above 90% Active Problems:   PNA (pneumonia) - management as above with  abx, oxygen support via [place] Right lung base density - concern for neoplastic disease; please call oncology in am for input on manageement   ANEMIA-IRON DEFICIENCY - continue ferrous sulfate supplements   GERD - continue PPI therapy   Dementia - stable  Radiological Exams on Admission: Dg Chest 2 View 07/31/2013  IMPRESSION: No acute disease.     Ct Angio Chest Pe W/cm &/or Wo Cm 07/31/2013    IMPRSSION: 1. No CT evidence of pulmonary arterial embolic disease. 2. There has been increased prominence of lymph nodes within the base the neck as well as development of adenopathy within the mediastinum when compared to prior study. The patient has a history of lymphoma and recurrence is of diagnostic concern. Further evaluation with oncology consultation if not already obtained is recommended. These results will be called to the ordering clinician or representative by the Radiologist Assistant, and communication documented in the PACS Dashboard. 3. Ill-defined area of increased density within the right lung base slightly more prominent when compared to previous study differential considerations include infiltrate versus atelectasis versus a area of recurrent or active neoplastic disease. 4. There are also increased, slightly, interstitial fibrotic changes within the lung bases.     Code Status: Full Family Communication: Pt at bedside Disposition Plan: Admit for further evaluation  Audrey Lenz, MD  [facility] Pager [phone]  Review of Systems:  Constitutional: Negative for fever, chills and malaise/fatigue. Negative for diaphoresis.  HENT: Negative for hearing loss, ear pain, nosebleeds, congestion, sore throat, neck pain, tinnitus and ear discharge.   Eyes: Negative for blurred vision, double vision, photophobia, pain, discharge and redness.  Respiratory: per HPI  Cardiovascular: Negative for chest pain, palpitations, orthopnea, claudication and leg swelling.  Gastrointestinal: Negative  for nausea, vomiting and abdominal pain. Negative for heartburn, constipation, blood in stool and melena.  Genitourinary: Negative for dysuria, urgency, frequency, hematuria and flank pain.  Musculoskeletal: Negative for myalgias, back pain, joint pain and falls.  Skin: Negative for itching and rash.  Neurological: Negative for dizziness and weakness. Negative for tingling, tremors, sensory change, speech change, focal weakness, loss of consciousness and headaches.  Endo/Heme/Allergies: Negative for environmental allergies and polydipsia. Does not bruise/bleed easily.  Psychiatric/Behavioral: Negative for suicidal ideas. The patient is not nervous/anxious.      Past Medical History  Diagnosis Date  . Dementia 01/25/2011  . CLL 09/22/2009  . Personal history of malignant neoplasm of breast 09/22/2009  . ANEMIA-IRON DEFICIENCY 04/09/2007  . Bronchiectasis without acute exacerbation 01/07/2009  . GERD 02/26/2008  . HYPERLIPIDEMIA 04/09/2007  . OBSTRUCTIVE SLEEP APNEA 04/22/2008  . Pancytopenia 03/19/2009  . Recurrent UTI 01/25/2011  . DIVERTICULOSIS, COLON 04/15/2010  . GLAUCOMA 04/09/2007  . 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 OSTEOPOROSIS 03/19/2009  . RECTOCELE WITHOUT MENTION OF UTERINE PROLAPSE 04/15/2010  . Depression 05/03/2012  . Stage I breast cancer 06/29/2012    Left breast stage 1 dx 08/1996;  ER positive Rx lumpectomy/RT/Tamoxifen   Past Surgical History  Procedure Laterality Date  . Breast surgery      lumpectomy  . Abdominal hysterectomy    . Rotater cuff     Social History:  reports that she has never smoked. She does not have any smokeless tobacco history on file. She reports that she does not drink alcohol or use illicit drugs.  Allergies  Allergen Reactions  . Pseudoephedrine Other (See Comments)    insomnia  . Codeine     REACTION: nausea (mild)  . Doxycycline     REACTION: Nausea  . Nsaids     REACTION: GI bleed/anemia  . Solifenacin Succinate      REACTION: blurred vision    Family History  Problem Relation Age of Onset  . Hypertension Father   . Cancer Sister   . Cancer Brother      Prior to Admission medications   Medication Sig Start Date End Date Taking? Authorizing Provider  aspirin 325 MG tablet Take 325 mg by mouth daily.   Yes Historical Provider, MD  AZOPT 1 % ophthalmic suspension Place 1 drop into the right eye Twice daily. 05/10/12  Yes Historical Provider, MD  Calcium Carbonate-Vitamin D (CALCIUM 600+D) 600-400 MG-UNIT per tablet Take 1 tablet by mouth daily.   Yes Historical Provider, MD  citalopram (CELEXA) 10 MG tablet Take 10 mg by mouth daily.   Yes Historical Provider, MD  COMBIGAN 0.2-0.5 % ophthalmic solution Place 1 drop into both eyes 2 (two) times daily.  06/06/13  Yes Historical Provider, MD  ferrous sulfate 325 (65 FE) MG tablet Take 325 mg by mouth daily with breakfast.   Yes Historical Provider, MD  latanoprost (XALATAN) 0.005 % ophthalmic solution Place into the left eye at bedtime. 12/24/12  Yes Historical Provider, MD  Multiple Vitamin (MULTIVITAMIN WITH MINERALS) TABS tablet Take 1 tablet by mouth daily.   Yes Historical Provider, MD  omeprazole (PRILOSEC) 20 MG capsule Take 20 mg by mouth daily.   Yes Historical Provider, MD  vitamin C (ASCORBIC ACID) 500 MG tablet Take 500 mg by mouth daily.   Yes Historical Provider, MD   Physical Exam: Filed Vitals:   07/31/13 1214 07/31/13 1250 07/31/13  1530 07/31/13 1701  BP:    147/65  Pulse:    100  Temp:  100.4 F (38 C) 98 F (36.7 C) 98.1 F (36.7 C)  TempSrc:  Rectal  Oral  Resp:    23  SpO2: 94%   92%    Physical Exam  Constitutional: Appears well-developed and well-nourished. No distress.  HENT: Normocephalic. No tonsillar erythema or exudates  Eyes: Conjunctivae and EOM are normal. PERRLA, no scleral icterus.  Neck: Normal ROM. Neck supple. No JVD. No tracheal deviation.  CVS: irregular rhythm, rate controlled, S1/S2 appreciated Pulmonary:  rhonchi at bases, no wheezing Abdominal: Soft. BS +,  no distension, tenderness, rebound or guarding.  Musculoskeletal: Normal range of motion. No edema and no tenderness.  Lymphadenopathy: No lymphadenopathy noted, cervical, inguinal. Neuro: Alert. No focal neurologic deficits Skin: Skin is warm and dry.  Psychiatric: Normal mood and affect.  Labs on Admission:  Basic Metabolic Panel:  Recent Labs Lab 07/31/13 1230  NA 139  K 3.8  CL 95*  CO2 26  GLUCOSE 162*  BUN 16  CREATININE 0.54  CALCIUM 9.1   Liver Function Tests:  Recent Labs Lab 07/31/13 1230  AST 20  ALT 11  ALKPHOS 77  BILITOT 0.8  PROT 6.8  ALBUMIN 3.4*   No results found for this basename: LIPASE, AMYLASE,  in the last 168 hours No results found for this basename: AMMONIA,  in the last 168 hours CBC:  Recent Labs Lab 07/31/13 1230  WBC 9.9  NEUTROABS 5.8  HGB 12.9  HCT 38.7  MCV 96.3  PLT 146*   Cardiac Enzymes: No results found for this basename: CKTOTAL, CKMB, CKMBINDEX, TROPONINI,  in the last 168 hours BNP: No components found with this basename: POCBNP,  CBG: No results found for this basename: GLUCAP,  in the last 168 hours  If 7PM-7AM, please contact night-coverage [email or web address] Password [facility] 07/31/2013, 5:51 PM

## 2013-07-31 NOTE — ED Provider Notes (Signed)
CSN: [id number]     Arrival date & time 07/31/13  1211 History   First MD Initiated Contact with Patient 07/31/13 1216     No chief complaint on file.    (Consider location/radiation/quality/duration/timing/severity/associated sxs/prior Treatment) Patient is a 80 y.o. female presenting with weakness. The history is provided by the patient and the EMS personnel. The history is limited by the condition of the patient (pt with dementia and can answer yes/no questions but unable to answer other hx questions).  Weakness This is a new problem. The current episode started 2 days ago. The problem occurs constantly. The problem has been gradually worsening. Associated symptoms comments: Family reported productive cough for 1 week.  Severely weak today and unable to get out of bed.  Also defecated and urinated on herself today.  Pt has no c/o of pain but did admit to mild SOB.  Pt found to have 89% O2 sat on RA by EMS and was placed on O2. The symptoms are aggravated by exertion. The symptoms are relieved by rest. She has tried nothing for the symptoms. The treatment provided no relief.    Past Medical History  Diagnosis Date  . Dementia 01/25/2011  . CLL 09/22/2009  . Personal history of malignant neoplasm of breast 09/22/2009  . ANEMIA-IRON DEFICIENCY 04/09/2007  . Bronchiectasis without acute exacerbation 01/07/2009  . GERD 02/26/2008  . HYPERLIPIDEMIA 04/09/2007  . OBSTRUCTIVE SLEEP APNEA 04/22/2008  . Pancytopenia 03/19/2009  . Recurrent UTI 01/25/2011  . DIVERTICULOSIS, COLON 04/15/2010  . GLAUCOMA 04/09/2007  . 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 OSTEOPOROSIS 03/19/2009  . RECTOCELE WITHOUT MENTION OF UTERINE PROLAPSE 04/15/2010  . Depression 05/03/2012  . Stage I breast cancer 06/29/2012    Left breast stage 1 dx 08/1996;  ER positive Rx lumpectomy/RT/Tamoxifen   Past Surgical History  Procedure Laterality Date  . Breast surgery      lumpectomy  . Abdominal hysterectomy    . Rotater  cuff     Family History  Problem Relation Age of Onset  . Hypertension Father   . Cancer Sister   . Cancer Brother    History  Substance Use Topics  . Smoking status: Never Smoker   . Smokeless tobacco: Not on file  . Alcohol Use: No   OB History   Grav Para Term Preterm Abortions TAB SAB Ect Mult Living                 Review of Systems  Unable to perform ROS Neurological: Positive for weakness.      Allergies  Pseudoephedrine; Codeine; Doxycycline; Nsaids; and Solifenacin succinate  Home Medications   Current Outpatient Rx  Name  Route  Sig  Dispense  Refill  . aspirin 325 MG tablet   Oral   Take 325 mg by mouth daily.         . AZOPT 1 % ophthalmic suspension   Right Eye   Place 1 drop into the right eye Twice daily.         . Calcium Carbonate-Vitamin D (CALCIUM 600+D) 600-400 MG-UNIT per tablet   Oral   Take 1 tablet by mouth daily.         . citalopram (CELEXA) 10 MG tablet   Oral   Take 10 mg by mouth daily.         . COMBIGAN 0.2-0.5 % ophthalmic solution   Both Eyes   Place 1 drop into both eyes 2 (two) times daily.          Marland Kitchen  ferrous sulfate 325 (65 FE) MG tablet   Oral   Take 325 mg by mouth daily with breakfast.         . latanoprost (XALATAN) 0.005 % ophthalmic solution   Left Eye   Place into the left eye at bedtime.         . Multiple Vitamin (MULTIVITAMIN WITH MINERALS) TABS tablet   Oral   Take 1 tablet by mouth daily.         Marland Kitchen omeprazole (PRILOSEC) 20 MG capsule   Oral   Take 20 mg by mouth daily.         . vitamin C (ASCORBIC ACID) 500 MG tablet   Oral   Take 500 mg by mouth daily.          BP 138/70  Pulse 98  Temp(Src) 100.4 F (38 C) (Rectal)  Resp 18  SpO2 94% Physical Exam  Nursing note and vitals reviewed. Constitutional: She appears well-developed and well-nourished. No distress.  HENT:  Head: Normocephalic and atraumatic.  Mouth/Throat: Oropharynx is clear and moist.  Eyes:  Conjunctivae and EOM are normal. Pupils are equal, round, and reactive to light.  Neck: Normal range of motion. Neck supple.  Cardiovascular: Normal rate, regular rhythm and intact distal pulses.   No murmur heard. Pulmonary/Chest: Effort normal. No respiratory distress. She has no wheezes. She has rhonchi in the right lower field and the left lower field. She has no rales.  Abdominal: Soft. She exhibits no distension. There is no tenderness. There is no rebound and no guarding.  Musculoskeletal: Normal range of motion. She exhibits no edema and no tenderness.  Neurological: She is alert.  Oriented to person and place  Skin: Skin is warm and dry. No rash noted. No erythema.  Psychiatric: She has a normal mood and affect. Her behavior is normal.    ED Course  Procedures (including critical care time) Labs Review Labs Reviewed  CBC WITH DIFFERENTIAL - Abnormal; Notable for the following:    Platelets 146 (*)    All other components within normal limits  COMPREHENSIVE METABOLIC PANEL - Abnormal; Notable for the following:    Chloride 95 (*)    Glucose, Bld 162 (*)    Albumin 3.4 (*)    GFR calc non Af Amer 82 (*)    All other components within normal limits  URINALYSIS, ROUTINE W REFLEX MICROSCOPIC - Abnormal; Notable for the following:    Color, Urine AMBER (*)    Hgb urine dipstick SMALL (*)    Bilirubin Urine MODERATE (*)    Ketones, ur >80 (*)    Protein, ur 100 (*)    Leukocytes, UA TRACE (*)    All other components within normal limits  URINE MICROSCOPIC-ADD ON - Abnormal; Notable for the following:    Bacteria, UA MANY (*)    Casts HYALINE CASTS (*)    All other components within normal limits   Imaging Review Dg Chest 2 View  07/31/2013   CLINICAL DATA:  Weakness. History of breast cancer and chronic lymphocytic leukemia.  EXAM: CHEST  2 VIEW  COMPARISON:  CT chest 07/28/2010.  PA and lateral chest 03/17/2010.  FINDINGS: The lungs are clear. Heart size is mildly enlarged.  No pneumothorax or pleural effusion. Postoperative change of lower thoracic and upper lumbar vertebroplasty noted.  IMPRESSION: No acute disease.   Electronically Signed   By: Drusilla Kanner M.D.   On: 07/31/2013 13:31     EKG Interpretation  Date/Time:  Wednesday July 31 2013 12:30:38 EST Ventricular Rate:  97 PR Interval:  147 QRS Duration: 95 QT Interval:  413 QTC Calculation: 525 R Axis:   63 Text Interpretation:  Sinus tachycardia Atrial premature complexes  Borderline repolarization abnormality Prolonged QT interval Baseline  wander in lead(s) II III aVF No significant change since last tracing  Confirmed by Maryan Rued  MD, Loree Fee ([id number]) on 07/31/2013 1:13:44 PM      MDM   Final diagnoses:  None   Patient presenting from home with a complaint of nonproductive cough for one week and progressive weakness over the last 2 days to where she was unable to get out of bed today. Patient is normally able to walk without assistance. His family had reported that patient urinated and defecated on herself today which is very unusual.  Patient currently is a very pleasant elderly lady with no complaints. She has no abdominal pain and on oxygen satting in the mid 90s.  Concern for respiratory pathology versus UTI. Patient has no signs of fluid overload concerning for CHF at this time. CBC, CMP, UA, chest x-ray pending. EKG is unchanged from prior.  3:06 PM CBC, CMP wnl.  UA with possible early infection however not convincing and culture sent.  CXR without signs of PNA however pt persistent wet cough, tachy and hypoxia off O2.  Will get CTA to r/o clot.  Also pt appears to be dehydrated with >80ketones and will give IVF as not signs of fluid overload and no hx of CHF.  Blanchie Dessert, MD 07/31/13 [phone]

## 2013-07-31 NOTE — ED Notes (Signed)
Bed: WA18 Expected date:  Expected time:  Means of arrival:  Comments: ems 

## 2013-08-01 DIAGNOSIS — Z856 Personal history of leukemia: Secondary | ICD-10-CM

## 2013-08-01 DIAGNOSIS — D72829 Elevated white blood cell count, unspecified: Secondary | ICD-10-CM

## 2013-08-01 DIAGNOSIS — Z853 Personal history of malignant neoplasm of breast: Secondary | ICD-10-CM

## 2013-08-01 DIAGNOSIS — R918 Other nonspecific abnormal finding of lung field: Secondary | ICD-10-CM

## 2013-08-01 DIAGNOSIS — Z87898 Personal history of other specified conditions: Secondary | ICD-10-CM

## 2013-08-01 LAB — COMPREHENSIVE METABOLIC PANEL
ALK PHOS: 65 U/L (ref 39–117)
ALT: 17 U/L (ref 0–35)
AST: 26 U/L (ref 0–37)
Albumin: 2.9 g/dL — ABNORMAL LOW (ref 3.5–5.2)
BILIRUBIN TOTAL: 0.4 mg/dL (ref 0.3–1.2)
BUN: 20 mg/dL (ref 6–23)
CHLORIDE: 100 meq/L (ref 96–112)
CO2: 27 mEq/L (ref 19–32)
CREATININE: 0.58 mg/dL (ref 0.50–1.10)
Calcium: 8.7 mg/dL (ref 8.4–10.5)
GFR calc Af Amer: >90 mL/min (ref >90)
GFR, EST NON AFRICAN AMERICAN: 80 mL/min — AB (ref >90)
Glucose, Bld: 161 mg/dL — ABNORMAL HIGH (ref 70–99)
POTASSIUM: 3.9 meq/L (ref 3.7–5.3)
Sodium: 140 mEq/L (ref 137–147)
Total Protein: 6.1 g/dL (ref 6.0–8.3)

## 2013-08-01 LAB — CBC
HCT: 34.8 % — ABNORMAL LOW (ref 36.0–46.0)
Hemoglobin: 11.3 g/dL — ABNORMAL LOW (ref 12.0–15.0)
MCH: 31.9 pg (ref 26.0–34.0)
MCHC: 32.5 g/dL (ref 30.0–36.0)
MCV: 98.3 fL (ref 78.0–100.0)
Platelets: 148 10*3/uL — ABNORMAL LOW (ref 150–400)
RBC: 3.54 MIL/uL — ABNORMAL LOW (ref 3.87–5.11)
RDW: 13.9 % (ref 11.5–15.5)
WBC: 11.7 10*3/uL — ABNORMAL HIGH (ref 4.0–10.5)

## 2013-08-01 LAB — GLUCOSE, CAPILLARY: GLUCOSE-CAPILLARY: 137 mg/dL — AB (ref 70–99)

## 2013-08-01 LAB — INFLUENZA PANEL BY PCR (TYPE A & B)
H1N1FLUPCR: NOT DETECTED
INFLAPCR: NEGATIVE
INFLBPCR: NEGATIVE

## 2013-08-01 NOTE — Progress Notes (Signed)
[facility] PROGRESS NOTE  Audrey Spencer BJY:[id number] DOB: July 18, 1922 DOA: 07/31/2013 PCP: [name], MD  Assessment/Plan Acute respiratory failure with hypoxia - likely secondary to PNA. PE ruled out with CT angio - Continue nebs: xopenx and atrovent - Blood cx, legionella, and strep pneumoniae cx pending - Influenza- negative  - Droplet isolation - Continue azithromycin and rocephin for CAP - Oxygen as needed to maintain sat above 90%, currently on RA  Pneumonia - abx as above - nebs, robitussin - oxygen as needed  Leukocytosis - monitor, likely related to PNA - abx as above - afebrile  Right lung base density  - concern for neoplastic disease - History of CLL - Oncology consulted  Anemia-Iron deficiency -continue ferrous sulfate supplements  GERD - continue home PPI  Dementia -Stable  Code Status: FULL Family Communication: daughter at bedside updated on plan Disposition Plan: pending respiratory improvement   Consultants:  Oncology  Procedures:  CT angio chest  Antibiotics:  Azithromycin (started on 3/4)- day 2  Rocephin (started on 3/4)- day 2  HPI/Subjective: Pt has no new complaints. Breathing slightly improved with intermittent coughing.  Objective: Filed Vitals:   08/01/13 1443  BP: 99/53  Pulse: 86  Temp: 97.7 F (36.5 C)  Resp: 20    Intake/Output Summary (Last 24 hours) at 08/01/13 1451 Last data filed at 08/01/13 1445  Gross per 24 hour  Intake   1895 ml  Output    100 ml  Net   1795 ml   Filed Weights   07/31/13 1835  Weight: 70.1 kg (154 lb 8.7 oz)    Exam:   General:  No acute distress, appears well-developed and nourished   Cardiovascular: irregularly irregular, no murmurs or rubs  Respiratory: No wheezes, rhonchi and diminished at bilateral bases  Abdomen: Soft. Bowel sounds present. No distension or tenderness  Musculoskeletal: No edema, cyanosis, or clubbing noted  Data Reviewed: Basic  Metabolic Panel:  Recent Labs Lab 07/31/13 1230 08/01/13 0340  NA 139 140  K 3.8 3.9  CL 95* 100  CO2 26 27  GLUCOSE 162* 161*  BUN 16 20  CREATININE 0.54 0.58  CALCIUM 9.1 8.7   Liver Function Tests:  Recent Labs Lab 07/31/13 1230 08/01/13 0340  AST 20 26  ALT 11 17  ALKPHOS 77 65  BILITOT 0.8 0.4  PROT 6.8 6.1  ALBUMIN 3.4* 2.9*   No results found for this basename: LIPASE, AMYLASE,  in the last 168 hours No results found for this basename: AMMONIA,  in the last 168 hours CBC:  Recent Labs Lab 07/31/13 1230 08/01/13 0340  WBC 9.9 11.7*  NEUTROABS 5.8  --   HGB 12.9 11.3*  HCT 38.7 34.8*  MCV 96.3 98.3  PLT 146* 148*   Cardiac Enzymes: No results found for this basename: CKTOTAL, CKMB, CKMBINDEX, TROPONINI,  in the last 168 hours BNP (last 3 results) No results found for this basename: PROBNP,  in the last 8760 hours CBG:  Recent Labs Lab 08/01/13 0844  GLUCAP 137*    No results found for this or any previous visit (from the past 240 hour(s)).   Studies: Dg Chest 2 View  07/31/2013   CLINICAL DATA:  Weakness. History of breast cancer and chronic lymphocytic leukemia.  EXAM: CHEST  2 VIEW  COMPARISON:  CT chest 07/28/2010.  PA and lateral chest 03/17/2010.  FINDINGS: The lungs are clear. Heart size is mildly enlarged. No pneumothorax or pleural effusion. Postoperative change of lower thoracic  and upper lumbar vertebroplasty noted.  IMPRESSION: No acute disease.   Electronically Signed   By: Inge Rise M.D.   On: 07/31/2013 13:31   Ct Angio Chest Pe W/cm &/or Wo Cm  07/31/2013   CLINICAL DATA:  Tachycardia and hypoxia  EXAM: CT ANGIOGRAPHY CHEST WITH CONTRAST  TECHNIQUE: Multidetector CT imaging of the chest was performed using the standard protocol during bolus administration of intravenous contrast. Multiplanar CT image reconstructions and MIPs were obtained to evaluate the vascular anatomy.  CONTRAST:  70mL OMNIPAQUE IOHEXOL 350 MG/ML SOLN   COMPARISON:  DG CHEST 2 VIEW dated 07/31/2013; CT CHEST W/CM dated 06/25/2009  FINDINGS: The thoracic inlet demonstrates and prominent lymph nodes within the base of the neck the largest is in and infraclavicular region on the right image number 8 series 4 measuring 9 mm in short axis.  There is no evidence of mediastinal masses. A 1.4 cm precarinal lymph node is identified image 35 series 4. An enlarged subcarinal lymph node is appreciated image 41 series 4 measuring 1.5 cm. An enlarged lymph node is also identified just inferior and medial to the bronchus of the right lower lobe.  There is no evidence of filling defects within the main, lobar, or segmental pulmonary arteries. There is no evidence of a thoracic aortic aneurysm nor dissection.  Evaluation of the lung parenchyma demonstrates nodular densities within the posterior basal segment right lower lobe best appreciated image 72 series 11. These findings are more prominent when compared to previous study dated 06/25/2009. There is increased prominence of the interstitial markings within the lung bases as well as areas of mild pleural thickening. No further focal regions of consolidation or evidence of focal infiltrates are appreciated. The central airways are patent.  Stable cysts are appreciated within the liver and right kidney. Remaining visualized upper abdominal viscera otherwise are grossly unremarkable.  There is no evidence of aggressive appearing osseous lesions. Areas of prior cement augmentation are identified within the lower thoracic and upper lumbar spine.  Review of the MIP images confirms the above findings.  IMPRESSION: 1. No CT evidence of pulmonary arterial embolic disease. 2. There has been increased prominence of lymph nodes within the base the neck as well as development of adenopathy within the mediastinum when compared to prior study. The patient has a history of lymphoma and recurrence is of diagnostic concern. Further evaluation with  oncology consultation if not already obtained is recommended. These results will be called to the ordering clinician or representative by the Radiologist Assistant, and communication documented in the PACS Dashboard. 3. Ill-defined area of increased density within the right lung base slightly more prominent when compared to previous study differential considerations include infiltrate versus atelectasis versus a area of recurrent or active neoplastic disease. 4. There are also increased, slightly, interstitial fibrotic changes within the lung bases.   Electronically Signed   By: Margaree Mackintosh M.D.   On: 07/31/2013 16:53    Scheduled Meds: . aspirin  325 mg Oral Daily  . azithromycin  500 mg Intravenous Q24H  . timolol  1 drop Both Eyes BID   And  . brimonidine  1 drop Both Eyes BID  . brinzolamide  1 drop Right Eye TID  . calcium-vitamin D  1 tablet Oral Daily  . cefTRIAXone (ROCEPHIN)  IV  1 g Intravenous Q24H  . citalopram  10 mg Oral Daily  . ferrous sulfate  325 mg Oral Q breakfast  . latanoprost  1 drop Left Eye  QHS  . multivitamin with minerals  1 tablet Oral Daily  . pantoprazole  40 mg Oral Daily  . sodium chloride  3 mL Intravenous Q12H  . vitamin C  500 mg Oral Daily   Continuous Infusions:   Principal Problem:   Acute respiratory failure with hypoxia Active Problems:   ANEMIA-IRON DEFICIENCY   GERD   Dementia   PNA (pneumonia)    Time spent: > 35 min    Audrey Spencer, [facility] Pager [phone]. If 7PM-7AM, please contact night-coverage at [email or web address], password [facility] 08/01/2013, 2:51 PM  LOS: 1 day

## 2013-08-01 NOTE — Progress Notes (Signed)
[name] is a patient of my partner Dr. Beryle Beams. Admitted with acute respiratory failure /hypoxia. Possibly due to pneumonia.   Oncologic history:   1. Stage I breast cancer 1998  2. NHL 1993  3. CLL 2010  Tonight she is doing well, sitting up and eating her dinner. She is not having any difficulty with breathing presently.    A/P:   1. Leukocytosis: observe for now  2. Right lung base density: observe and we may need to repeat a CT when patient is over this acute illness.  3. Agree with other recommendation by primary team.  I will let Dr. Beryle Beams of this patients admission and he will follow along with you.  Thank you   Marcy Panning, MD Medical/Oncology [facility] [phone] (beeper) [phone] (Office)  08/01/2013, 5:37 PM

## 2013-08-02 DIAGNOSIS — C9110 Chronic lymphocytic leukemia of B-cell type not having achieved remission: Secondary | ICD-10-CM

## 2013-08-02 DIAGNOSIS — J449 Chronic obstructive pulmonary disease, unspecified: Secondary | ICD-10-CM

## 2013-08-02 DIAGNOSIS — N390 Urinary tract infection, site not specified: Secondary | ICD-10-CM

## 2013-08-02 DIAGNOSIS — J440 Chronic obstructive pulmonary disease with acute lower respiratory infection: Secondary | ICD-10-CM

## 2013-08-02 LAB — STREP PNEUMONIAE URINARY ANTIGEN: STREP PNEUMO URINARY ANTIGEN: POSITIVE — AB

## 2013-08-02 LAB — GLUCOSE, CAPILLARY: Glucose-Capillary: 168 mg/dL — ABNORMAL HIGH (ref 70–99)

## 2013-08-02 NOTE — Progress Notes (Signed)
infection control nurse Mickel Baas ok to D/C droplet precaution. Pt is has no fever within the past 24hrs and flu PCR is negative, no RVP ordered.

## 2013-08-02 NOTE — Progress Notes (Signed)
Progress Note:  Subjective: Pleasant 80 year old woman well-known to me. Initially diagnosed with low-grade B-cell non-Hodgkin's lymphoma over 20 years ago in July 1993 treated on clinical trial with cladribine. She  achieved a durable remission. Her hematologic disorder reappeared with a different face now behaving more like chronic lymphocytic leukemia with progressive rise in peripheral white blood count and lymphocytosis in 2010. She received 5 cycles of chemotherapy with a combination of Treanda plus Rituxan between March 22 and 12/10/2008 and and and again achieved a complete response. Blood counts have been overall stable. At time of most recent office visit on 06/28/2013 white count 7100 with 47% neutrophils, 42% lymphocytes, hemoglobin 12.9, and platelet count 144,000. She is a nonsmoker but she does have a chronic bronchitis. Most recent CT scan prior to this admission was done in 2012 and showed no gross abnormalities. Current CT shows a patchy nodular left lower lobe process which is not visible on a regular chest film. The patient doesn't feel that her cough is any worse than her baseline. She denies any fever or chills. Her granddaughter called our office yesterday morning to report she was acting strange and that she might have a urinary tract infection. The patient tells me that she was weak and was not able to get out of bed yesterday morning but denies any respiratory distress, chest pain, or any other pain. She denies any abdominal pain, nausea, vomiting, or diarrhea. Initial evaluation in the emergency department yesterday:  only complaint was nonspecific weakness. She admitted to an episode of urinary and fecal incontinence. She admitted to mild dyspnea. Oxygen saturation was 89% on room air. A routine chest x-ray was unremarkable. She was afebrile. There were no localizing findings on exam except for her poor recent memory which is chronic. She is currently comfortable, in no  respiratory distress, eating breakfast.  White count on admission shows a mild leukocytosis compared with her recent baseline with white count 11,700 which is nonspecific. Differential not done. CBC on March 4 with a total white count of 9900 and showed differential count 58% neutrophils 37% lymphocytes. A urinalysis negative for nitrite, trace positive for leukocytes, 3-6 white cells, many bacteria noted.   Past history:    stage I , estrogen receptor positive breast cancer diagnosed 16 years ago.  History of upper GI bleed related to nonsteroidal anti-inflammatory drugs.  Osteoporosis with history of compression fractures of the thoracic and lumbar spine requiring kyphoplasty.  Mild dementia.  Glaucoma.   Vitals: Filed Vitals:   08/02/13 0510  BP: 151/61  Pulse: 78  Temp: 98.3 F (36.8 C)  Resp: 20   Wt Readings from Last 3 Encounters:  08/02/13 160 lb 0.9 oz (72.6 kg)  06/28/13 158 lb 3.2 oz (71.759 kg)  04/12/13 163 lb 4 oz (74.05 kg)     PHYSICAL EXAM:  General pleasant elderly woman in no distress Head: Normal Eyes: Normal Throat: No erythema or exudate Neck: Full range of motion Lymph Nodes: No cervical, supraclavicular, or axillary adenopathy Lungs: Clear to auscultation and resonant to percussion throughout. No egophony Breasts:  Cardiac: Regular rhythm no murmur gallop or rub Abdominal: Soft, nontender, no mass, no organomegaly Extremities: No edema, no calf tenderness Vascular:  No cyanosis Neurologic: she is alert and oriented, she has excellent remote memory and was able to review with me in many of the details of her diagnosis and treatments over the last 20 years. Cranial nerves grossly normal. Motor strength 5 over 5. Reflexes 1+ symmetric upper  and lower. Skin: No rash or ecchymosis  Current facility-administered medications:acetaminophen (TYLENOL) suppository 650 mg, 650 mg, Rectal, Q6H PRN, Robbie Lis, MD;  acetaminophen (TYLENOL) tablet 650  mg, 650 mg, Oral, Q6H PRN, Robbie Lis, MD, 650 mg at 08/01/13 3875;  aspirin tablet 325 mg, 325 mg, Oral, Daily, Robbie Lis, MD, 325 mg at 08/01/13 1117 azithromycin (ZITHROMAX) 500 mg in dextrose 5 % 250 mL IVPB, 500 mg, Intravenous, Q24H, Robbie Lis, MD, 500 mg at 08/01/13 1800;  brimonidine (ALPHAGAN) 0.2 % ophthalmic solution 1 drop, 1 drop, Both Eyes, BID, Robbie Lis, MD, 1 drop at 08/01/13 2112;  brinzolamide (AZOPT) 1 % ophthalmic suspension 1 drop, 1 drop, Right Eye, TID, Robbie Lis, MD, 1 drop at 08/01/13 2120 calcium-vitamin D (OSCAL WITH D) 500-200 MG-UNIT per tablet 1 tablet, 1 tablet, Oral, Daily, Robbie Lis, MD, 1 tablet at 08/01/13 1117;  cefTRIAXone (ROCEPHIN) 1 g in dextrose 5 % 50 mL IVPB, 1 g, Intravenous, Q24H, Robbie Lis, MD, 1 g at 08/01/13 1715;  citalopram (CELEXA) tablet 10 mg, 10 mg, Oral, Daily, Robbie Lis, MD, 10 mg at 08/01/13 1117 ferrous sulfate tablet 325 mg, 325 mg, Oral, Q breakfast, Robbie Lis, MD, 325 mg at 08/01/13 6433;  guaiFENesin-dextromethorphan (ROBITUSSIN DM) 100-10 MG/5ML syrup 5 mL, 5 mL, Oral, Q4H PRN, Gardiner Barefoot, NP, 5 mL at 08/01/13 0505;  ipratropium (ATROVENT) nebulizer solution 0.5 mg, 0.5 mg, Nebulization, Q6H PRN, Robbie Lis, MD latanoprost (XALATAN) 0.005 % ophthalmic solution 1 drop, 1 drop, Left Eye, QHS, Robbie Lis, MD, 1 drop at 08/01/13 2114;  levalbuterol (XOPENEX) nebulizer solution 0.63 mg, 0.63 mg, Nebulization, Q6H PRN, Robbie Lis, MD;  multivitamin with minerals tablet 1 tablet, 1 tablet, Oral, Daily, Robbie Lis, MD, 1 tablet at 08/01/13 1117;  ondansetron [facility]) injection 4 mg, 4 mg, Intravenous, Q6H PRN, Robbie Lis, MD ondansetron [facility]) tablet 4 mg, 4 mg, Oral, Q6H PRN, Robbie Lis, MD;  pantoprazole (PROTONIX) EC tablet 40 mg, 40 mg, Oral, Daily, Robbie Lis, MD, 40 mg at 08/01/13 1118;  sodium chloride 0.9 % injection 3 mL, 3 mL, Intravenous, Q12H, Robbie Lis, MD, 3 mL at  08/01/13 2115;  timolol (TIMOPTIC) 0.5 % ophthalmic solution 1 drop, 1 drop, Both Eyes, BID, Robbie Lis, MD, 1 drop at 08/01/13 2120 vitamin C (ASCORBIC ACID) tablet 500 mg, 500 mg, Oral, Daily, Robbie Lis, MD, 500 mg at 08/01/13 1118  Labs:   Recent Labs  07/31/13 1230 08/01/13 0340  WBC 9.9 11.7*  HGB 12.9 11.3*  HCT 38.7 34.8*  PLT 146* 148*    Recent Labs  07/31/13 1230 08/01/13 0340  NA 139 140  K 3.8 3.9  CL 95* 100  CO2 26 27  GLUCOSE 162* 161*  BUN 16 20  CREATININE 0.54 0.58  CALCIUM 9.1 8.7      Images Studies/Results:   Dg Chest 2 View  07/31/2013   CLINICAL DATA:  Weakness. History of breast cancer and chronic lymphocytic leukemia.  EXAM: CHEST  2 VIEW  COMPARISON:  CT chest 07/28/2010.  PA and lateral chest 03/17/2010.  FINDINGS: The lungs are clear. Heart size is mildly enlarged. No pneumothorax or pleural effusion. Postoperative change of lower thoracic and upper lumbar vertebroplasty noted.  IMPRESSION: No acute disease.   Electronically Signed   By: Inge Rise M.D.   On: 07/31/2013 13:31   Ct Angio Chest Pe  W/cm &/or Wo Cm  07/31/2013   CLINICAL DATA:  Tachycardia and hypoxia  EXAM: CT ANGIOGRAPHY CHEST WITH CONTRAST  TECHNIQUE: Multidetector CT imaging of the chest was performed using the standard protocol during bolus administration of intravenous contrast. Multiplanar CT image reconstructions and MIPs were obtained to evaluate the vascular anatomy.  CONTRAST:  29mL OMNIPAQUE IOHEXOL 350 MG/ML SOLN  COMPARISON:  DG CHEST 2 VIEW dated 07/31/2013; CT CHEST W/CM dated 06/25/2009  FINDINGS: The thoracic inlet demonstrates and prominent lymph nodes within the base of the neck the largest is in and infraclavicular region on the right image number 8 series 4 measuring 9 mm in short axis.  There is no evidence of mediastinal masses. A 1.4 cm precarinal lymph node is identified image 35 series 4. An enlarged subcarinal lymph node is appreciated image 41 series  4 measuring 1.5 cm. An enlarged lymph node is also identified just inferior and medial to the bronchus of the right lower lobe.  There is no evidence of filling defects within the main, lobar, or segmental pulmonary arteries. There is no evidence of a thoracic aortic aneurysm nor dissection.  Evaluation of the lung parenchyma demonstrates nodular densities within the posterior basal segment right lower lobe best appreciated image 72 series 11. These findings are more prominent when compared to previous study dated 06/25/2009. There is increased prominence of the interstitial markings within the lung bases as well as areas of mild pleural thickening. No further focal regions of consolidation or evidence of focal infiltrates are appreciated. The central airways are patent.  Stable cysts are appreciated within the liver and right kidney. Remaining visualized upper abdominal viscera otherwise are grossly unremarkable.  There is no evidence of aggressive appearing osseous lesions. Areas of prior cement augmentation are identified within the lower thoracic and upper lumbar spine.  Review of the MIP images confirms the above findings.  IMPRESSION: 1. No CT evidence of pulmonary arterial embolic disease. 2. There has been increased prominence of lymph nodes within the base the neck as well as development of adenopathy within the mediastinum when compared to prior study. The patient has a history of lymphoma and recurrence is of diagnostic concern. Further evaluation with oncology consultation if not already obtained is recommended. These results will be called to the ordering clinician or representative by the Radiologist Assistant, and communication documented in the PACS Dashboard. 3. Ill-defined area of increased density within the right lung base slightly more prominent when compared to previous study differential considerations include infiltrate versus atelectasis versus a area of recurrent or active neoplastic  disease. 4. There are also increased, slightly, interstitial fibrotic changes within the lung bases.   Electronically Signed   By: Margaree Mackintosh M.D.   On: 07/31/2013 16:53     Patient Active Problem List   Diagnosis Date Noted  . Dementia 01/25/2011    Priority: High  . PNA (pneumonia) 07/31/2013  . Acute respiratory failure with hypoxia 07/31/2013  . Stage I breast cancer 06/29/2012  . CLL (chronic lymphocytic leukemia) 05/03/2012  . OBSTRUCTIVE SLEEP APNEA 04/22/2008  . GERD 02/26/2008  . ANEMIA-IRON DEFICIENCY 04/09/2007    Assessment and Plan:  #1. Nonspecific weakness in an elderly woman Acute bronchitis versus chronic. She probably does have a urinary tract infection. She is afebrile. There are no focal neurologic deficits except for poor recent memory. Continue current antibiotic treatment pending culture results  #2. WD LL/CLL I am absolutely not concerned with the CT reading of more prominent lymph  nodes at the base of the neck and mediastinum. There is no indication to put this 80 year old lady with a stable CBC back on a chemotherapy program.  #3. Atypical patchy nodular infiltrate left lower lung not obvious on regular chest x-ray. My clinical inclination would be observation alone with antibiotic treatment as already initiated and not to put her through any invasive procedures.  #4. Early-stage estrogen receptor positive breast cancer likely cured now out over 16 years.    GRANFORTUNA,JAMES M 08/02/2013, 9:05 AM

## 2013-08-02 NOTE — Progress Notes (Signed)
[facility] PROGRESS NOTE  Audrey Spencer:[id number] DOB: Nov 22, 1922 DOA: 07/31/2013 PCP: [name], MD  Assessment/Plan Acute respiratory failure with hypoxia - likely secondary to PNA. PE ruled out with CT angio - Continue nebs: xopenx and atrovent - Blood cx: NTD  - Strep Pneumoniae urine antigen Positive - Influenza- negative - Continue azithromycin and rocephin for CAP - Oxygen as needed to maintain sat above 90%, currently on RA  Pneumonia - abx as above - nebs, robitussin - continue supportive therapy  Leukocytosis - monitor, likely related to PNA - abx as above - afebrile - reassess next am  Right lung base density  - Oncology on board and currently not recommending any invasive procedures for work up. Recommends treating pneumonia at this time.  Anemia-Iron deficiency -continue ferrous sulfate supplements  GERD - continue home PPI  Dementia -Stable  Code Status: FULL Family Communication: discussed with pt at bedside Disposition Plan: pending respiratory improvement   Consultants:  Oncology  Procedures:  CT angio chest  Antibiotics:  Azithromycin (started on 3/4)- day 2  Rocephin (started on 3/4)- day 2  HPI/Subjective: Pt has no new complaints. No acute issues reported overnight. Denies using supplemental oxygen at home.  Objective: Filed Vitals:   08/02/13 1349  BP: 124/89  Pulse: 76  Temp: 98.1 F (36.7 C)  Resp: 20    Intake/Output Summary (Last 24 hours) at 08/02/13 1552 Last data filed at 08/02/13 0900  Gross per 24 hour  Intake    900 ml  Output    100 ml  Net    800 ml   Filed Weights   07/31/13 1835 08/02/13 0600  Weight: 70.1 kg (154 lb 8.7 oz) 72.6 kg (160 lb 0.9 oz)    Exam:   General:  No acute distress, appears well-developed and nourished   Cardiovascular: irregularly irregular, no murmurs or rubs  Respiratory: No wheezes, rhonchi and diminished at bilateral bases  Abdomen: Soft. Bowel  sounds present. No distension or tenderness  Musculoskeletal: No edema, cyanosis, or clubbing noted  Data Reviewed: Basic Metabolic Panel:  Recent Labs Lab 07/31/13 1230 08/01/13 0340  NA 139 140  K 3.8 3.9  CL 95* 100  CO2 26 27  GLUCOSE 162* 161*  BUN 16 20  CREATININE 0.54 0.58  CALCIUM 9.1 8.7   Liver Function Tests:  Recent Labs Lab 07/31/13 1230 08/01/13 0340  AST 20 26  ALT 11 17  ALKPHOS 77 65  BILITOT 0.8 0.4  PROT 6.8 6.1  ALBUMIN 3.4* 2.9*   No results found for this basename: LIPASE, AMYLASE,  in the last 168 hours No results found for this basename: AMMONIA,  in the last 168 hours CBC:  Recent Labs Lab 07/31/13 1230 08/01/13 0340  WBC 9.9 11.7*  NEUTROABS 5.8  --   HGB 12.9 11.3*  HCT 38.7 34.8*  MCV 96.3 98.3  PLT 146* 148*   Cardiac Enzymes: No results found for this basename: CKTOTAL, CKMB, CKMBINDEX, TROPONINI,  in the last 168 hours BNP (last 3 results) No results found for this basename: PROBNP,  in the last 8760 hours CBG:  Recent Labs Lab 08/01/13 0844 08/02/13 0748  GLUCAP 137* 168*    Recent Results (from the past 240 hour(s))  CULTURE, BLOOD (ROUTINE X 2)     Status: None   Collection Time    07/31/13  5:40 PM      Result Value Ref Range Status   Specimen Description BLOOD LEFT ARM  Final   Special Requests BOTTLES DRAWN AEROBIC AND ANAEROBIC 5CC   Final   Culture  Setup Time     Final   Value: 08/01/2013 02:37     Performed at [facility]   Culture     Final   Value:        BLOOD CULTURE RECEIVED NO GROWTH TO DATE CULTURE WILL BE HELD FOR 5 DAYS BEFORE ISSUING A FINAL NEGATIVE REPORT     Performed at [facility]   Report Status PENDING   Incomplete  CULTURE, BLOOD (ROUTINE X 2)     Status: None   Collection Time    07/31/13  5:40 PM      Result Value Ref Range Status   Specimen Description BLOOD LEFT FOREARM   Final   Special Requests BOTTLES DRAWN AEROBIC AND ANAEROBIC 25ML   Final   Culture   Setup Time     Final   Value: 08/01/2013 02:36     Performed at [facility]   Culture     Final   Value:        BLOOD CULTURE RECEIVED NO GROWTH TO DATE CULTURE WILL BE HELD FOR 5 DAYS BEFORE ISSUING A FINAL NEGATIVE REPORT     Performed at [facility]   Report Status PENDING   Incomplete     Studies: Ct Angio Chest Pe W/cm &/or Wo Cm  07/31/2013   CLINICAL DATA:  Tachycardia and hypoxia  EXAM: CT ANGIOGRAPHY CHEST WITH CONTRAST  TECHNIQUE: Multidetector CT imaging of the chest was performed using the standard protocol during bolus administration of intravenous contrast. Multiplanar CT image reconstructions and MIPs were obtained to evaluate the vascular anatomy.  CONTRAST:  72mL OMNIPAQUE IOHEXOL 350 MG/ML SOLN  COMPARISON:  DG CHEST 2 VIEW dated 07/31/2013; CT CHEST W/CM dated 06/25/2009  FINDINGS: The thoracic inlet demonstrates and prominent lymph nodes within the base of the neck the largest is in and infraclavicular region on the right image number 8 series 4 measuring 9 mm in short axis.  There is no evidence of mediastinal masses. A 1.4 cm precarinal lymph node is identified image 35 series 4. An enlarged subcarinal lymph node is appreciated image 41 series 4 measuring 1.5 cm. An enlarged lymph node is also identified just inferior and medial to the bronchus of the right lower lobe.  There is no evidence of filling defects within the main, lobar, or segmental pulmonary arteries. There is no evidence of a thoracic aortic aneurysm nor dissection.  Evaluation of the lung parenchyma demonstrates nodular densities within the posterior basal segment right lower lobe best appreciated image 72 series 11. These findings are more prominent when compared to previous study dated 06/25/2009. There is increased prominence of the interstitial markings within the lung bases as well as areas of mild pleural thickening. No further focal regions of consolidation or evidence of focal infiltrates are  appreciated. The central airways are patent.  Stable cysts are appreciated within the liver and right kidney. Remaining visualized upper abdominal viscera otherwise are grossly unremarkable.  There is no evidence of aggressive appearing osseous lesions. Areas of prior cement augmentation are identified within the lower thoracic and upper lumbar spine.  Review of the MIP images confirms the above findings.  IMPRESSION: 1. No CT evidence of pulmonary arterial embolic disease. 2. There has been increased prominence of lymph nodes within the base the neck as well as development of adenopathy within the mediastinum when compared to  prior study. The patient has a history of lymphoma and recurrence is of diagnostic concern. Further evaluation with oncology consultation if not already obtained is recommended. These results will be called to the ordering clinician or representative by the Radiologist Assistant, and communication documented in the PACS Dashboard. 3. Ill-defined area of increased density within the right lung base slightly more prominent when compared to previous study differential considerations include infiltrate versus atelectasis versus a area of recurrent or active neoplastic disease. 4. There are also increased, slightly, interstitial fibrotic changes within the lung bases.   Electronically Signed   By: Margaree Mackintosh M.D.   On: 07/31/2013 16:53    Scheduled Meds: . aspirin  325 mg Oral Daily  . azithromycin  500 mg Intravenous Q24H  . timolol  1 drop Both Eyes BID   And  . brimonidine  1 drop Both Eyes BID  . brinzolamide  1 drop Right Eye TID  . calcium-vitamin D  1 tablet Oral Daily  . cefTRIAXone (ROCEPHIN)  IV  1 g Intravenous Q24H  . citalopram  10 mg Oral Daily  . ferrous sulfate  325 mg Oral Q breakfast  . latanoprost  1 drop Left Eye QHS  . multivitamin with minerals  1 tablet Oral Daily  . pantoprazole  40 mg Oral Daily  . sodium chloride  3 mL Intravenous Q12H  . vitamin C   500 mg Oral Daily   Continuous Infusions:   Principal Problem:   Acute respiratory failure with hypoxia Active Problems:   ANEMIA-IRON DEFICIENCY   GERD   Dementia   PNA (pneumonia)    Time spent: > 35 min    Nayib Remer, [facility] Pager [phone]. If 7PM-7AM, please contact night-coverage at [email or web address], password [facility] 08/02/2013, 3:52 PM  LOS: 2 days

## 2013-08-02 NOTE — Progress Notes (Signed)
Assuming care of patient, received report and agree with previously charted assessment. J.Butler Vegh, RN

## 2013-08-03 LAB — BASIC METABOLIC PANEL
BUN: 12 mg/dL (ref 6–23)
CHLORIDE: 98 meq/L (ref 96–112)
CO2: 34 mEq/L — ABNORMAL HIGH (ref 19–32)
Calcium: 8.6 mg/dL (ref 8.4–10.5)
Creatinine, Ser: 0.49 mg/dL — ABNORMAL LOW (ref 0.50–1.10)
GFR calc non Af Amer: 84 mL/min — ABNORMAL LOW (ref >90)
GLUCOSE: 127 mg/dL — AB (ref 70–99)
Potassium: 3.7 mEq/L (ref 3.7–5.3)
Sodium: 139 mEq/L (ref 137–147)

## 2013-08-03 LAB — LEGIONELLA ANTIGEN, URINE: LEGIONELLA ANTIGEN, URINE: NEGATIVE

## 2013-08-03 LAB — CBC
HEMATOCRIT: 33.6 % — AB (ref 36.0–46.0)
Hemoglobin: 10.6 g/dL — ABNORMAL LOW (ref 12.0–15.0)
MCH: 31.4 pg (ref 26.0–34.0)
MCHC: 31.5 g/dL (ref 30.0–36.0)
MCV: 99.4 fL (ref 78.0–100.0)
Platelets: 125 10*3/uL — ABNORMAL LOW (ref 150–400)
RBC: 3.38 MIL/uL — ABNORMAL LOW (ref 3.87–5.11)
RDW: 13.9 % (ref 11.5–15.5)
WBC: 8.6 10*3/uL (ref 4.0–10.5)

## 2013-08-03 LAB — URINE CULTURE
Colony Count: NO GROWTH
Culture: NO GROWTH

## 2013-08-03 LAB — GLUCOSE, CAPILLARY: Glucose-Capillary: 137 mg/dL — ABNORMAL HIGH (ref 70–99)

## 2013-08-03 MED ORDER — LEVOFLOXACIN 500 MG PO TABS
500.0000 mg | ORAL_TABLET | Freq: Every day | ORAL | Status: DC
  Administered 2013-08-03 – 2013-08-04 (×2): 500 mg via ORAL
  Filled 2013-08-03 (×2): qty 1

## 2013-08-03 MED ORDER — VITAMINS A & D EX OINT
TOPICAL_OINTMENT | CUTANEOUS | Status: AC
  Filled 2013-08-03: qty 5

## 2013-08-03 NOTE — Progress Notes (Signed)
Grand daughter, Janett Billow prefers pt to go home with home health vs SNF. States she will stay with her.

## 2013-08-03 NOTE — Progress Notes (Signed)
[facility] PROGRESS NOTE  Audrey Spencer NLZ:[id number] DOB: 11-Feb-1923 DOA: 07/31/2013 PCP: Oliver Barre, MD  Assessment/Plan Acute respiratory failure with hypoxia - likely secondary to PNA. PE ruled out with CT angio - Continue nebs: xopenx and atrovent - Blood cx: NTD  - Strep Pneumoniae urine antigen Positive - Influenza- negative - Azithromycin and rocephin for CAP- D/c'd on 3/7  - Changed to oral Levaquin for CAP on 3/7 - Oxygen as needed to maintain sat above 90%, currently on RA  Pneumonia - abx as above - nebs, robitussin - continue supportive therapy  Leukocytosis - Resolved, likely related to PNA - abx as above - afebrile - reassess next am  Right lung base density  - Oncology on board and currently not recommending any invasive procedures for work up. Recommends treating pneumonia at this time.  Anemia-Iron deficiency -continue ferrous sulfate supplements  GERD - continue home PPI  Dementia -Stable  DVT prevention: SCDs and Asprin  Code Status: FULL Family Communication: discussed with pt at bedside Disposition Plan: pending continued respiratory improvement with change to oral ABX.   Consultants:  Oncology  PT- Recommendations for SNF  Procedures:  CT angio chest- increased prominence of lymph nodes within the  base the neck as well as development of adenopathy within the  mediastinum when compared to prior study  Antibiotics:  Azithromycin (started on 3/4)- day 4/4  Rocephin (started on 3/4)- day 4/4  Levaquin (stared 3/7)- day 1  HPI/Subjective: Pt has no new complaints. No acute issues reported overnight. Denies shortness of breath.  Objective: Filed Vitals:   08/03/13 0431  BP: 148/64  Pulse: 93  Temp: 98 F (36.7 C)  Resp: 18    Intake/Output Summary (Last 24 hours) at 08/03/13 1429 Last data filed at 08/02/13 2057  Gross per 24 hour  Intake    540 ml  Output    100 ml  Net    440 ml   Filed Weights    07/31/13 1835 08/02/13 0600 08/03/13 0431  Weight: 70.1 kg (154 lb 8.7 oz) 72.6 kg (160 lb 0.9 oz) 74 kg (163 lb 2.3 oz)    Exam:   General:  No acute distress, appears well-developed and nourished. Alert   Cardiovascular: irregularly irregular, no murmurs or rubs  Respiratory: Rhonchi and diminished in bilateral bases No wheezes.  Abdomen: Soft. Bowel sounds present. No distension or tenderness  Musculoskeletal: No edema, cyanosis, or clubbing noted  Data Reviewed: Basic Metabolic Panel:  Recent Labs Lab 07/31/13 1230 08/01/13 0340 08/03/13 0350  NA 139 140 139  K 3.8 3.9 3.7  CL 95* 100 98  CO2 26 27 34*  GLUCOSE 162* 161* 127*  BUN 16 20 12   CREATININE 0.54 0.58 0.49*  CALCIUM 9.1 8.7 8.6   Liver Function Tests:  Recent Labs Lab 07/31/13 1230 08/01/13 0340  AST 20 26  ALT 11 17  ALKPHOS 77 65  BILITOT 0.8 0.4  PROT 6.8 6.1  ALBUMIN 3.4* 2.9*   No results found for this basename: LIPASE, AMYLASE,  in the last 168 hours No results found for this basename: AMMONIA,  in the last 168 hours CBC:  Recent Labs Lab 07/31/13 1230 08/01/13 0340 08/03/13 0350  WBC 9.9 11.7* 8.6  NEUTROABS 5.8  --   --   HGB 12.9 11.3* 10.6*  HCT 38.7 34.8* 33.6*  MCV 96.3 98.3 99.4  PLT 146* 148* 125*   Cardiac Enzymes: No results found for this basename: CKTOTAL, CKMB, CKMBINDEX, TROPONINI,  in the last 168 hours BNP (last 3 results) No results found for this basename: PROBNP,  in the last 8760 hours CBG:  Recent Labs Lab 08/01/13 0844 08/02/13 0748 08/03/13 0805  GLUCAP 137* 168* 137*    Recent Results (from the past 240 hour(s))  CULTURE, BLOOD (ROUTINE X 2)     Status: None   Collection Time    07/31/13  5:40 PM      Result Value Ref Range Status   Specimen Description BLOOD LEFT ARM   Final   Special Requests BOTTLES DRAWN AEROBIC AND ANAEROBIC 5CC   Final   Culture  Setup Time     Final   Value: 08/01/2013 02:37     Performed at [facility]    Culture     Final   Value:        BLOOD CULTURE RECEIVED NO GROWTH TO DATE CULTURE WILL BE HELD FOR 5 DAYS BEFORE ISSUING A FINAL NEGATIVE REPORT     Performed at [facility]   Report Status PENDING   Incomplete  CULTURE, BLOOD (ROUTINE X 2)     Status: None   Collection Time    07/31/13  5:40 PM      Result Value Ref Range Status   Specimen Description BLOOD LEFT FOREARM   Final   Special Requests BOTTLES DRAWN AEROBIC AND ANAEROBIC 25ML   Final   Culture  Setup Time     Final   Value: 08/01/2013 02:36     Performed at [facility]   Culture     Final   Value:        BLOOD CULTURE RECEIVED NO GROWTH TO DATE CULTURE WILL BE HELD FOR 5 DAYS BEFORE ISSUING A FINAL NEGATIVE REPORT     Performed at [facility]   Report Status PENDING   Incomplete  URINE CULTURE     Status: None   Collection Time    08/02/13  8:49 AM      Result Value Ref Range Status   Specimen Description URINE, CLEAN CATCH   Final   Special Requests NONE   Final   Culture  Setup Time     Final   Value: 08/02/2013 12:59     Performed at [place]     Final   Value: NO GROWTH     Performed at [facility]   Culture     Final   Value: NO GROWTH     Performed at [facility]   Report Status 08/03/2013 FINAL   Final     Studies: No results found.  Scheduled Meds: . aspirin  325 mg Oral Daily  . timolol  1 drop Both Eyes BID   And  . brimonidine  1 drop Both Eyes BID  . brinzolamide  1 drop Right Eye TID  . calcium-vitamin D  1 tablet Oral Daily  . citalopram  10 mg Oral Daily  . ferrous sulfate  325 mg Oral Q breakfast  . latanoprost  1 drop Left Eye QHS  . levofloxacin  500 mg Oral Daily  . multivitamin with minerals  1 tablet Oral Daily  . pantoprazole  40 mg Oral Daily  . sodium chloride  3 mL Intravenous Q12H  . vitamin C  500 mg Oral Daily   Continuous Infusions:   Principal Problem:   Acute respiratory failure with  hypoxia Active Problems:   ANEMIA-IRON DEFICIENCY  GERD   Dementia   PNA (pneumonia)    Time spent: > 35 min    Larwance Sachs  [facility] Pager [phone]. If 7PM-7AM, please contact night-coverage at [email or web address], password [facility] 08/03/2013, 2:29 PM  LOS: 3 days

## 2013-08-03 NOTE — Evaluation (Signed)
Physical Therapy Evaluation Patient Details Name: VYCTORIA DICKMAN MRN: [id number] DOB: 05-Jun-1922 Today's Date: 08/03/2013 Time: 7169-6789 PT Time Calculation (min): 32 min  PT Assessment / Plan / Recommendation History of Present Illness  80 year old female with past medical history of dementia, CLL, anemia, depression who presented to [facility] ED 07/31/2013 with worsening shortness of breath and productive cough ongoing for past week prior to this admission. Shortness of breath is worse with exertion and additionally pt was unable to get out of bed. She has experience more weakness and fatigue. No reports of fever or chills. No chest pain. No palpitations. No abdominal pain and no nausea or vomiting.    Clinical Impression  Pt was very weak initially, had large amount of incontinence of urine in bed. Pt states she was sleeping, Pt will benefit from PT to address problems. Pt will benefit from SNF as pt lives alone.    PT Assessment  Patient needs continued PT services    Follow Up Recommendations  SNF    Does the patient have the potential to tolerate intense rehabilitation      Barriers to Discharge Decreased caregiver support      Equipment Recommendations  None recommended by PT    Recommendations for Other Services     Frequency Min 3X/week    Precautions / Restrictions Precautions Precautions: Fall Precaution Comments: incontinent   Pertinent Vitals/Pain Frequent cough, no pain      Mobility  Bed Mobility Overal bed mobility: Needs Assistance Bed Mobility: Supine to Sit Supine to sit: Mod assist;HOB elevated General bed mobility comments: extra time and effort to scoot to the edge as pt as saturated in urine. Transfers Overall transfer level: Needs assistance Equipment used: Rolling walker (2 wheeled) Transfers: Sit to/from [facility] Sit to Stand: Mod assist Stand pivot transfers: Mod assist;From elevated surface General transfer comment: assist to  stand from bed, improved from [facility], cues for ssafety. Ambulation/Gait Ambulation/Gait assistance: Mod assist Ambulation Distance (Feet): 30 Feet Assistive device: Rolling walker (2 wheeled) Gait velocity: attempted to ambulate initially after sitting on the edge of the bed but pt was very weak and unsteady,, Improved after getting up to the [facility] and was able to walk    Exercises     PT Diagnosis: Difficulty walking;Generalized weakness  PT Problem List: Decreased strength;Decreased activity tolerance;Decreased balance;Decreased mobility;Decreased knowledge of precautions;Decreased safety awareness;Decreased knowledge of use of DME PT Treatment Interventions: DME instruction;Gait training;Functional mobility training;Therapeutic activities;Therapeutic exercise;Patient/family education     PT Goals(Current goals can be found in the care plan section) Acute Rehab PT Goals Patient Stated Goal: I want to go home. I have been [facility]. PT Goal Formulation: With patient Time For Goal Achievement: 08/17/13 Potential to Achieve Goals: Good  Visit Information  Last PT Received On: 08/03/13 Assistance Needed: +1 History of Present Illness: 80 year old female with past medical history of dementia, CLL, anemia, depression who presented to [facility] ED 07/31/2013 with worsening shortness of breath and productive cough ongoing for past week prior to this admission. Shortness of breath is worse with exertion and additionally pt was unable to get out of bed. She has experience more weakness and fatigue. No reports of fever or chills. No chest pain. No palpitations. No abdominal pain and no nausea or vomiting.         Prior [place] expects to be discharged to:: Private residence Living Arrangements: Alone Available Help at Discharge: Available PRN/intermittently Type of  Home: House Additional Comments: granddaughter lives next door but works    Restaurant manager, fast food Arousal/Alertness: Awake/alert Behavior During Therapy: WFL for tasks assessed/performed Overall Cognitive Status: Impaired/Different from baseline Area of Impairment: Orientation Orientation Level: Place General Comments: pt thought she was across from [facility]    Extremity/Trunk Assessment Upper Extremity Assessment Upper Extremity Assessment: Generalized weakness Lower Extremity Assessment Lower Extremity Assessment: Generalized weakness   Balance Balance Overall balance assessment: Needs assistance Sitting-balance support: No upper extremity supported;Feet supported Sitting balance-Leahy Scale: Fair Standing balance support: Bilateral upper extremity supported;During functional activity Standing balance-Leahy Scale: Fair Standing balance comment: standing to perform pericare  End of Session PT - End of Session Equipment Utilized During Treatment: Gait belt Activity Tolerance: Patient tolerated treatment well Patient left: in chair;with call bell/phone within reach;with chair alarm set Nurse Communication: Mobility status  GP     Claretha Cooper 08/03/2013, 1:03 PM Tresa Endo PT [phone]

## 2013-08-03 NOTE — Progress Notes (Signed)
Clinical Social Work Department BRIEF PSYCHOSOCIAL ASSESSMENT 08/03/2013  Patient:  Audrey Spencer, Audrey Spencer     Account Number:  [id number]     Admit date:  07/31/2013  Clinical Social Worker:  Levie Heritage  Date/Time:  08/03/2013 03:46 PM  Referred by:  Physician  Date Referred:  08/03/2013 Referred for  SNF Placement   Other Referral:   Interview type:  Patient Other interview type:    PSYCHOSOCIAL DATA Living Status:  ALONE Admitted from facility:   Level of care:   Primary support name:  Audrey Spencer Primary support relationship to patient:  FAMILY Degree of support available:   strong    CURRENT CONCERNS Current Concerns  Post-Acute Placement   Other Concerns:    SOCIAL WORK ASSESSMENT / PLAN Met with Pt to discuss d/c plans.    Pt confirmed that she lives along, however she stated that she has a strong support system around her, including her granddaughter, Audrey Spencer, who lives behind her.    CSW and Pt discussed PT's SNF recommendation and Pt stated that she's in agreement.  She stated that she considered 24/hr care but that she knows it's expensive and that she would probably fare better at a SNF.    Pt stated that she's been to [facility] [place] in the recent past and that she'd like to return there upon d/c.  She understands that they may be full and, to that end, she gave CSW permission to search [place] in its entirity.    CSW provided Pt with a SNF list and answered all questions.    CSW thanked Pt for her time.   Assessment/plan status:  Psychosocial Support/Ongoing Assessment of Needs Other assessment/ plan:   Information/referral to community resources:   SNF list    PATIENT'S/FAMILY'S RESPONSE TO PLAN OF CARE: Pt was calm, cooperative and very pleasant.    Pt stated that she'd love to return home but that she knows that she's not strong enough, yet, to be by herself.  She is happy to go to [facility] in [place] for rehab.    Pt thanked  CSW for time and assistance.   Bernita Raisin, [place] Work [phone]

## 2013-08-03 NOTE — Progress Notes (Signed)
Clinical Social Work Department CLINICAL SOCIAL WORK PLACEMENT NOTE 08/03/2013  Patient:  Audrey Spencer, Audrey Spencer  Account Number:  [id number] Admit date:  07/31/2013  Clinical Social Worker:  Levie Heritage  Date/time:  08/03/2013 03:54 PM  Clinical Social Work is seeking post-discharge placement for this patient at the following level of care:   SKILLED NURSING   (*CSW will update this form in Epic as items are completed)   08/03/2013  Patient/family provided with [place] Department of Clinical Social Work's list of facilities offering this level of care within the geographic area requested by the patient (or if unable, by the patient's family).  08/03/2013  Patient/family informed of their freedom to choose among providers that offer the needed level of care, that participate in Medicare, Medicaid or managed care program needed by the patient, have an available bed and are willing to accept the patient.  08/03/2013  Patient/family informed of [facility]' ownership interest in [facility], as well as of the fact that they are under no obligation to receive care at this facility.  PASARR submitted to EDS on existing PASARR number received from EDS on   FL2 transmitted to all facilities in geographic area requested by pt/family on  08/03/2013 FL2 transmitted to all facilities within larger geographic area on   Patient informed that his/her managed care company has contracts with or will negotiate with  certain facilities, including the following:     Patient/family informed of bed offers received:   Patient chooses bed at  Physician recommends and patient chooses bed at    Patient to be transferred to  on   Patient to be transferred to facility by   The following physician request were entered in Epic:   Additional Comments:  Bernita Raisin, [place] Work [phone]

## 2013-08-03 NOTE — Progress Notes (Signed)
Chart review.    Noted that Pt and family wanting [facility] in [place] for SNF.  CSW contacted that facility 2x and wasn't able to get anyone to answer the phone, in spite of pressing the extension for the RN's station.  Weekday CSW to follow.  Bernita Raisin, [place] Work [phone]

## 2013-08-04 LAB — CBC
HCT: 32.3 % — ABNORMAL LOW (ref 36.0–46.0)
HEMOGLOBIN: 10.6 g/dL — AB (ref 12.0–15.0)
MCH: 32.2 pg (ref 26.0–34.0)
MCHC: 32.8 g/dL (ref 30.0–36.0)
MCV: 98.2 fL (ref 78.0–100.0)
PLATELETS: 151 10*3/uL (ref 150–400)
RBC: 3.29 MIL/uL — AB (ref 3.87–5.11)
RDW: 13.7 % (ref 11.5–15.5)
WBC: 7.8 10*3/uL (ref 4.0–10.5)

## 2013-08-04 LAB — GLUCOSE, CAPILLARY: Glucose-Capillary: 128 mg/dL — ABNORMAL HIGH (ref 70–99)

## 2013-08-04 MED ORDER — LEVOFLOXACIN 500 MG PO TABS
500.0000 mg | ORAL_TABLET | Freq: Every day | ORAL | Status: DC
Start: 2013-08-04 — End: 2013-10-25

## 2013-08-04 NOTE — Discharge Summary (Addendum)
Physician Discharge Summary  Audrey Spencer QJJ:[id number] DOB: [birth year] DOA: 07/31/2013  PCP: [name], MD  Admit date: 07/31/2013 Discharge date: 08/04/2013  Time spent: > 35 minutes  Recommendations for Outpatient Follow-up:  1. Patient was found to have strep pneumonia urinary antigen 2. Please reassess and decide whether or not patient will require a longer antibiotic regimen.  Will discharge on 3 more days of levaquin which should complete a total of 8 days of antibiotic regimen.  Discharge Diagnoses:  Principal Problem:   Acute respiratory failure with hypoxia Active Problems:   ANEMIA-IRON DEFICIENCY   GERD   Dementia   PNA (pneumonia)   Discharge Condition: Stable  Diet recommendation: Low sodium heart healthy  Filed Weights   08/02/13 0600 08/03/13 0431 08/04/13 0501  Weight: 72.6 kg (160 lb 0.9 oz) 74 kg (163 lb 2.3 oz) 74.3 kg (163 lb 12.8 oz)    History of present illness:  Pt is an 80 y/o with past medical history of dementia, CLL, depression who presented to [facility] ED 07/31/2013 with worsening shortness of breath and productive cough ongoing for past week prior to this admission.  Hospital Course:  Acute respiratory failure with hypoxia  - likely secondary to PNA. PE ruled out with CT angio  - Blood cx: NTD  - Strep Pneumoniae urine antigen Positive  - Influenza- negative  - Will d/c on levaquin for 3 more days to complete a total course of 8 days of antibiotic therapy.  Pneumonia  - abx as above   - Family reports that they will stay with patient at her home and provide continued care on discharge.  They currently do not want SNF.  Leukocytosis  - Resolved, likely related to PNA  - abx as above  - afebrile   Right lung base density  - Oncology on board and currently not recommending any invasive procedures for work up. Recommends treating pneumonia at this time.   Anemia-Iron deficiency  -continue ferrous sulfate supplements   GERD  - continue home  PPI   Dementia  -Stable    Procedures:  None  Consultations:  Oncology: Dr. Beryle Beams  Discharge Exam: Filed Vitals:   08/04/13 0501  BP: 155/65  Pulse: 94  Temp: 98.2 F (36.8 C)  Resp: 18    General: Pt in NAD, Alert and awake Cardiovascular: RRR, no MRG Respiratory: CTA BL, no wheezes  Discharge Instructions  Discharge Orders   Future Appointments Provider Department Dept Phone   10/11/2013 2:30 PM Biagio Borg, MD [facility] [place] [phone]   10/25/2013 12:30 PM [facility] [phone]   10/25/2013 1:00 PM Heath Lark, MD [facility] [phone]   Future Orders Complete By Expires   Call MD for:  difficulty breathing, headache or visual disturbances  As directed    Call MD for:  temperature >100.4  As directed    Diet - low sodium heart healthy  As directed    Discharge instructions  As directed    Comments:     Please be sure to follow up with your primary care physician in 1-2 weeks or sooner should any new concerns arise.   Increase activity slowly  As directed        Medication List         aspirin 325 MG tablet  Take 325 mg by mouth daily.     AZOPT 1 % ophthalmic suspension  Generic drug:  brinzolamide  Place 1 drop into the right eye Twice daily.     CALCIUM 600+D 600-400 MG-UNIT per tablet  Generic drug:  Calcium Carbonate-Vitamin D  Take 1 tablet by mouth daily.     citalopram 10 MG tablet  Commonly known as:  CELEXA  Take 10 mg by mouth daily.     COMBIGAN 0.2-0.5 % ophthalmic solution  Generic drug:  brimonidine-timolol  Place 1 drop into both eyes 2 (two) times daily.     ferrous sulfate 325 (65 FE) MG tablet  Take 325 mg by mouth daily with breakfast.     latanoprost 0.005 % ophthalmic solution  Commonly known as:  XALATAN  Place into the left eye at bedtime.     levofloxacin 500 MG tablet  Commonly known as:  LEVAQUIN   Take 1 tablet (500 mg total) by mouth daily.     multivitamin with minerals Tabs tablet  Take 1 tablet by mouth daily.     omeprazole 20 MG capsule  Commonly known as:  PRILOSEC  Take 20 mg by mouth daily.     vitamin C 500 MG tablet  Commonly known as:  ASCORBIC ACID  Take 500 mg by mouth daily.       Allergies  Allergen Reactions  . Pseudoephedrine Other (See Comments)    insomnia  . Codeine     REACTION: nausea (mild)  . Doxycycline     REACTION: Nausea  . Nsaids     REACTION: GI bleed/anemia  . Solifenacin Succinate     REACTION: blurred vision      The results of significant diagnostics from this hospitalization (including imaging, microbiology, ancillary and laboratory) are listed below for reference.    Significant Diagnostic Studies: Dg Chest 2 View  07/31/2013   CLINICAL DATA:  Weakness. History of breast cancer and chronic lymphocytic leukemia.  EXAM: CHEST  2 VIEW  COMPARISON:  CT chest 07/28/2010.  PA and lateral chest 03/17/2010.  FINDINGS: The lungs are clear. Heart size is mildly enlarged. No pneumothorax or pleural effusion. Postoperative change of lower thoracic and upper lumbar vertebroplasty noted.  IMPRESSION: No acute disease.   Electronically Signed   By: Inge Rise M.D.   On: 07/31/2013 13:31   Ct Angio Chest Pe W/cm &/or Wo Cm  07/31/2013   CLINICAL DATA:  Tachycardia and hypoxia  EXAM: CT ANGIOGRAPHY CHEST WITH CONTRAST  TECHNIQUE: Multidetector CT imaging of the chest was performed using the standard protocol during bolus administration of intravenous contrast. Multiplanar CT image reconstructions and MIPs were obtained to evaluate the vascular anatomy.  CONTRAST:  87mL OMNIPAQUE IOHEXOL 350 MG/ML SOLN  COMPARISON:  DG CHEST 2 VIEW dated 07/31/2013; CT CHEST W/CM dated 06/25/2009  FINDINGS: The thoracic inlet demonstrates and prominent lymph nodes within the base of the neck the largest is in and infraclavicular region on the right image number 8  series 4 measuring 9 mm in short axis.  There is no evidence of mediastinal masses. A 1.4 cm precarinal lymph node is identified image 35 series 4. An enlarged subcarinal lymph node is appreciated image 41 series 4 measuring 1.5 cm. An enlarged lymph node is also identified just inferior and medial to the bronchus of the right lower lobe.  There is no evidence of filling defects within the main, lobar, or segmental pulmonary arteries. There is no evidence of a thoracic aortic aneurysm nor dissection.  Evaluation of the lung parenchyma demonstrates nodular densities within the posterior basal segment right  lower lobe best appreciated image 72 series 11. These findings are more prominent when compared to previous study dated 06/25/2009. There is increased prominence of the interstitial markings within the lung bases as well as areas of mild pleural thickening. No further focal regions of consolidation or evidence of focal infiltrates are appreciated. The central airways are patent.  Stable cysts are appreciated within the liver and right kidney. Remaining visualized upper abdominal viscera otherwise are grossly unremarkable.  There is no evidence of aggressive appearing osseous lesions. Areas of prior cement augmentation are identified within the lower thoracic and upper lumbar spine.  Review of the MIP images confirms the above findings.  IMPRESSION: 1. No CT evidence of pulmonary arterial embolic disease. 2. There has been increased prominence of lymph nodes within the base the neck as well as development of adenopathy within the mediastinum when compared to prior study. The patient has a history of lymphoma and recurrence is of diagnostic concern. Further evaluation with oncology consultation if not already obtained is recommended. These results will be called to the ordering clinician or representative by the Radiologist Assistant, and communication documented in the PACS Dashboard. 3. Ill-defined area of  increased density within the right lung base slightly more prominent when compared to previous study differential considerations include infiltrate versus atelectasis versus a area of recurrent or active neoplastic disease. 4. There are also increased, slightly, interstitial fibrotic changes within the lung bases.   Electronically Signed   By: Margaree Mackintosh M.D.   On: 07/31/2013 16:53    Microbiology: Recent Results (from the past 240 hour(s))  CULTURE, BLOOD (ROUTINE X 2)     Status: None   Collection Time    07/31/13  5:40 PM      Result Value Ref Range Status   Specimen Description BLOOD LEFT ARM   Final   Special Requests BOTTLES DRAWN AEROBIC AND ANAEROBIC 5CC   Final   Culture  Setup Time     Final   Value: 08/01/2013 02:37     Performed at [facility]   Culture     Final   Value:        BLOOD CULTURE RECEIVED NO GROWTH TO DATE CULTURE WILL BE HELD FOR 5 DAYS BEFORE ISSUING A FINAL NEGATIVE REPORT     Performed at [facility]   Report Status PENDING   Incomplete  CULTURE, BLOOD (ROUTINE X 2)     Status: None   Collection Time    07/31/13  5:40 PM      Result Value Ref Range Status   Specimen Description BLOOD LEFT FOREARM   Final   Special Requests BOTTLES DRAWN AEROBIC AND ANAEROBIC 25ML   Final   Culture  Setup Time     Final   Value: 08/01/2013 02:36     Performed at [facility]   Culture     Final   Value:        BLOOD CULTURE RECEIVED NO GROWTH TO DATE CULTURE WILL BE HELD FOR 5 DAYS BEFORE ISSUING A FINAL NEGATIVE REPORT     Performed at [facility]   Report Status PENDING   Incomplete  URINE CULTURE     Status: None   Collection Time    08/02/13  8:49 AM      Result Value Ref Range Status   Specimen Description URINE, CLEAN CATCH   Final   Special Requests NONE   Final   Culture  Setup Time  Final   Value: 08/02/2013 12:59     Performed at [facility]     Final   Value: NO GROWTH     Performed at  [facility]   Culture     Final   Value: NO GROWTH     Performed at [facility]   Report Status 08/03/2013 FINAL   Final     Labs: Basic Metabolic Panel:  Recent Labs Lab 07/31/13 1230 08/01/13 0340 08/03/13 0350  NA 139 140 139  K 3.8 3.9 3.7  CL 95* 100 98  CO2 26 27 34*  GLUCOSE 162* 161* 127*  BUN 16 20 12   CREATININE 0.54 0.58 0.49*  CALCIUM 9.1 8.7 8.6   Liver Function Tests:  Recent Labs Lab 07/31/13 1230 08/01/13 0340  AST 20 26  ALT 11 17  ALKPHOS 77 65  BILITOT 0.8 0.4  PROT 6.8 6.1  ALBUMIN 3.4* 2.9*   No results found for this basename: LIPASE, AMYLASE,  in the last 168 hours No results found for this basename: AMMONIA,  in the last 168 hours CBC:  Recent Labs Lab 07/31/13 1230 08/01/13 0340 08/03/13 0350 08/04/13 0401  WBC 9.9 11.7* 8.6 7.8  NEUTROABS 5.8  --   --   --   HGB 12.9 11.3* 10.6* 10.6*  HCT 38.7 34.8* 33.6* 32.3*  MCV 96.3 98.3 99.4 98.2  PLT 146* 148* 125* 151   Cardiac Enzymes: No results found for this basename: CKTOTAL, CKMB, CKMBINDEX, TROPONINI,  in the last 168 hours BNP: BNP (last 3 results) No results found for this basename: PROBNP,  in the last 8760 hours CBG:  Recent Labs Lab 08/01/13 0844 08/02/13 0748 08/03/13 0805 08/04/13 0640  GLUCAP 137* 168* 137* 128*       Signed:  Velvet Bathe  [facility] 08/04/2013, 12:26 PM    Addendum: Will place order for home health: PT/RN/ and home health aide.

## 2013-08-05 ENCOUNTER — Other Ambulatory Visit: Payer: Self-pay | Admitting: Internal Medicine

## 2013-08-05 NOTE — Progress Notes (Signed)
   CARE MANAGEMENT NOTE 08/05/2013  Patient:  Audrey Spencer, Audrey Spencer   Account Number:  [id number]  Date Initiated:  08/02/2013  Documentation initiated by:  McGIBBONEY,COOKIE  Subjective/Objective Assessment:   80YO PT ADMITTED WITH CCO SOB, SATD 89%     Action/Plan:   FROM HOME   Anticipated DC Date:  08/05/2013   Anticipated DC Plan:  [name]  CM consult      [facility] Choice  HOME HEALTH   Choice offered to / List presented to:  C-1 Patient        [name] arranged  HH-1 RN  [place].   Status of service:  Completed, signed off Medicare Important Message given?  NA - LOS <3 / Initial given by admissions (If response is "NO", the following Medicare IM given date fields will be blank) Date Medicare IM given:   Date Additional Medicare IM given:    Discharge Disposition:  [name]  Per UR Regulation:  Reviewed for med. necessity/level of care/duration of stay  If discussed at [name] of Stay Meetings, dates discussed:    Comments:  08/05/2013 late entry- 08/04/2013 1400 NCM spoke to pt  and offered choice for [facility]. Pt requesting [facility] for HH. Jonnie Finner RN CCM Case Mgmt Phone [phone]  08/02/13 MMCGIBBONEY, RN, BSN CHART REVIEWED. PT FROM HOME.

## 2013-08-07 LAB — CULTURE, BLOOD (ROUTINE X 2)
CULTURE: NO GROWTH
Culture: NO GROWTH

## 2013-08-08 ENCOUNTER — Other Ambulatory Visit: Payer: Self-pay | Admitting: *Deleted

## 2013-08-08 DIAGNOSIS — C50919 Malignant neoplasm of unspecified site of unspecified female breast: Secondary | ICD-10-CM

## 2013-08-08 NOTE — Progress Notes (Signed)
Niece, Sheppard Evens called.   Natosha needs mammogram and left diagnostic mammogram.  Ned Card NP ordered for February, but it was cancelled as patient was in the hospital.  Niece called the [facility], but they need a new order.  Discussed with Ned Card and new orders put in.

## 2013-08-09 ENCOUNTER — Other Ambulatory Visit: Payer: Self-pay | Admitting: Nurse Practitioner

## 2013-08-09 DIAGNOSIS — N6459 Other signs and symptoms in breast: Secondary | ICD-10-CM

## 2013-08-09 DIAGNOSIS — C50919 Malignant neoplasm of unspecified site of unspecified female breast: Secondary | ICD-10-CM

## 2013-08-11 ENCOUNTER — Telehealth: Payer: Self-pay | Admitting: Oncology

## 2013-08-11 NOTE — Telephone Encounter (Signed)
mammo/us per 3/12 pof already on schedule. no other orders.

## 2013-08-21 ENCOUNTER — Ambulatory Visit
Admission: RE | Admit: 2013-08-21 | Discharge: 2013-08-21 | Disposition: A | Discharge status: Home / Self Care | Payer: Medicare Other | Source: Ambulatory Visit | Attending: Nurse Practitioner | Admitting: Nurse Practitioner

## 2013-08-21 DIAGNOSIS — N6459 Other signs and symptoms in breast: Secondary | ICD-10-CM

## 2013-08-21 DIAGNOSIS — C50919 Malignant neoplasm of unspecified site of unspecified female breast: Secondary | ICD-10-CM

## 2013-08-23 DIAGNOSIS — F0390 Unspecified dementia without behavioral disturbance: Secondary | ICD-10-CM

## 2013-08-23 DIAGNOSIS — Z856 Personal history of leukemia: Secondary | ICD-10-CM

## 2013-08-23 DIAGNOSIS — D509 Iron deficiency anemia, unspecified: Secondary | ICD-10-CM

## 2013-08-23 DIAGNOSIS — K219 Gastro-esophageal reflux disease without esophagitis: Secondary | ICD-10-CM

## 2013-09-13 DIAGNOSIS — Z856 Personal history of leukemia: Secondary | ICD-10-CM

## 2013-09-13 DIAGNOSIS — D509 Iron deficiency anemia, unspecified: Secondary | ICD-10-CM

## 2013-09-13 DIAGNOSIS — K219 Gastro-esophageal reflux disease without esophagitis: Secondary | ICD-10-CM

## 2013-09-13 DIAGNOSIS — F0390 Unspecified dementia without behavioral disturbance: Secondary | ICD-10-CM

## 2013-10-11 ENCOUNTER — Ambulatory Visit (INDEPENDENT_AMBULATORY_CARE_PROVIDER_SITE_OTHER): Payer: Medicare Other | Admitting: Internal Medicine

## 2013-10-11 ENCOUNTER — Other Ambulatory Visit (INDEPENDENT_AMBULATORY_CARE_PROVIDER_SITE_OTHER): Payer: Medicare Other

## 2013-10-11 ENCOUNTER — Encounter: Payer: Self-pay | Admitting: Internal Medicine

## 2013-10-11 VITALS — BP 120/78 | HR 78 | Temp 97.1°F | Wt 155.5 lb

## 2013-10-11 DIAGNOSIS — R350 Frequency of micturition: Secondary | ICD-10-CM

## 2013-10-11 DIAGNOSIS — F0390 Unspecified dementia without behavioral disturbance: Secondary | ICD-10-CM

## 2013-10-11 DIAGNOSIS — K219 Gastro-esophageal reflux disease without esophagitis: Secondary | ICD-10-CM

## 2013-10-11 LAB — CBC WITH DIFFERENTIAL/PLATELET
BASOS ABS: 0 10*3/uL (ref 0.0–0.1)
Basophils Relative: 0.3 % (ref 0.0–3.0)
EOS PCT: 1.8 % (ref 0.0–5.0)
Eosinophils Absolute: 0.2 10*3/uL (ref 0.0–0.7)
HEMATOCRIT: 38.1 % (ref 36.0–46.0)
Hemoglobin: 12.7 g/dL (ref 12.0–15.0)
Lymphocytes Relative: 53.3 % — ABNORMAL HIGH (ref 12.0–46.0)
Lymphs Abs: 4.4 10*3/uL — ABNORMAL HIGH (ref 0.7–4.0)
MCHC: 33.2 g/dL (ref 30.0–36.0)
MCV: 98.2 fl (ref 78.0–100.0)
MONO ABS: 0.5 10*3/uL (ref 0.1–1.0)
Monocytes Relative: 6.2 % (ref 3.0–12.0)
NEUTROS PCT: 38.4 % — AB (ref 43.0–77.0)
Neutro Abs: 3.2 10*3/uL (ref 1.4–7.7)
PLATELETS: 158 10*3/uL (ref 150.0–400.0)
RBC: 3.88 Mil/uL (ref 3.87–5.11)
RDW: 16.3 % — AB (ref 11.5–15.5)
WBC: 8.3 10*3/uL (ref 4.0–10.5)

## 2013-10-11 LAB — HEPATIC FUNCTION PANEL
ALK PHOS: 59 U/L (ref 39–117)
ALT: 14 U/L (ref 0–35)
AST: 25 U/L (ref 0–37)
Albumin: 4.1 g/dL (ref 3.5–5.2)
Bilirubin, Direct: 0.1 mg/dL (ref 0.0–0.3)
Total Bilirubin: 0.6 mg/dL (ref 0.2–1.2)
Total Protein: 6.4 g/dL (ref 6.0–8.3)

## 2013-10-11 LAB — BASIC METABOLIC PANEL
BUN: 15 mg/dL (ref 6–23)
CHLORIDE: 96 meq/L (ref 96–112)
CO2: 33 mEq/L — ABNORMAL HIGH (ref 19–32)
Calcium: 9 mg/dL (ref 8.4–10.5)
Creatinine, Ser: 0.7 mg/dL (ref 0.4–1.2)
GFR: 91.33 mL/min (ref >60.00)
Glucose, Bld: 93 mg/dL (ref 70–99)
POTASSIUM: 4.1 meq/L (ref 3.5–5.1)
Sodium: 136 mEq/L (ref 135–145)

## 2013-10-11 MED ORDER — MEMANTINE HCL 5 MG PO TABS: 5.0000 mg | ORAL_TABLET | Freq: Two times a day (BID) | ORAL | Status: DC

## 2013-10-11 MED ORDER — DONEPEZIL HCL 5 MG PO TABS: 5.0000 mg | ORAL_TABLET | Freq: Every day | ORAL | Status: DC

## 2013-10-11 NOTE — Progress Notes (Signed)
Subjective:    Patient ID: Audrey Spencer, female    DOB: Apr 21, 1923, 80 y.o.   MRN: [id number]  HPI  Here to f/u; overall doing ok,  Pt denies chest pain, increased sob or doe, wheezing, orthopnea, PND, increased LE swelling, palpitations, dizziness or syncope.  Pt denies polydipsia, polyuria, or low sugar symptoms such as weakness or confusion improved with po intake.  Pt denies new neurological symptoms such as new headache, or facial or extremity weakness or numbness.   Pt states overall good compliance with meds, has been trying to follow lower cholesterol, diabetic diet, with wt overall stable,  but little exercise however.  Dementia overall stable symptomatically with gradual worsening at best, and not assoc with behavioral changes such as hallucinations, paranoia, or agitation. Daughter would like to try add namenda.  Had one fall with tripping at home over bedroom shoes, with bruise to leg only.  Pt denies fever, wt loss, night sweats, loss of appetite, or other constitutional symptoms.  Bright and plasant today.  Recent mammogram mar 2015 neg. Denies worsening reflux, abd pain, dysphagia, n/v, bowel change or blood.Ask for UA - r/o UTI Past Medical History  Diagnosis Date  . Dementia 01/25/2011  . CLL 09/22/2009  . Personal history of malignant neoplasm of breast 09/22/2009  . ANEMIA-IRON DEFICIENCY 04/09/2007  . Bronchiectasis without acute exacerbation 01/07/2009  . GERD 02/26/2008  . HYPERLIPIDEMIA 04/09/2007  . OBSTRUCTIVE SLEEP APNEA 04/22/2008  . Pancytopenia 03/19/2009  . Recurrent UTI 01/25/2011  . DIVERTICULOSIS, COLON 04/15/2010  . GLAUCOMA 04/09/2007  . 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 OSTEOPOROSIS 03/19/2009  . RECTOCELE WITHOUT MENTION OF UTERINE PROLAPSE 04/15/2010  . Depression 05/03/2012  . Stage I breast cancer 06/29/2012    Left breast stage 1 dx 08/1996;  ER positive Rx lumpectomy/RT/Tamoxifen   Past Surgical History  Procedure Laterality Date  . Breast  surgery      lumpectomy  . Abdominal hysterectomy    . Rotater cuff      reports that she has never smoked. She does not have any smokeless tobacco history on file. She reports that she does not drink alcohol or use illicit drugs. family history includes Cancer in her brother and sister; Hypertension in her father. Allergies  Allergen Reactions  . Pseudoephedrine Other (See Comments)    insomnia  . Codeine     REACTION: nausea (mild)  . Doxycycline     REACTION: Nausea  . Nsaids     REACTION: GI bleed/anemia  . Solifenacin Succinate     REACTION: blurred vision   Current Outpatient Prescriptions on File Prior to Visit  Medication Sig Dispense Refill  . aspirin 325 MG tablet Take 325 mg by mouth daily.      . AZOPT 1 % ophthalmic suspension Place 1 drop into the right eye Twice daily.      . Calcium Carbonate-Vitamin D (CALCIUM 600+D) 600-400 MG-UNIT per tablet Take 1 tablet by mouth daily.      . citalopram (CELEXA) 10 MG tablet Take 10 mg by mouth daily.      . COMBIGAN 0.2-0.5 % ophthalmic solution Place 1 drop into both eyes 2 (two) times daily.       . ferrous sulfate 325 (65 FE) MG tablet Take 325 mg by mouth daily with breakfast.      . latanoprost (XALATAN) 0.005 % ophthalmic solution Place into the left eye at bedtime.      Marland Kitchen levofloxacin (LEVAQUIN) 500 MG tablet Take  1 tablet (500 mg total) by mouth daily.  3 tablet  0  . Multiple Vitamin (MULTIVITAMIN WITH MINERALS) TABS tablet Take 1 tablet by mouth daily.      Marland Kitchen omeprazole (PRILOSEC) 20 MG capsule Take 20 mg by mouth daily.      . vitamin C (ASCORBIC ACID) 500 MG tablet Take 500 mg by mouth daily.       No current facility-administered medications on file prior to visit.   Review of Systems  Constitutional: Negative for unusual diaphoresis or other sweats  HENT: Negative for ringing in ear Eyes: Negative for double vision or worsening visual disturbance.  Respiratory: Negative for choking and stridor.     Gastrointestinal: Negative for vomiting or other signifcant bowel change Genitourinary: Negative for hematuria or decreased urine volume.  Musculoskeletal: Negative for other MSK pain or swelling Skin: Negative for color change and worsening wound.  Neurological: Negative for tremors and numbness other than noted  Psychiatric/Behavioral: Negative for decreased concentration or agitation other than above       Objective:   Physical Exam BP 120/78  Pulse 78  Temp(Src) 97.1 F (36.2 C) (Oral)  Wt 155 lb 8 oz (70.534 kg)  SpO2 90% VS noted,  Constitutional: Pt appears well-developed, well-nourished.  HENT: Head: NCAT.  Right Ear: External ear normal.  Left Ear: External ear normal.  Eyes: . Pupils are equal, round, and reactive to light. Conjunctivae and EOM are normal Neck: Normal range of motion. Neck supple.  Cardiovascular: Normal rate and regular rhythm.   Pulmonary/Chest: Effort normal and breath sounds normal.  Abd:  Soft, NT, ND, + BS Neurological: Pt is alert. + confused , motor grossly intact Skin: Skin is warm. No rash Psychiatric: Pt behavior is normal. No agitation.     Assessment & Plan:

## 2013-10-11 NOTE — Assessment & Plan Note (Signed)
For ua

## 2013-10-11 NOTE — Assessment & Plan Note (Signed)
stable overall by history and exam, , and pt to continue medical treatment as before,  to f/u any worsening symptoms or concerns  

## 2013-10-11 NOTE — Assessment & Plan Note (Signed)
Ok to try add namenda 5 bid, o/w stable overall by history and exam, recent data reviewed with pt, and pt to continue medical treatment as before,  to f/u any worsening symptoms or concerns Lab Results  Component Value Date   WBC 7.8 08/04/2013   HGB 10.6* 08/04/2013   HCT 32.3* 08/04/2013   PLT 151 08/04/2013   GLUCOSE 127* 08/03/2013   CHOL 239* 07/27/2011   TRIG 127.0 07/27/2011   HDL 94.70 07/27/2011   LDLDIRECT 131.6 07/27/2011   ALT 17 08/01/2013   AST 26 08/01/2013   NA 139 08/03/2013   K 3.7 08/03/2013   CL 98 08/03/2013   CREATININE 0.49* 08/03/2013   BUN 12 08/03/2013   CO2 34* 08/03/2013   TSH 1.80 07/27/2011   INR 0.90* 06/04/2010   HGBA1C  Value: 6.2 (NOTE)                                                                       According to the ADA Clinical Practice Recommendations for 2011, when HbA1c is used as a screening test:   >=6.5%   Diagnostic of Diabetes Mellitus           (if abnormal result  is confirmed)  5.7-6.4%   Increased risk of developing Diabetes Mellitus  References:Diagnosis and Classification of Diabetes Mellitus,Diabetes HUDJ,4970,26(VZCHY 1):S62-S69 and Standards of Medical Care in         Diabetes - 2011,Diabetes IFOY,7741,28  (Suppl 1):S11-S61.* 03/17/2010

## 2013-10-11 NOTE — Progress Notes (Signed)
Pre visit review using our clinic review tool, if applicable. No additional management support is needed unless otherwise documented below in the visit note. 

## 2013-10-11 NOTE — Patient Instructions (Signed)
Please take all new medication as prescribed  Please continue all other medications as before, and refills have been done if requested. Please have the pharmacy call with any other refills you may need.  Please go to the LAB in the Basement (turn left off the elevator) for the tests to be done today  You will be contacted by phone if any changes need to be made immediately.  Otherwise, you will receive a letter about your results with an explanation, but please check with MyChart first.  Please remember to sign up for MyChart if you have not done so, as this will be important to you in the future with finding out test results, communicating by private email, and scheduling acute appointments online when needed.  Please return in 6 months, or sooner if needed

## 2013-10-14 LAB — URINALYSIS, ROUTINE W REFLEX MICROSCOPIC
Bilirubin Urine: NEGATIVE
Ketones, ur: NEGATIVE
Leukocytes, UA: NEGATIVE
Nitrite: NEGATIVE
PH: 5.5 (ref 5.0–8.0)
SPECIFIC GRAVITY, URINE: 1.025 (ref 1.000–1.030)
TOTAL PROTEIN, URINE-UPE24: NEGATIVE
URINE GLUCOSE: NEGATIVE
Urobilinogen, UA: 0.2 (ref 0.0–1.0)

## 2013-10-25 ENCOUNTER — Other Ambulatory Visit (HOSPITAL_BASED_OUTPATIENT_CLINIC_OR_DEPARTMENT_OTHER): Payer: Medicare Other

## 2013-10-25 ENCOUNTER — Telehealth: Payer: Self-pay | Admitting: Hematology and Oncology

## 2013-10-25 ENCOUNTER — Ambulatory Visit (HOSPITAL_BASED_OUTPATIENT_CLINIC_OR_DEPARTMENT_OTHER): Payer: Medicare Other | Admitting: Hematology and Oncology

## 2013-10-25 ENCOUNTER — Other Ambulatory Visit (HOSPITAL_COMMUNITY)
Admission: RE | Admit: 2013-10-25 | Discharge: 2013-10-25 | Disposition: A | Discharge status: Home / Self Care | Payer: Medicare Other | Source: Ambulatory Visit | Attending: Hematology and Oncology | Admitting: Hematology and Oncology

## 2013-10-25 ENCOUNTER — Other Ambulatory Visit: Payer: Self-pay | Admitting: Hematology and Oncology

## 2013-10-25 ENCOUNTER — Encounter: Payer: Self-pay | Admitting: Hematology and Oncology

## 2013-10-25 VITALS — BP 141/48 | HR 80 | Temp 97.5°F | Resp 18 | Ht 63.0 in | Wt 155.5 lb

## 2013-10-25 DIAGNOSIS — Z853 Personal history of malignant neoplasm of breast: Secondary | ICD-10-CM

## 2013-10-25 DIAGNOSIS — D696 Thrombocytopenia, unspecified: Secondary | ICD-10-CM

## 2013-10-25 DIAGNOSIS — C9110 Chronic lymphocytic leukemia of B-cell type not having achieved remission: Secondary | ICD-10-CM | POA: Insufficient documentation

## 2013-10-25 DIAGNOSIS — D649 Anemia, unspecified: Secondary | ICD-10-CM

## 2013-10-25 DIAGNOSIS — F0390 Unspecified dementia without behavioral disturbance: Secondary | ICD-10-CM

## 2013-10-25 LAB — COMPREHENSIVE METABOLIC PANEL (CC13)
ALT: 13 U/L (ref 0–55)
ANION GAP: 10 meq/L (ref 3–11)
AST: 20 U/L (ref 5–34)
Albumin: 3.6 g/dL (ref 3.5–5.0)
Alkaline Phosphatase: 68 U/L (ref 40–150)
BILIRUBIN TOTAL: 0.38 mg/dL (ref 0.20–1.20)
BUN: 15.8 mg/dL (ref 7.0–26.0)
CO2: 27 meq/L (ref 22–29)
Calcium: 8.8 mg/dL (ref 8.4–10.4)
Chloride: 103 mEq/L (ref 98–109)
Creatinine: 0.7 mg/dL (ref 0.6–1.1)
GLUCOSE: 103 mg/dL (ref 70–140)
Potassium: 4.3 mEq/L (ref 3.5–5.1)
Sodium: 141 mEq/L (ref 136–145)
TOTAL PROTEIN: 6 g/dL — AB (ref 6.4–8.3)

## 2013-10-25 LAB — LACTATE DEHYDROGENASE (CC13): LDH: 195 U/L (ref 125–245)

## 2013-10-25 LAB — CBC WITH DIFFERENTIAL/PLATELET
BASO%: 0.2 % (ref 0.0–2.0)
Basophils Absolute: 0 10*3/uL (ref 0.0–0.1)
EOS%: 1.8 % (ref 0.0–7.0)
Eosinophils Absolute: 0.2 10*3/uL (ref 0.0–0.5)
HCT: 36.4 % (ref 34.8–46.6)
HGB: 11.9 g/dL (ref 11.6–15.9)
LYMPH%: 54 % — AB (ref 14.0–49.7)
MCH: 32.3 pg (ref 25.1–34.0)
MCHC: 32.7 g/dL (ref 31.5–36.0)
MCV: 98.7 fL (ref 79.5–101.0)
MONO#: 0.5 10*3/uL (ref 0.1–0.9)
MONO%: 5.7 % (ref 0.0–14.0)
NEUT#: 3.3 10*3/uL (ref 1.5–6.5)
NEUT%: 38.3 % — ABNORMAL LOW (ref 38.4–76.8)
Platelets: 142 10*3/uL — ABNORMAL LOW (ref 145–400)
RBC: 3.69 10*6/uL — AB (ref 3.70–5.45)
RDW: 15.5 % — ABNORMAL HIGH (ref 11.2–14.5)
WBC: 8.7 10*3/uL (ref 3.9–10.3)
lymph#: 4.7 10*3/uL — ABNORMAL HIGH (ref 0.9–3.3)

## 2013-10-25 NOTE — Telephone Encounter (Signed)
per pof to sch pt appt-gave pt copy of sch °

## 2013-10-27 NOTE — Progress Notes (Signed)
Audrey Spencer FOLLOW-UP progress notes  Patient Care Team: Biagio Borg, MD as PCP - General  CHIEF COMPLAINTS/PURPOSE OF VISIT:  History of non-Hodgkin lymphoma  HISTORY OF PRESENTING ILLNESS:  Audrey Spencer 80 y.o. female was transferred to my care after her prior physician has left.  I reviewed the patient's records extensive and collaborated the history with the patient. Summary of her history is as follows: She was initially diagnosed with a low-grade B-cell non-Hodgkin's lymphoma in July 1993. She was initially treated with single agent cladribine achieving a durable remission lasting over 10 years. Disease transformed to chronic lymphocytic leukemia in 2010. She was treated with bendamustine/Rituxan for 5 cycles between 08/18/2008 and 12/10/2008 achieving a complete hematologic and radiographic response.   She was diagnosed with a second primary stage I ER positive cancer of the left breast in April 1998 treated with lumpectomy, radiation and tamoxifen. Mammogram 08/06/2012 showed no evidence of malignancy.  She denies any recent fever, chills, night sweats or abnormal weight loss She denies any recent abnormal breast examination, palpable mass, abnormal breast appearance or nipple changes The patient has progressive memory loss.  MEDICAL HISTORY:  Past Medical History  Diagnosis Date  . Dementia 01/25/2011  . CLL 09/22/2009  . Personal history of malignant neoplasm of breast 09/22/2009  . ANEMIA-IRON DEFICIENCY 04/09/2007  . Bronchiectasis without acute exacerbation 01/07/2009  . GERD 02/26/2008  . HYPERLIPIDEMIA 04/09/2007  . OBSTRUCTIVE SLEEP APNEA 04/22/2008  . Pancytopenia 03/19/2009  . Recurrent UTI 01/25/2011  . DIVERTICULOSIS, COLON 04/15/2010  . GLAUCOMA 04/09/2007  . 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 OSTEOPOROSIS 03/19/2009  . RECTOCELE WITHOUT MENTION OF UTERINE PROLAPSE 04/15/2010  . Depression 05/03/2012  . Stage I breast cancer 06/29/2012   Left breast stage 1 dx 08/1996;  ER positive Rx lumpectomy/RT/Tamoxifen    SURGICAL HISTORY: Past Surgical History  Procedure Laterality Date  . Breast surgery      lumpectomy  . Abdominal hysterectomy    . Rotater cuff      SOCIAL HISTORY: History   Social History  . Marital Status: Widowed    Spouse Name: N/A    Number of Children: N/A  . Years of Education: N/A   Occupational History  . Not on file.   Social History Main Topics  . Smoking status: Never Smoker   . Smokeless tobacco: Never Used  . Alcohol Use: No  . Drug Use: No  . Sexual Activity: Not on file   Other Topics Concern  . Not on file   Social History Narrative  . No narrative on file    FAMILY HISTORY: Family History  Problem Relation Age of Onset  . Hypertension Father   . Cancer Sister   . Cancer Brother     ALLERGIES:  is allergic to pseudoephedrine; codeine; doxycycline; nsaids; and solifenacin succinate.  MEDICATIONS:  Current Outpatient Prescriptions  Medication Sig Dispense Refill  . aspirin 325 MG tablet Take 325 mg by mouth daily.      . AZOPT 1 % ophthalmic suspension Place 1 drop into the right eye Twice daily.      . Calcium Carbonate-Vitamin D (CALCIUM 600+D) 600-400 MG-UNIT per tablet Take 1 tablet by mouth daily.      . citalopram (CELEXA) 10 MG tablet Take 10 mg by mouth daily.      . COMBIGAN 0.2-0.5 % ophthalmic solution Place 1 drop into both eyes 2 (two) times daily.       Marland Kitchen donepezil (ARICEPT)  5 MG tablet Take 1 tablet (5 mg total) by mouth at bedtime.  90 tablet  3  . ferrous sulfate 325 (65 FE) MG tablet Take 325 mg by mouth daily with breakfast.      . latanoprost (XALATAN) 0.005 % ophthalmic solution Place into the left eye at bedtime.      . memantine (NAMENDA) 5 MG tablet Take 1 tablet (5 mg total) by mouth 2 (two) times daily.  60 tablet  11  . Multiple Vitamin (MULTIVITAMIN WITH MINERALS) TABS tablet Take 1 tablet by mouth daily.      Marland Kitchen omeprazole (PRILOSEC) 20 MG  capsule Take 20 mg by mouth daily.      . vitamin C (ASCORBIC ACID) 500 MG tablet Take 500 mg by mouth daily.       No current facility-administered medications for this visit.    REVIEW OF SYSTEMS:   Constitutional: Denies fevers, chills or abnormal night sweats Eyes: Denies blurriness of vision, double vision or watery eyes Ears, nose, mouth, throat, and face: Denies mucositis or sore throat Respiratory: Denies cough, dyspnea or wheezes Cardiovascular: Denies palpitation, chest discomfort or lower extremity swelling Gastrointestinal:  Denies nausea, heartburn or change in bowel habits Skin: Denies abnormal skin rashes Lymphatics: Denies new lymphadenopathy or easy bruising Neurological:Denies numbness, tingling or new weaknesses Behavioral/Psych: Mood is stable, no new changes  All other systems were reviewed with the patient and are negative.  PHYSICAL EXAMINATION: ECOG PERFORMANCE STATUS: 1 - Symptomatic but completely ambulatory  Filed Vitals:   10/25/13 1307  BP: 141/48  Pulse: 80  Temp: 97.5 F (36.4 C)  Resp: 18   Filed Weights   10/25/13 1307  Weight: 155 lb 8 oz (70.534 kg)    GENERAL:alert, no distress and comfortable SKIN: skin color, texture, turgor are normal, no rashes or significant lesions EYES: normal, conjunctiva are pink and non-injected, sclera clear OROPHARYNX:no exudate, normal lips, buccal mucosa, and tongue  NECK: supple, thyroid normal size, non-tender, without nodularity LYMPH:  no palpable lymphadenopathy in the cervical, axillary or inguinal LUNGS: clear to auscultation and percussion with normal breathing effort HEART: regular rate & rhythm and no murmurs without lower extremity edema ABDOMEN:abdomen soft, non-tender and normal bowel sounds Musculoskeletal:no cyanosis of digits and no clubbing  PSYCH: alert & oriented x 3 with fluent speech NEURO: no focal motor/sensory deficits  LABORATORY DATA:  I have reviewed the data as listed Lab  Results  Component Value Date   WBC 8.7 10/25/2013   HGB 11.9 10/25/2013   HCT 36.4 10/25/2013   MCV 98.7 10/25/2013   PLT 142* 10/25/2013    Recent Labs  07/31/13 1230 08/01/13 0340 08/03/13 0350 10/11/13 1554 10/25/13 1251  NA 139 140 139 136 141  K 3.8 3.9 3.7 4.1 4.3  CL 95* 100 98 96  --   CO2 26 27 34* 33* 27  GLUCOSE 162* 161* 127* 93 103  BUN 16 20 12 15  15.8  CREATININE 0.54 0.58 0.49* 0.7 0.7  CALCIUM 9.1 8.7 8.6 9.0 8.8  GFRNONAA 82* 80* 84*  --   --   GFRAA >90 >90 >90  --   --   PROT 6.8 6.1  --  6.4 6.0*  ALBUMIN 3.4* 2.9*  --  4.1 3.6  AST 20 26  --  25 20  ALT 11 17  --  14 13  ALKPHOS 77 65  --  59 68  BILITOT 0.8 0.4  --  0.6 0.38  BILIDIR  --   --   --  0.1  --    ASSESSMENT & PLAN:  #1 history of non-Hodgkin lymphoma, transformed to a CLL, status post chemotherapy CBC and peripheral blood smear confirms relapse of CLL with lymphocytosis. She has very mild anemia and thrombocytopenia but not enough to attribute that to CLL. I explained to the family members that her CLL is relapsed but at present time she has no signs and symptoms to warrant further investigations such as CT scan of bone marrow biopsy. I would continue close followup with history, physical examination and blood. #2 left breast cancer stage I status post lumpectomy, radiation and tamoxifen Recent mammogram is negative for disease recurrence. Continue history and physical examination. At her age, I would not recommend yearly mammogram followup. #3 progressive memory loss The patient is currently on treatment for dementia. Orders Placed This Encounter  Procedures  . CBC with Differential    Standing Status: Future     Number of Occurrences:      Standing Expiration Date: 10/25/2014  . Comprehensive metabolic panel    Standing Status: Future     Number of Occurrences:      Standing Expiration Date: 10/25/2014  . Lactate dehydrogenase    Standing Status: Future     Number of  Occurrences:      Standing Expiration Date: 10/25/2014  . Ferritin    Standing Status: Future     Number of Occurrences:      Standing Expiration Date: 10/25/2014  . IgG, IgA, IgM    Standing Status: Future     Number of Occurrences:      Standing Expiration Date: 10/25/2014    All questions were answered. The patient knows to call the clinic with any problems, questions or concerns. I spent 25 minutes counseling the patient face to face. The total time spent in the appointment was 30 minutes and more than 50% was on counseling.     Heath Lark, MD 10/27/2013 1:50 PM

## 2013-10-28 LAB — FLOW CYTOMETRY

## 2014-01-09 ENCOUNTER — Telehealth: Payer: Self-pay | Admitting: *Deleted

## 2014-01-09 NOTE — Telephone Encounter (Signed)
If possible, we should try to see her tomorrow in office  - can she come in to be worked in (anytime)?

## 2014-01-09 NOTE — Telephone Encounter (Signed)
Daughter called and stated mom is having a lot of urine frequency, with burning & pressure. Wanting to see will md send something in for UTI.Audrey KitchenJohny Chess

## 2014-01-10 ENCOUNTER — Encounter: Payer: Self-pay | Admitting: Internal Medicine

## 2014-01-10 ENCOUNTER — Ambulatory Visit (INDEPENDENT_AMBULATORY_CARE_PROVIDER_SITE_OTHER): Payer: Medicare Other | Admitting: Internal Medicine

## 2014-01-10 ENCOUNTER — Other Ambulatory Visit: Payer: Medicare Other

## 2014-01-10 VITALS — BP 130/50 | HR 94 | Temp 97.5°F | Ht 63.0 in | Wt 149.4 lb

## 2014-01-10 DIAGNOSIS — N3000 Acute cystitis without hematuria: Secondary | ICD-10-CM

## 2014-01-10 DIAGNOSIS — N390 Urinary tract infection, site not specified: Secondary | ICD-10-CM | POA: Insufficient documentation

## 2014-01-10 DIAGNOSIS — R300 Dysuria: Secondary | ICD-10-CM

## 2014-01-10 LAB — POCT URINALYSIS DIPSTICK
Bilirubin, UA: NEGATIVE
GLUCOSE UA: NEGATIVE
Nitrite, UA: POSITIVE
Protein, UA: 300
SPEC GRAV UA: 1.02
UROBILINOGEN UA: 0.2
pH, UA: 5

## 2014-01-10 MED ORDER — CEPHALEXIN 500 MG PO CAPS: 500.0000 mg | ORAL_CAPSULE | Freq: Four times a day (QID) | ORAL | Status: DC

## 2014-01-10 MED ORDER — CEFTRIAXONE SODIUM 1 G IJ SOLR
1.0000 g | Freq: Once | INTRAMUSCULAR | Status: AC
  Administered 2014-01-10: 1 g via INTRAMUSCULAR

## 2014-01-10 NOTE — Progress Notes (Signed)
Pre visit review using our clinic review tool, if applicable. No additional management support is needed unless otherwise documented below in the visit note. 

## 2014-01-10 NOTE — Patient Instructions (Signed)
You had the antibiotic shot today (rocephin)  Please take all new medication as prescribed - the pill antibiotic (cephalexin)  Please continue all other medications as before, and refills have been done if requested.  Please have the pharmacy call with any other refills you may need.  Please keep your appointments with your specialists as you may have planned

## 2014-01-10 NOTE — Telephone Encounter (Signed)
I spoke w/ linda and she wanted to know if she could bring Audrey Spencer's urine sample in, she stated that Kalle is not feeling well and it is too much of an ordeal to bring her into clinic.  Please advise

## 2014-01-10 NOTE — Progress Notes (Signed)
Subjective:    Patient ID: Audrey Spencer, female    DOB: Sep 13, 1922, 80 y.o.   MRN: [id number]  HPI  Here with acute visit, hx of dementia but able to give adequate hx, c/o 3 days onset dysuria, frequency, urgency, but no flank pain, hematuria or n/v, fever, chills.  Similar to previous episodes per pt, cannot recall last UTI.   Past Medical History  Diagnosis Date  . Dementia 01/25/2011  . CLL 09/22/2009  . Personal history of malignant neoplasm of breast 09/22/2009  . ANEMIA-IRON DEFICIENCY 04/09/2007  . Bronchiectasis without acute exacerbation 01/07/2009  . GERD 02/26/2008  . HYPERLIPIDEMIA 04/09/2007  . OBSTRUCTIVE SLEEP APNEA 04/22/2008  . Pancytopenia 03/19/2009  . Recurrent UTI 01/25/2011  . DIVERTICULOSIS, COLON 04/15/2010  . GLAUCOMA 04/09/2007  . 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 OSTEOPOROSIS 03/19/2009  . RECTOCELE WITHOUT MENTION OF UTERINE PROLAPSE 04/15/2010  . Depression 05/03/2012  . Stage I breast cancer 06/29/2012    Left breast stage 1 dx 08/1996;  ER positive Rx lumpectomy/RT/Tamoxifen   Past Surgical History  Procedure Laterality Date  . Breast surgery      lumpectomy  . Abdominal hysterectomy    . Rotater cuff      reports that she has never smoked. She has never used smokeless tobacco. She reports that she does not drink alcohol or use illicit drugs. family history includes Cancer in her brother and sister; Hypertension in her father. Allergies  Allergen Reactions  . Pseudoephedrine Other (See Comments)    insomnia  . Codeine     REACTION: nausea (mild)  . Doxycycline     REACTION: Nausea  . Nsaids     REACTION: GI bleed/anemia  . Solifenacin Succinate     REACTION: blurred vision   Current Outpatient Prescriptions on File Prior to Visit  Medication Sig Dispense Refill  . aspirin 325 MG tablet Take 325 mg by mouth daily.      . AZOPT 1 % ophthalmic suspension Place 1 drop into the right eye Twice daily.      . Calcium Carbonate-Vitamin D  (CALCIUM 600+D) 600-400 MG-UNIT per tablet Take 1 tablet by mouth daily.      . citalopram (CELEXA) 10 MG tablet Take 10 mg by mouth daily.      . COMBIGAN 0.2-0.5 % ophthalmic solution Place 1 drop into both eyes 2 (two) times daily.       Marland Kitchen donepezil (ARICEPT) 5 MG tablet Take 1 tablet (5 mg total) by mouth at bedtime.  90 tablet  3  . ferrous sulfate 325 (65 FE) MG tablet Take 325 mg by mouth daily with breakfast.      . latanoprost (XALATAN) 0.005 % ophthalmic solution Place into the left eye at bedtime.      . memantine (NAMENDA) 5 MG tablet Take 1 tablet (5 mg total) by mouth 2 (two) times daily.  60 tablet  11  . Multiple Vitamin (MULTIVITAMIN WITH MINERALS) TABS tablet Take 1 tablet by mouth daily.      Marland Kitchen omeprazole (PRILOSEC) 20 MG capsule Take 20 mg by mouth daily.      . vitamin C (ASCORBIC ACID) 500 MG tablet Take 500 mg by mouth daily.       No current facility-administered medications on file prior to visit.   Review of Systems All otherwise neg per pt     Objective:   Physical Exam BP 130/50  Pulse 94  Temp(Src) 97.5 F (36.4 C) (Oral)  Ht 5\' 3"  (1.6 m)  Wt 149 lb 6.4 oz (67.767 kg)  BMI 26.47 kg/m2  SpO2 94% VS noted,  Constitutional: Pt appears well-developed, well-nourished.  HENT: Head: NCAT.  Right Ear: External ear normal.  Left Ear: External ear normal.  Eyes: . Pupils are equal, round, and reactive to light. Conjunctivae and EOM are normal Neck: Normal range of motion. Neck supple.  Cardiovascular: Normal rate and regular Audrey.   Pulmonary/Chest: Effort normal and breath sounds normal.  Abd:  Soft, ND, + BS with low mid abd tender, no guarding or rebound, no flank tender bilat Neurological: Pt is alert. Not confused , motor grossly intact Skin: Skin is warm. No rash Psychiatric: Pt behavior is normal. No agitation.     Assessment & Plan:

## 2014-01-10 NOTE — Telephone Encounter (Signed)
Seen in office today  

## 2014-01-11 NOTE — Assessment & Plan Note (Addendum)
Mild to mod, for antibx course after rocephin IM today,  to f/u any worsening symptoms or concerns, for urine cx as well

## 2014-01-14 ENCOUNTER — Other Ambulatory Visit: Payer: Self-pay | Admitting: Internal Medicine

## 2014-01-14 LAB — URINE CULTURE

## 2014-01-14 MED ORDER — CIPROFLOXACIN HCL 250 MG PO TABS: 250.0000 mg | ORAL_TABLET | Freq: Two times a day (BID) | ORAL | Status: DC

## 2014-01-18 ENCOUNTER — Other Ambulatory Visit: Payer: Self-pay | Admitting: Nurse Practitioner

## 2014-01-18 DIAGNOSIS — C50919 Malignant neoplasm of unspecified site of unspecified female breast: Secondary | ICD-10-CM

## 2014-02-05 ENCOUNTER — Telehealth: Payer: Self-pay | Admitting: Internal Medicine

## 2014-02-05 NOTE — Telephone Encounter (Signed)
DPR is designated party release.

## 2014-02-05 NOTE — Telephone Encounter (Signed)
As her infection last visit was with 2 organisms (complicated) she will need ROV, and remember sat clinic if not able to come in sooner

## 2014-02-05 NOTE — Telephone Encounter (Signed)
Please clarify "DPR listing" as I am not sure what this means

## 2014-02-05 NOTE — Telephone Encounter (Signed)
Patients niece Sheppard Evens [phone])  called in regards to [place].  Could not find a DPR listing her.  Niece states that patient has same symptoms she had from last visit on 8/14.  Would like to know if patient needs to come in or she needs another med called in.

## 2014-02-06 ENCOUNTER — Other Ambulatory Visit: Payer: Medicare Other

## 2014-02-06 ENCOUNTER — Encounter: Payer: Self-pay | Admitting: Internal Medicine

## 2014-02-06 ENCOUNTER — Ambulatory Visit (INDEPENDENT_AMBULATORY_CARE_PROVIDER_SITE_OTHER): Payer: Medicare Other | Admitting: Internal Medicine

## 2014-02-06 VITALS — BP 132/82 | HR 79 | Temp 97.7°F | Wt 150.5 lb

## 2014-02-06 DIAGNOSIS — R0989 Other specified symptoms and signs involving the circulatory and respiratory systems: Secondary | ICD-10-CM

## 2014-02-06 DIAGNOSIS — N390 Urinary tract infection, site not specified: Secondary | ICD-10-CM

## 2014-02-06 DIAGNOSIS — R0689 Other abnormalities of breathing: Secondary | ICD-10-CM | POA: Insufficient documentation

## 2014-02-06 DIAGNOSIS — R32 Unspecified urinary incontinence: Secondary | ICD-10-CM | POA: Insufficient documentation

## 2014-02-06 DIAGNOSIS — F0390 Unspecified dementia without behavioral disturbance: Secondary | ICD-10-CM

## 2014-02-06 LAB — POCT URINALYSIS DIPSTICK
Bilirubin, UA: NEGATIVE
Glucose, UA: NEGATIVE
KETONES UA: NEGATIVE
NITRITE UA: NEGATIVE
PROTEIN UA: NEGATIVE
Spec Grav, UA: 1.025
Urobilinogen, UA: 4
pH, UA: 6

## 2014-02-06 MED ORDER — CEFTRIAXONE SODIUM 1 G IJ SOLR
1.0000 g | Freq: Once | INTRAMUSCULAR | Status: AC
  Administered 2014-02-06: 1 g via INTRAMUSCULAR

## 2014-02-06 MED ORDER — CEFUROXIME AXETIL 250 MG PO TABS: 250.0000 mg | ORAL_TABLET | Freq: Two times a day (BID) | ORAL | Status: DC

## 2014-02-06 NOTE — Progress Notes (Signed)
Subjective:    Patient ID: Audrey Spencer, female    DOB: 1922/09/05, 80 y.o.   MRN: [id number]  HPI  Here to f/u with family who helps with hx; c/o 3 days onset dysuria, frequency, urgency, but no flank pain, hematuria or n/v, fever, chills. Just finished antibx course for previous. Has ongoing urinary incontinence, ? Worse recent, not seen per urology recently.   Has ? Mild worsening confusion. Pt denies chest pain, increased sob or doe, wheezing, orthopnea, PND, increased LE swelling, palpitations, dizziness or syncope.  Pt denies new neurological symptoms such as new headache, or facial or extremity weakness or numbness   Past Medical History  Diagnosis Date  . Dementia 01/25/2011  . CLL 09/22/2009  . Personal history of malignant neoplasm of breast 09/22/2009  . ANEMIA-IRON DEFICIENCY 04/09/2007  . Bronchiectasis without acute exacerbation 01/07/2009  . GERD 02/26/2008  . HYPERLIPIDEMIA 04/09/2007  . OBSTRUCTIVE SLEEP APNEA 04/22/2008  . Pancytopenia 03/19/2009  . Recurrent UTI 01/25/2011  . DIVERTICULOSIS, COLON 04/15/2010  . GLAUCOMA 04/09/2007  . 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 OSTEOPOROSIS 03/19/2009  . RECTOCELE WITHOUT MENTION OF UTERINE PROLAPSE 04/15/2010  . Depression 05/03/2012  . Stage I breast cancer 06/29/2012    Left breast stage 1 dx 08/1996;  ER positive Rx lumpectomy/RT/Tamoxifen   Past Surgical History  Procedure Laterality Date  . Breast surgery      lumpectomy  . Abdominal hysterectomy    . Rotater cuff      reports that she has never smoked. She has never used smokeless tobacco. She reports that she does not drink alcohol or use illicit drugs. family history includes Cancer in her brother and sister; Hypertension in her father. Allergies  Allergen Reactions  . Pseudoephedrine Other (See Comments)    insomnia  . Codeine     REACTION: nausea (mild)  . Doxycycline     REACTION: Nausea  . Nsaids     REACTION: GI bleed/anemia  . Solifenacin  Succinate     REACTION: blurred vision   Current Outpatient Prescriptions on File Prior to Visit  Medication Sig Dispense Refill  . aspirin 325 MG tablet Take 325 mg by mouth daily.      . AZOPT 1 % ophthalmic suspension Place 1 drop into the right eye Twice daily.      . Calcium Carbonate-Vitamin D (CALCIUM 600+D) 600-400 MG-UNIT per tablet Take 1 tablet by mouth daily.      . citalopram (CELEXA) 10 MG tablet Take 10 mg by mouth daily.      . COMBIGAN 0.2-0.5 % ophthalmic solution Place 1 drop into both eyes 2 (two) times daily.       Marland Kitchen donepezil (ARICEPT) 5 MG tablet Take 1 tablet (5 mg total) by mouth at bedtime.  90 tablet  3  . latanoprost (XALATAN) 0.005 % ophthalmic solution Place into the left eye at bedtime.      . memantine (NAMENDA) 5 MG tablet Take 1 tablet (5 mg total) by mouth 2 (two) times daily.  60 tablet  11  . Multiple Vitamin (MULTIVITAMIN WITH MINERALS) TABS tablet Take 1 tablet by mouth daily.      Marland Kitchen omeprazole (PRILOSEC) 20 MG capsule TAKE ONE CAPSULE BY MOUTH EVERY DAY  60 capsule  0   No current facility-administered medications on file prior to visit.   Review of Systems  Constitutional: Negative for unusual diaphoresis or other sweats  HENT: Negative for ringing in ear Eyes: Negative for double  vision or worsening visual disturbance.  Respiratory: Negative for choking and stridor.   Gastrointestinal: Negative for vomiting or other signifcant bowel change Genitourinary: Negative for hematuria or decreased urine volume.  Musculoskeletal: Negative for other MSK pain or swelling Skin: Negative for color change and worsening wound.  Neurological: Negative for tremors and numbness other than noted  Psychiatric/Behavioral: Negative for decreased concentration or agitation other than above       Objective:   Physical Exam BP 132/82  Pulse 79  Temp(Src) 97.7 F (36.5 C) (Oral)  Wt 150 lb 8 oz (68.266 kg)  SpO2 92% VS noted,  Constitutional: Pt appears  well-developed, well-nourished.  HENT: Head: NCAT.  Right Ear: External ear normal.  Left Ear: External ear normal.  Eyes: . Pupils are equal, round, and reactive to light. Conjunctivae and EOM are normal Neck: Normal range of motion. Neck supple.  Cardiovascular: Normal rate and regular rhythm.   Pulmonary/Chest: Effort normal and breath sounds decreased, few bibas RLL crackles .  Abd:  Soft, ND, + BS, mild low mid abd tender, no flank tender Neurological: Pt is alert. +confused , motor grossly intact Skin: Skin is warm. No rash Psychiatric: Pt behavior is normal. No agitation.     Assessment & Plan:

## 2014-02-06 NOTE — Telephone Encounter (Signed)
Spoke to the aunt and informed of MD instructions.  Scheduled ROV today 02/06/14 at 4:15 with PCP.

## 2014-02-06 NOTE — Patient Instructions (Addendum)
You had the antibiotic shot today (rocephin)  Please take all new medication as prescribed - the generic ceftin (antibiotic)  Your specimen will be sent for culture  You will be contacted regarding the referral for: Dr Tannenbaum/urology  Please continue all other medications as before, and refills have been done if requested.  Please have the pharmacy call with any other refills you may need.  Please keep your appointments with your specialists as you may have planned

## 2014-02-06 NOTE — Assessment & Plan Note (Signed)
For referral to Dr Gaynelle Arabian, if can be improved may have less recurrent UTI?

## 2014-02-06 NOTE — Progress Notes (Signed)
Pre visit review using our clinic review tool, if applicable. No additional management support is needed unless otherwise documented below in the visit note. 

## 2014-02-06 NOTE — Assessment & Plan Note (Signed)
Recurrent, last uti 1 mo ago complicated ecoli/enterobacter; udip reveiwed - c/w prob recurrence, exam consistent as well, for rocephin IM today, urine cx, and antibx course

## 2014-02-08 LAB — URINE CULTURE: Colony Count: 15000

## 2014-02-09 NOTE — Assessment & Plan Note (Addendum)
Afeb, doubt volume overload, pt with recent ct mar 2015 with fibrotic changes,  to f/u any worsening symptoms or concerns

## 2014-02-09 NOTE — Assessment & Plan Note (Signed)
stable overall by history and exam, and pt to continue medical treatment as before,  to f/u any worsening symptoms or concerns 

## 2014-02-28 ENCOUNTER — Ambulatory Visit (HOSPITAL_BASED_OUTPATIENT_CLINIC_OR_DEPARTMENT_OTHER): Payer: Medicare Other | Admitting: Hematology and Oncology

## 2014-02-28 ENCOUNTER — Encounter: Payer: Self-pay | Admitting: Hematology and Oncology

## 2014-02-28 ENCOUNTER — Telehealth: Payer: Self-pay | Admitting: Hematology and Oncology

## 2014-02-28 ENCOUNTER — Other Ambulatory Visit (HOSPITAL_BASED_OUTPATIENT_CLINIC_OR_DEPARTMENT_OTHER): Payer: Medicare Other

## 2014-02-28 VITALS — BP 150/37 | HR 70 | Temp 97.5°F | Resp 18 | Ht 63.0 in | Wt 149.2 lb

## 2014-02-28 DIAGNOSIS — F0390 Unspecified dementia without behavioral disturbance: Secondary | ICD-10-CM

## 2014-02-28 DIAGNOSIS — C50912 Malignant neoplasm of unspecified site of left female breast: Secondary | ICD-10-CM

## 2014-02-28 DIAGNOSIS — C9110 Chronic lymphocytic leukemia of B-cell type not having achieved remission: Secondary | ICD-10-CM

## 2014-02-28 DIAGNOSIS — Z853 Personal history of malignant neoplasm of breast: Secondary | ICD-10-CM

## 2014-02-28 DIAGNOSIS — C9112 Chronic lymphocytic leukemia of B-cell type in relapse: Secondary | ICD-10-CM

## 2014-02-28 DIAGNOSIS — D696 Thrombocytopenia, unspecified: Secondary | ICD-10-CM

## 2014-02-28 LAB — CBC WITH DIFFERENTIAL/PLATELET
BASO%: 0.2 % (ref 0.0–2.0)
BASOS ABS: 0 10*3/uL (ref 0.0–0.1)
EOS%: 2.1 % (ref 0.0–7.0)
Eosinophils Absolute: 0.3 10*3/uL (ref 0.0–0.5)
HEMATOCRIT: 36.9 % (ref 34.8–46.6)
HGB: 11.6 g/dL (ref 11.6–15.9)
LYMPH#: 9.2 10*3/uL — AB (ref 0.9–3.3)
LYMPH%: 69.8 % — AB (ref 14.0–49.7)
MCH: 30.4 pg (ref 25.1–34.0)
MCHC: 31.4 g/dL — ABNORMAL LOW (ref 31.5–36.0)
MCV: 96.9 fL (ref 79.5–101.0)
MONO#: 0.7 10*3/uL (ref 0.1–0.9)
MONO%: 5.3 % (ref 0.0–14.0)
NEUT#: 3 10*3/uL (ref 1.5–6.5)
NEUT%: 22.6 % — AB (ref 38.4–76.8)
Platelets: 116 10*3/uL — ABNORMAL LOW (ref 145–400)
RBC: 3.81 10*6/uL (ref 3.70–5.45)
RDW: 15.3 % — ABNORMAL HIGH (ref 11.2–14.5)
WBC: 13.2 10*3/uL — ABNORMAL HIGH (ref 3.9–10.3)

## 2014-02-28 LAB — COMPREHENSIVE METABOLIC PANEL (CC13)
ALT: 14 U/L (ref 0–55)
AST: 22 U/L (ref 5–34)
Albumin: 3.7 g/dL (ref 3.5–5.0)
Alkaline Phosphatase: 72 U/L (ref 40–150)
Anion Gap: 7 mEq/L (ref 3–11)
BILIRUBIN TOTAL: 0.35 mg/dL (ref 0.20–1.20)
BUN: 13.9 mg/dL (ref 7.0–26.0)
CALCIUM: 9.5 mg/dL (ref 8.4–10.4)
CHLORIDE: 102 meq/L (ref 98–109)
CO2: 33 mEq/L — ABNORMAL HIGH (ref 22–29)
CREATININE: 0.7 mg/dL (ref 0.6–1.1)
Glucose: 64 mg/dl — ABNORMAL LOW (ref 70–140)
Potassium: 3.8 mEq/L (ref 3.5–5.1)
Sodium: 142 mEq/L (ref 136–145)
Total Protein: 6.5 g/dL (ref 6.4–8.3)

## 2014-02-28 LAB — FERRITIN CHCC: FERRITIN: 25 ng/mL (ref 9–269)

## 2014-02-28 LAB — TECHNOLOGIST REVIEW

## 2014-02-28 LAB — LACTATE DEHYDROGENASE (CC13): LDH: 209 U/L (ref 125–245)

## 2014-02-28 NOTE — Telephone Encounter (Signed)
gv adn pritned aptp sched adn avs for pt for April 2016

## 2014-02-28 NOTE — Assessment & Plan Note (Signed)
She is currently on medication for this.

## 2014-02-28 NOTE — Progress Notes (Signed)
[place] OFFICE PROGRESS NOTE  Patient Care Team: Biagio Borg, MD as PCP - General  SUMMARY OF ONCOLOGIC HISTORY: She was initially diagnosed with a low-grade B-cell non-Hodgkin's lymphoma in July 1993. She was initially treated with single agent cladribine achieving a durable remission lasting over 10 years. Disease transformed to chronic lymphocytic leukemia in 2010. She was treated with bendamustine/Rituxan for 5 cycles between 08/18/2008 and 12/10/2008 achieving a complete hematologic and radiographic response.   She was diagnosed with a second primary stage I ER positive cancer of the left breast in April 1998 treated with lumpectomy, radiation and tamoxifen. Mammogram 08/06/2012 and March 2015 showed no evidence of malignancy.   INTERVAL HISTORY: Please see below for problem oriented charting. She denies any recent abnormal breast examination, palpable mass, abnormal breast appearance or nipple changes She has progressive decline in her memory. She have recurrent urinary tract infection recently.  REVIEW OF SYSTEMS:   Constitutional: Denies fevers, chills or abnormal weight loss Eyes: Denies blurriness of vision Ears, nose, mouth, throat, and face: Denies mucositis or sore throat Respiratory: Denies cough, dyspnea or wheezes Cardiovascular: Denies palpitation, chest discomfort or lower extremity swelling Gastrointestinal:  Denies nausea, heartburn or change in bowel habits Skin: Denies abnormal skin rashes Lymphatics: Denies new lymphadenopathy or easy bruising Neurological:Denies numbness, tingling or new weaknesses Behavioral/Psych: Mood is stable, no new changes  All other systems were reviewed with the patient and are negative.  I have reviewed the past medical history, past surgical history, social history and family history with the patient and they are unchanged from previous note.  ALLERGIES:  is allergic to pseudoephedrine; codeine; doxycycline;  nsaids; and solifenacin succinate.  MEDICATIONS:  Current Outpatient Prescriptions  Medication Sig Dispense Refill  . aspirin 325 MG tablet Take 325 mg by mouth daily.      . AZOPT 1 % ophthalmic suspension Place 1 drop into the right eye Twice daily.      . Calcium Carbonate-Vitamin D (CALCIUM 600+D) 600-400 MG-UNIT per tablet Take 1 tablet by mouth daily.      . citalopram (CELEXA) 10 MG tablet Take 10 mg by mouth daily.      . COMBIGAN 0.2-0.5 % ophthalmic solution Place 1 drop into both eyes 2 (two) times daily.       Marland Kitchen donepezil (ARICEPT) 5 MG tablet Take 1 tablet (5 mg total) by mouth at bedtime.  90 tablet  3  . latanoprost (XALATAN) 0.005 % ophthalmic solution Place into the left eye at bedtime.      . memantine (NAMENDA) 5 MG tablet Take 1 tablet (5 mg total) by mouth 2 (two) times daily.  60 tablet  11  . Multiple Vitamin (MULTIVITAMIN WITH MINERALS) TABS tablet Take 1 tablet by mouth daily.      Marland Kitchen omeprazole (PRILOSEC) 20 MG capsule TAKE ONE CAPSULE BY MOUTH EVERY DAY  60 capsule  0  . cefUROXime (CEFTIN) 250 MG tablet Take 1 tablet (250 mg total) by mouth 2 (two) times daily.  20 tablet  0   No current facility-administered medications for this visit.    PHYSICAL EXAMINATION: ECOG PERFORMANCE STATUS: 1 - Symptomatic but completely ambulatory  Filed Vitals:   02/28/14 1330  BP: 150/37  Pulse: 70  Temp: 97.5 F (36.4 C)  Resp: 18   Filed Weights   02/28/14 1330  Weight: 149 lb 3.2 oz (67.677 kg)    GENERAL:alert, no distress and comfortable SKIN: skin color, texture, turgor are normal, no  rashes or significant lesions EYES: normal, Conjunctiva are pink and non-injected, sclera clear OROPHARYNX:no exudate, no erythema and lips, buccal mucosa, and tongue normal  NECK: supple, thyroid normal size, non-tender, without nodularity LYMPH:  no palpable lymphadenopathy in the cervical, axillary or inguinal LUNGS: clear to auscultation and percussion with normal breathing  effort HEART: regular rate & rhythm and no murmurs and no lower extremity edema ABDOMEN:abdomen soft, non-tender and normal bowel sounds Musculoskeletal:no cyanosis of digits and no clubbing  NEURO: alert & oriented x 3 with fluent speech, no focal motor/sensory deficits  LABORATORY DATA:  I have reviewed the data as listed    Component Value Date/Time   NA 141 10/25/2013 1251   NA 136 10/11/2013 1554   K 4.3 10/25/2013 1251   K 4.1 10/11/2013 1554   CL 96 10/11/2013 1554   CL 100 06/29/2012 1029   CO2 27 10/25/2013 1251   CO2 33* 10/11/2013 1554   GLUCOSE 103 10/25/2013 1251   GLUCOSE 93 10/11/2013 1554   GLUCOSE 86 06/29/2012 1029   BUN 15.8 10/25/2013 1251   BUN 15 10/11/2013 1554   CREATININE 0.7 10/25/2013 1251   CREATININE 0.7 10/11/2013 1554   CALCIUM 8.8 10/25/2013 1251   CALCIUM 9.0 10/11/2013 1554   CALCIUM 8.9 03/19/2009 2037   PROT 6.0* 10/25/2013 1251   PROT 6.4 10/11/2013 1554   ALBUMIN 3.6 10/25/2013 1251   ALBUMIN 4.1 10/11/2013 1554   AST 20 10/25/2013 1251   AST 25 10/11/2013 1554   ALT 13 10/25/2013 1251   ALT 14 10/11/2013 1554   ALKPHOS 68 10/25/2013 1251   ALKPHOS 59 10/11/2013 1554   BILITOT 0.38 10/25/2013 1251   BILITOT 0.6 10/11/2013 1554   GFRNONAA 84* 08/03/2013 0350   GFRAA >90 08/03/2013 0350    No results found for this basename: SPEP,  UPEP,   kappa and lambda light chains    Lab Results  Component Value Date   WBC 13.2* 02/28/2014   NEUTROABS 3.0 02/28/2014   HGB 11.6 02/28/2014   HCT 36.9 02/28/2014   MCV 96.9 02/28/2014   PLT 116* 02/28/2014      Chemistry      Component Value Date/Time   NA 141 10/25/2013 1251   NA 136 10/11/2013 1554   K 4.3 10/25/2013 1251   K 4.1 10/11/2013 1554   CL 96 10/11/2013 1554   CL 100 06/29/2012 1029   CO2 27 10/25/2013 1251   CO2 33* 10/11/2013 1554   BUN 15.8 10/25/2013 1251   BUN 15 10/11/2013 1554   CREATININE 0.7 10/25/2013 1251   CREATININE 0.7 10/11/2013 1554      Component Value Date/Time   CALCIUM 8.8 10/25/2013 1251    CALCIUM 9.0 10/11/2013 1554   CALCIUM 8.9 03/19/2009 2037   ALKPHOS 68 10/25/2013 1251   ALKPHOS 59 10/11/2013 1554   AST 20 10/25/2013 1251   AST 25 10/11/2013 1554   ALT 13 10/25/2013 1251   ALT 14 10/11/2013 1554   BILITOT 0.38 10/25/2013 1251   BILITOT 0.6 10/11/2013 1554     ASSESSMENT & PLAN:  CLL (chronic lymphocytic leukemia) Clinically, she has disease relapse. Overall, she is not symptomatic. I recommend history, physical examination and blood work only in 6 months.  Stage I breast cancer Clinically, she has no signs of recurrence. I recommend discontinuation of screening mammogram.  Thrombocytopenia This is likely related to relapse of CLL. She is not symptomatic. Recommend observation only.  Dementia She is currently on medication  for this.    Orders Placed This Encounter  Procedures  . CBC with Differential    Standing Status: Future     Number of Occurrences:      Standing Expiration Date: 04/04/2015  . Comprehensive metabolic panel    Standing Status: Future     Number of Occurrences:      Standing Expiration Date: 04/04/2015  . Lactate dehydrogenase    Standing Status: Future     Number of Occurrences:      Standing Expiration Date: 04/04/2015  . Morphology    Standing Status: Future     Number of Occurrences:      Standing Expiration Date: 04/04/2015   All questions were answered. The patient knows to call the clinic with any problems, questions or concerns. No barriers to learning was detected. I spent 25 minutes counseling the patient face to face. The total time spent in the appointment was 30 minutes and more than 50% was on counseling and review of test results     [facility], [place], MD 02/28/2014 1:38 PM

## 2014-02-28 NOTE — Assessment & Plan Note (Addendum)
Clinically, she has disease relapse. Overall, she is not symptomatic. I recommend history, physical examination and blood work only in 6 months.

## 2014-02-28 NOTE — Assessment & Plan Note (Signed)
Clinically, she has no signs of recurrence. I recommend discontinuation of screening mammogram.

## 2014-02-28 NOTE — Assessment & Plan Note (Signed)
This is likely related to relapse of CLL. She is not symptomatic. Recommend observation only.

## 2014-03-01 LAB — IGG, IGA, IGM
IGA: 22 mg/dL — AB (ref 69–380)
IGG (IMMUNOGLOBIN G), SERUM: 561 mg/dL — AB (ref 690–1700)
IgM, Serum: 21 mg/dL — ABNORMAL LOW (ref 52–322)

## 2014-03-14 ENCOUNTER — Other Ambulatory Visit: Payer: Self-pay | Admitting: Hematology and Oncology

## 2014-03-25 ENCOUNTER — Other Ambulatory Visit: Payer: Self-pay | Admitting: Internal Medicine

## 2014-04-16 ENCOUNTER — Telehealth: Payer: Self-pay | Admitting: Internal Medicine

## 2014-04-16 ENCOUNTER — Encounter (HOSPITAL_COMMUNITY): Payer: Self-pay | Admitting: Emergency Medicine

## 2014-04-16 ENCOUNTER — Emergency Department (HOSPITAL_COMMUNITY): Payer: Medicare Other

## 2014-04-16 ENCOUNTER — Inpatient Hospital Stay (HOSPITAL_COMMUNITY)
Admission: EM | Admit: 2014-04-16 | Discharge: 2014-04-22 | DRG: 689 | Disposition: A | Discharge status: Skilled Nursing Facility | Payer: Medicare Other | Attending: Internal Medicine | Admitting: Internal Medicine

## 2014-04-16 DIAGNOSIS — R0689 Other abnormalities of breathing: Secondary | ICD-10-CM

## 2014-04-16 DIAGNOSIS — D696 Thrombocytopenia, unspecified: Secondary | ICD-10-CM | POA: Diagnosis present

## 2014-04-16 DIAGNOSIS — E8770 Fluid overload, unspecified: Secondary | ICD-10-CM | POA: Diagnosis present

## 2014-04-16 DIAGNOSIS — N3289 Other specified disorders of bladder: Secondary | ICD-10-CM | POA: Diagnosis present

## 2014-04-16 DIAGNOSIS — D72829 Elevated white blood cell count, unspecified: Secondary | ICD-10-CM | POA: Diagnosis present

## 2014-04-16 DIAGNOSIS — C9111 Chronic lymphocytic leukemia of B-cell type in remission: Secondary | ICD-10-CM | POA: Diagnosis present

## 2014-04-16 DIAGNOSIS — E876 Hypokalemia: Secondary | ICD-10-CM | POA: Diagnosis present

## 2014-04-16 DIAGNOSIS — R3989 Other symptoms and signs involving the genitourinary system: Secondary | ICD-10-CM | POA: Diagnosis present

## 2014-04-16 DIAGNOSIS — K6289 Other specified diseases of anus and rectum: Secondary | ICD-10-CM | POA: Diagnosis present

## 2014-04-16 DIAGNOSIS — K5900 Constipation, unspecified: Secondary | ICD-10-CM | POA: Diagnosis present

## 2014-04-16 DIAGNOSIS — F0390 Unspecified dementia without behavioral disturbance: Secondary | ICD-10-CM | POA: Diagnosis present

## 2014-04-16 DIAGNOSIS — R0600 Dyspnea, unspecified: Secondary | ICD-10-CM

## 2014-04-16 DIAGNOSIS — G4733 Obstructive sleep apnea (adult) (pediatric): Secondary | ICD-10-CM | POA: Diagnosis present

## 2014-04-16 DIAGNOSIS — G92 Toxic encephalopathy: Secondary | ICD-10-CM | POA: Diagnosis present

## 2014-04-16 DIAGNOSIS — N390 Urinary tract infection, site not specified: Secondary | ICD-10-CM | POA: Diagnosis present

## 2014-04-16 DIAGNOSIS — I517 Cardiomegaly: Secondary | ICD-10-CM | POA: Diagnosis present

## 2014-04-16 DIAGNOSIS — K644 Residual hemorrhoidal skin tags: Secondary | ICD-10-CM | POA: Diagnosis present

## 2014-04-16 DIAGNOSIS — E872 Acidosis: Secondary | ICD-10-CM | POA: Diagnosis present

## 2014-04-16 DIAGNOSIS — Z8572 Personal history of non-Hodgkin lymphomas: Secondary | ICD-10-CM

## 2014-04-16 DIAGNOSIS — R1032 Left lower quadrant pain: Secondary | ICD-10-CM

## 2014-04-16 DIAGNOSIS — F329 Major depressive disorder, single episode, unspecified: Secondary | ICD-10-CM | POA: Diagnosis present

## 2014-04-16 DIAGNOSIS — D62 Acute posthemorrhagic anemia: Secondary | ICD-10-CM | POA: Diagnosis present

## 2014-04-16 DIAGNOSIS — E440 Moderate protein-calorie malnutrition: Secondary | ICD-10-CM | POA: Diagnosis present

## 2014-04-16 DIAGNOSIS — B9689 Other specified bacterial agents as the cause of diseases classified elsewhere: Secondary | ICD-10-CM | POA: Diagnosis present

## 2014-04-16 DIAGNOSIS — R140 Abdominal distension (gaseous): Secondary | ICD-10-CM | POA: Diagnosis present

## 2014-04-16 DIAGNOSIS — Z87440 Personal history of urinary (tract) infections: Secondary | ICD-10-CM

## 2014-04-16 DIAGNOSIS — Z923 Personal history of irradiation: Secondary | ICD-10-CM

## 2014-04-16 DIAGNOSIS — Z6827 Body mass index (BMI) 27.0-27.9, adult: Secondary | ICD-10-CM | POA: Diagnosis not present

## 2014-04-16 DIAGNOSIS — K219 Gastro-esophageal reflux disease without esophagitis: Secondary | ICD-10-CM | POA: Diagnosis present

## 2014-04-16 DIAGNOSIS — Z66 Do not resuscitate: Secondary | ICD-10-CM | POA: Diagnosis present

## 2014-04-16 DIAGNOSIS — Z9119 Patient's noncompliance with other medical treatment and regimen: Secondary | ICD-10-CM | POA: Diagnosis present

## 2014-04-16 DIAGNOSIS — J9601 Acute respiratory failure with hypoxia: Secondary | ICD-10-CM | POA: Diagnosis not present

## 2014-04-16 DIAGNOSIS — D649 Anemia, unspecified: Secondary | ICD-10-CM | POA: Diagnosis present

## 2014-04-16 DIAGNOSIS — T501X5A Adverse effect of loop [high-ceiling] diuretics, initial encounter: Secondary | ICD-10-CM | POA: Diagnosis present

## 2014-04-16 DIAGNOSIS — D638 Anemia in other chronic diseases classified elsewhere: Secondary | ICD-10-CM | POA: Diagnosis present

## 2014-04-16 DIAGNOSIS — C9110 Chronic lymphocytic leukemia of B-cell type not having achieved remission: Secondary | ICD-10-CM | POA: Diagnosis present

## 2014-04-16 DIAGNOSIS — R0602 Shortness of breath: Secondary | ICD-10-CM

## 2014-04-16 DIAGNOSIS — R0902 Hypoxemia: Secondary | ICD-10-CM

## 2014-04-16 DIAGNOSIS — R301 Vesical tenesmus: Secondary | ICD-10-CM | POA: Diagnosis present

## 2014-04-16 LAB — COMPREHENSIVE METABOLIC PANEL
ALT: 20 U/L (ref 0–35)
ANION GAP: 12 (ref 5–15)
AST: 29 U/L (ref 0–37)
Albumin: 4 g/dL (ref 3.5–5.2)
Alkaline Phosphatase: 76 U/L (ref 39–117)
BUN: 14 mg/dL (ref 6–23)
CALCIUM: 9.3 mg/dL (ref 8.4–10.5)
CO2: 28 mEq/L (ref 19–32)
Chloride: 98 mEq/L (ref 96–112)
Creatinine, Ser: 0.5 mg/dL (ref 0.50–1.10)
GFR calc Af Amer: >90 mL/min (ref >90)
GFR calc non Af Amer: 84 mL/min — ABNORMAL LOW (ref >90)
Glucose, Bld: 115 mg/dL — ABNORMAL HIGH (ref 70–99)
Potassium: 4.3 mEq/L (ref 3.7–5.3)
Sodium: 138 mEq/L (ref 137–147)
TOTAL PROTEIN: 6.7 g/dL (ref 6.0–8.3)
Total Bilirubin: 0.4 mg/dL (ref 0.3–1.2)

## 2014-04-16 LAB — BLOOD GAS, ARTERIAL
Acid-Base Excess: 2.9 mmol/L — ABNORMAL HIGH (ref 0.0–2.0)
BICARBONATE: 29.2 meq/L — AB (ref 20.0–24.0)
DRAWN BY: 11249
O2 Content: 3 L/min
O2 SAT: 98.3 %
PATIENT TEMPERATURE: 97.6
TCO2: 27.7 mmol/L (ref 0–100)
pCO2 arterial: 56.1 mmHg — ABNORMAL HIGH (ref 35.0–45.0)
pH, Arterial: 7.333 — ABNORMAL LOW (ref 7.350–7.450)
pO2, Arterial: 127 mmHg — ABNORMAL HIGH (ref 80.0–100.0)

## 2014-04-16 LAB — CBC WITH DIFFERENTIAL/PLATELET
BASOS PCT: 0 % (ref 0–1)
Basophils Absolute: 0 10*3/uL (ref 0.0–0.1)
Eosinophils Absolute: 0.1 10*3/uL (ref 0.0–0.7)
Eosinophils Relative: 1 % (ref 0–5)
HEMATOCRIT: 31.1 % — AB (ref 36.0–46.0)
Hemoglobin: 9.9 g/dL — ABNORMAL LOW (ref 12.0–15.0)
Lymphocytes Relative: 75 % — ABNORMAL HIGH (ref 12–46)
Lymphs Abs: 16.3 10*3/uL — ABNORMAL HIGH (ref 0.7–4.0)
MCH: 30.4 pg (ref 26.0–34.0)
MCHC: 31.8 g/dL (ref 30.0–36.0)
MCV: 95.4 fL (ref 78.0–100.0)
MONO ABS: 0.7 10*3/uL (ref 0.1–1.0)
Monocytes Relative: 3 % (ref 3–12)
Neutro Abs: 4.6 10*3/uL (ref 1.7–7.7)
Neutrophils Relative %: 21 % — ABNORMAL LOW (ref 43–77)
Platelets: 113 10*3/uL — ABNORMAL LOW (ref 150–400)
RBC: 3.26 MIL/uL — ABNORMAL LOW (ref 3.87–5.11)
RDW: 15.9 % — ABNORMAL HIGH (ref 11.5–15.5)
WBC: 21.7 10*3/uL — ABNORMAL HIGH (ref 4.0–10.5)

## 2014-04-16 LAB — URINALYSIS, ROUTINE W REFLEX MICROSCOPIC
Glucose, UA: NEGATIVE mg/dL
Ketones, ur: NEGATIVE mg/dL
NITRITE: POSITIVE — AB
Protein, ur: NEGATIVE mg/dL
Specific Gravity, Urine: 1.023 (ref 1.005–1.030)
Urobilinogen, UA: 0.2 mg/dL (ref 0.0–1.0)
pH: 5.5 (ref 5.0–8.0)

## 2014-04-16 LAB — URINE MICROSCOPIC-ADD ON

## 2014-04-16 LAB — LIPASE, BLOOD: LIPASE: 20 U/L (ref 11–59)

## 2014-04-16 LAB — POC OCCULT BLOOD, ED: Fecal Occult Bld: POSITIVE — AB

## 2014-04-16 LAB — I-STAT CG4 LACTIC ACID, ED: Lactic Acid, Venous: 0.88 mmol/L (ref 0.5–2.2)

## 2014-04-16 MED ORDER — HYDROCORTISONE 2.5 % RE CREA
TOPICAL_CREAM | Freq: Three times a day (TID) | RECTAL | Status: DC
  Administered 2014-04-17 – 2014-04-22 (×15): via RECTAL
  Filled 2014-04-16 (×2): qty 28.35

## 2014-04-16 MED ORDER — ONDANSETRON HCL 4 MG PO TABS: 4.0000 mg | ORAL_TABLET | Freq: Four times a day (QID) | ORAL | Status: DC | PRN

## 2014-04-16 MED ORDER — FENTANYL CITRATE 0.05 MG/ML IJ SOLN
25.0000 ug | Freq: Once | INTRAMUSCULAR | Status: DC
  Filled 2014-04-16: qty 2

## 2014-04-16 MED ORDER — ONDANSETRON HCL 4 MG/2ML IJ SOLN
4.0000 mg | Freq: Once | INTRAMUSCULAR | Status: AC
  Administered 2014-04-16: 4 mg via INTRAVENOUS
  Filled 2014-04-16: qty 2

## 2014-04-16 MED ORDER — IOHEXOL 300 MG/ML  SOLN
100.0000 mL | Freq: Once | INTRAMUSCULAR | Status: AC | PRN
  Administered 2014-04-16: 100 mL via INTRAVENOUS

## 2014-04-16 MED ORDER — ADULT MULTIVITAMIN W/MINERALS CH
1.0000 | ORAL_TABLET | Freq: Every day | ORAL | Status: DC
  Administered 2014-04-17 – 2014-04-22 (×6): 1 via ORAL
  Filled 2014-04-16 (×6): qty 1

## 2014-04-16 MED ORDER — FENTANYL CITRATE 0.05 MG/ML IJ SOLN
25.0000 ug | Freq: Once | INTRAMUSCULAR | Status: AC
Start: 2014-04-16 — End: 2014-04-16
  Administered 2014-04-16: 25 ug via INTRAVENOUS
  Filled 2014-04-16: qty 2

## 2014-04-16 MED ORDER — ACETAMINOPHEN 650 MG RE SUPP: 650.0000 mg | Freq: Four times a day (QID) | RECTAL | Status: DC | PRN

## 2014-04-16 MED ORDER — SODIUM CHLORIDE 0.9 % IV SOLN
INTRAVENOUS | Status: AC
  Administered 2014-04-16: via INTRAVENOUS

## 2014-04-16 MED ORDER — ACETAMINOPHEN 325 MG PO TABS: 650.0000 mg | ORAL_TABLET | Freq: Four times a day (QID) | ORAL | Status: DC | PRN

## 2014-04-16 MED ORDER — ONDANSETRON HCL 4 MG/2ML IJ SOLN: 4.0000 mg | Freq: Four times a day (QID) | INTRAMUSCULAR | Status: DC | PRN

## 2014-04-16 MED ORDER — LATANOPROST 0.005 % OP SOLN
1.0000 [drp] | Freq: Every day | OPHTHALMIC | Status: DC
  Administered 2014-04-17 – 2014-04-21 (×6): 1 [drp] via OPHTHALMIC
  Filled 2014-04-16: qty 2.5

## 2014-04-16 MED ORDER — PANTOPRAZOLE SODIUM 40 MG PO TBEC
40.0000 mg | DELAYED_RELEASE_TABLET | Freq: Every day | ORAL | Status: DC
  Administered 2014-04-17 – 2014-04-22 (×6): 40 mg via ORAL
  Filled 2014-04-16 (×6): qty 1

## 2014-04-16 MED ORDER — BRINZOLAMIDE 1 % OP SUSP
1.0000 [drp] | Freq: Three times a day (TID) | OPHTHALMIC | Status: DC
  Administered 2014-04-17 – 2014-04-22 (×16): 1 [drp] via OPHTHALMIC
  Filled 2014-04-16: qty 10

## 2014-04-16 MED ORDER — DONEPEZIL HCL 5 MG PO TABS
5.0000 mg | ORAL_TABLET | Freq: Every day | ORAL | Status: DC
Start: 2014-04-17 — End: 2014-04-22
  Administered 2014-04-17 – 2014-04-21 (×6): 5 mg via ORAL
  Filled 2014-04-16 (×7): qty 1

## 2014-04-16 MED ORDER — FENTANYL CITRATE 0.05 MG/ML IJ SOLN: 50.0000 ug | Freq: Once | INTRAMUSCULAR | Status: DC

## 2014-04-16 MED ORDER — SODIUM CHLORIDE 0.9 % IV BOLUS (SEPSIS)
1000.0000 mL | Freq: Once | INTRAVENOUS | Status: AC
  Administered 2014-04-16: 1000 mL via INTRAVENOUS

## 2014-04-16 MED ORDER — BRIMONIDINE TARTRATE-TIMOLOL 0.2-0.5 % OP SOLN: 1.0000 [drp] | Freq: Two times a day (BID) | OPHTHALMIC | Status: DC

## 2014-04-16 MED ORDER — CITALOPRAM HYDROBROMIDE 10 MG PO TABS
10.0000 mg | ORAL_TABLET | Freq: Every day | ORAL | Status: DC
Start: 2014-04-17 — End: 2014-04-22
  Administered 2014-04-17 – 2014-04-22 (×6): 10 mg via ORAL
  Filled 2014-04-16 (×6): qty 1

## 2014-04-16 MED ORDER — MEMANTINE HCL 5 MG PO TABS
5.0000 mg | ORAL_TABLET | Freq: Two times a day (BID) | ORAL | Status: DC
  Administered 2014-04-17 – 2014-04-22 (×12): 5 mg via ORAL
  Filled 2014-04-16 (×13): qty 1

## 2014-04-16 MED ORDER — METRONIDAZOLE IN NACL 5-0.79 MG/ML-% IV SOLN
500.0000 mg | Freq: Three times a day (TID) | INTRAVENOUS | Status: DC
  Administered 2014-04-17 (×2): 500 mg via INTRAVENOUS
  Filled 2014-04-16 (×3): qty 100

## 2014-04-16 NOTE — ED Notes (Signed)
Put patient on bedpan. 

## 2014-04-16 NOTE — ED Notes (Signed)
Pt c/o abd pain and rectal pain that started yesterday but has gotten worse today.  Pt has been to urology and she is being treated for repeat UTIs.  Pt's last BM was today and had hard BM doing night which was hard then again around at 12ish today had small softer one.

## 2014-04-16 NOTE — ED Provider Notes (Signed)
CSN: [id number]     Arrival date & time 04/16/14  1513 History   First MD Initiated Contact with Patient 04/16/14 1658     Chief Complaint  Patient presents with  . Abdominal Pain  . Rectal Pain     (Consider location/radiation/quality/duration/timing/severity/associated sxs/prior Treatment) HPI Audrey Spencer is an 80 year old female with past medical history of dementia, CLL, GERD, hyperlipidemia, recurrent UTIs, diverticulosis who presents the ER with generalized abdominal pain, rectal pain 1 day. Patient reports her pain began gradually yesterday, and she reports feeling "uncomfortable throughout the entire night last night". Patient reports she feels that she has had had a bowel movement all might, however feels that she was unable to. Patient reports that passing a bowel movement felt like "passing a brick". Patient reports since last night she is also been experiencing right-sided rectal pain, which was aggravated by attempting to have a bowel movement. Patient describes her abdominal pain as a "discomfort throughout her abdomen". Patient denies any aggravating or alleviating factors of her abdominal pain. Patient reports associated dysuria, which she reports is being evaluated by urology. Patient states she was at her urologist office for a VCUG today, however they're unable to perform the procedure due to a current UTI along with generalized symptoms of discomfort. Patient's urology office recommended patient follow-up in the emergency room for further evaluation. Patient denies having any associated nausea, vomiting, fever, chest pain, shortness of breath, dizziness, lightheadedness, hematemesis, hematochezia, melena. Patient reports her CLL has returned out of remission currently, however she is not undergoing treatment for it at this time.  Past Medical History  Diagnosis Date  . Dementia 01/25/2011  . CLL 09/22/2009  . Personal history of malignant neoplasm of breast 09/22/2009  .  ANEMIA-IRON DEFICIENCY 04/09/2007  . Bronchiectasis without acute exacerbation 01/07/2009  . GERD 02/26/2008  . HYPERLIPIDEMIA 04/09/2007  . OBSTRUCTIVE SLEEP APNEA 04/22/2008  . Pancytopenia 03/19/2009  . Recurrent UTI 01/25/2011  . DIVERTICULOSIS, COLON 04/15/2010  . GLAUCOMA 04/09/2007  . 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 OSTEOPOROSIS 03/19/2009  . RECTOCELE WITHOUT MENTION OF UTERINE PROLAPSE 04/15/2010  . Depression 05/03/2012  . Stage I breast cancer 06/29/2012    Left breast stage 1 dx 08/1996;  ER positive Rx lumpectomy/RT/Tamoxifen   Past Surgical History  Procedure Laterality Date  . Breast surgery      lumpectomy  . Abdominal hysterectomy    . Rotater cuff     Family History  Problem Relation Age of Onset  . Hypertension Father   . Cancer Sister   . Cancer Brother    History  Substance Use Topics  . Smoking status: Never Smoker   . Smokeless tobacco: Never Used  . Alcohol Use: No   OB History    No data available     Review of Systems  Constitutional: Negative for fever.  HENT: Negative for trouble swallowing.   Eyes: Negative for visual disturbance.  Respiratory: Negative for shortness of breath.   Cardiovascular: Negative for chest pain.  Gastrointestinal: Positive for abdominal pain. Negative for nausea, vomiting and blood in stool.  Genitourinary: Positive for urgency and decreased urine volume. Negative for dysuria.  Musculoskeletal: Negative for neck pain.  Skin: Negative for rash.  Neurological: Negative for dizziness, weakness and numbness.  Psychiatric/Behavioral: Negative.       Allergies  Pseudoephedrine; Codeine; Doxycycline; Nsaids; and Solifenacin succinate  Home Medications   Prior to Admission medications   Medication Sig Start Date End Date Taking? Authorizing Provider  aspirin  325 MG tablet Take 325 mg by mouth daily.   Yes Historical Provider, MD  AZOPT 1 % ophthalmic suspension Place 1 drop into the left eye Twice daily.   05/10/12  Yes Historical Provider, MD  Calcium Carbonate-Vitamin D (CALCIUM 600+D) 600-400 MG-UNIT per tablet Take 1 tablet by mouth daily.   Yes Historical Provider, MD  citalopram (CELEXA) 10 MG tablet Take 10 mg by mouth daily.   Yes Historical Provider, MD  COMBIGAN 0.2-0.5 % ophthalmic solution Place 1 drop into both eyes 2 (two) times daily.  06/06/13  Yes Historical Provider, MD  donepezil (ARICEPT) 5 MG tablet Take 1 tablet (5 mg total) by mouth at bedtime. 10/11/13  Yes Biagio Borg, MD  latanoprost (XALATAN) 0.005 % ophthalmic solution Place into the left eye at bedtime. 12/24/12  Yes Historical Provider, MD  memantine (NAMENDA) 5 MG tablet Take 1 tablet (5 mg total) by mouth 2 (two) times daily. 10/11/13  Yes Biagio Borg, MD  Multiple Vitamin (MULTIVITAMIN WITH MINERALS) TABS tablet Take 1 tablet by mouth daily.   Yes Historical Provider, MD  omeprazole (PRILOSEC) 20 MG capsule TAKE ONE CAPSULE BY MOUTH EVERY DAY 03/25/14  Yes Biagio Borg, MD  cefUROXime (CEFTIN) 250 MG tablet Take 1 tablet (250 mg total) by mouth 2 (two) times daily. Patient not taking: Reported on 04/16/2014 02/06/14   Biagio Borg, MD   BP 82/44 mmHg  Pulse 102  Temp(Src) 98 F (36.7 C) (Oral)  Resp 20  Ht 5\' 2"  (1.575 m)  Wt 151 lb 0.2 oz (68.5 kg)  BMI 27.61 kg/m2  SpO2 100% Physical Exam  Constitutional: She is oriented to person, place, and time. She appears well-developed and well-nourished. No distress.  HENT:  Head: Normocephalic and atraumatic.  Mouth/Throat: Oropharynx is clear and moist. No oropharyngeal exudate.  Eyes: EOM are normal. Pupils are equal, round, and reactive to light. Right eye exhibits no discharge. Left eye exhibits no discharge. No scleral icterus.  Neck: Normal range of motion.  Cardiovascular: Normal rate, regular rhythm and normal heart sounds.   No murmur heard. Pulmonary/Chest: Effort normal and breath sounds normal. No respiratory distress.  Abdominal: Soft. Normal appearance  and bowel sounds are normal. There is tenderness in the suprapubic area and left lower quadrant. There is no rigidity, no guarding, no tenderness at McBurney's point and negative Murphy's sign.  Genitourinary: Rectal exam shows external hemorrhoid and tenderness. Rectal exam shows no internal hemorrhoid, no fissure, no mass and anal tone normal.  Small, external hemorrhoid noted at 3:00 position of patient's rectum. Small amount of brown colored stool noted in rectal vault. No frank blood noted on exam.rectal tone normal. Chaperone present during entire exam.  Musculoskeletal: Normal range of motion. She exhibits no edema or tenderness.  Neurological: She is alert and oriented to person, place, and time. No cranial nerve deficit. Coordination normal.  Skin: Skin is warm and dry. No rash noted. She is not diaphoretic.  Psychiatric: She has a normal mood and affect.  Nursing note and vitals reviewed.   ED Course  Procedures (including critical care time) Labs Review Labs Reviewed  CBC WITH DIFFERENTIAL - Abnormal; Notable for the following:    WBC 21.7 (*)    RBC 3.26 (*)    Hemoglobin 9.9 (*)    HCT 31.1 (*)    RDW 15.9 (*)    Platelets 113 (*)    Neutrophils Relative % 21 (*)    Lymphocytes Relative 75 (*)  Lymphs Abs 16.3 (*)    All other components within normal limits  COMPREHENSIVE METABOLIC PANEL - Abnormal; Notable for the following:    Glucose, Bld 115 (*)    GFR calc non Af Amer 84 (*)    All other components within normal limits  URINALYSIS, ROUTINE W REFLEX MICROSCOPIC - Abnormal; Notable for the following:    APPearance CLOUDY (*)    Hgb urine dipstick MODERATE (*)    Bilirubin Urine SMALL (*)    Nitrite POSITIVE (*)    Leukocytes, UA MODERATE (*)    All other components within normal limits  URINE MICROSCOPIC-ADD ON - Abnormal; Notable for the following:    Bacteria, UA MANY (*)    All other components within normal limits  BLOOD GAS, ARTERIAL - Abnormal; Notable  for the following:    pH, Arterial 7.333 (*)    pCO2 arterial 56.1 (*)    pO2, Arterial 127.0 (*)    Bicarbonate 29.2 (*)    Acid-Base Excess 2.9 (*)    All other components within normal limits  CBC - Abnormal; Notable for the following:    WBC 19.3 (*)    RBC 2.92 (*)    Hemoglobin 8.6 (*)    HCT 28.4 (*)    RDW 16.0 (*)    Platelets 105 (*)    All other components within normal limits  POC OCCULT BLOOD, ED - Abnormal; Notable for the following:    Fecal Occult Bld POSITIVE (*)    All other components within normal limits  LIPASE, BLOOD  PATHOLOGIST SMEAR REVIEW  COMPREHENSIVE METABOLIC PANEL  CBC WITH DIFFERENTIAL  I-STAT CG4 LACTIC ACID, ED  TYPE AND SCREEN  ABO/RH    Imaging Review Ct Abdomen Pelvis W Contrast  04/16/2014   CLINICAL DATA:  Low abdominal and rectal pain, constipation  EXAM: CT ABDOMEN AND PELVIS WITH CONTRAST  TECHNIQUE: Multidetector CT imaging of the abdomen and pelvis was performed using the standard protocol following bolus administration of intravenous contrast.  CONTRAST:  142mL OMNIPAQUE IOHEXOL 300 MG/ML  SOLN  COMPARISON:  07/28/2010, 07/31/2013  FINDINGS: Lung bases are well aerated and demonstrates some minimal scarring in the left lower lobe. Some reticular nodular changes are noted in the lateral aspect of the right lower lobe which appear to be a sequelae from prior inflammatory change seen on the prior exam. It would be difficult however to exclude an early infiltrate on the basis of this exam. Cardiac shadow is enlarged but stable.  Multiple hypodensities are noted throughout the liver. The largest of these lies in the dome of the right lobe of the liver anteriorly measuring 3.5 cm in greatest dimension. These are stable from the prior exam. The spleen, adrenal glands, pancreas and gallbladder are within normal limits. The kidneys are well visualized and demonstrates cystic change as well. The largest of these lies in the left kidney measuring 5.0 cm  in greatest dimension. This is also stable in appearance from the prior exam. No renal calculi or obstructive changes are noted. Delayed images demonstrate normal excretion of contrast from the kidneys bilaterally. Scattered large and small bowel gas is noted. No obstructive changes are seen. Diverticular changes are noted within the sigmoid colon without evidence of diverticulitis. The appendix is well visualized and within normal limits.  The bladder is partially distended. There are bilateral prominent inguinal lymph nodes identified which are new from the prior CT examination from 2012. The largest of these on the left measures 2.2  cm in short axis. The largest of these on the right measures 1.5 cm in short axis. These changes are consistent with the patient's given clinical history. Additional lymph nodes are noted with more normal fatty hila in the upper thigh bilaterally. Lymphadenopathy is also noted in the iliac chain along the pelvic wall adjacent to the acetabulum in a symmetrical fashion bilaterally. Multiple retroperitoneal lymph nodes are noted in the periaortic and pericaval region. Small nodes are noted adjacent to the celiac axis and gastrohepatic ligament. Changes of prior vertebral augmentation are seen. Multilevel degenerative changes in the lumbar spine are noted.  IMPRESSION: Hepatic and renal cysts.  Multiple inguinal, iliac and retroperitoneal lymph nodes most consistent with lymphoma. These have increased in the interval from the prior exam. Further evaluation is recommended as clinically indicated.  Changes in the right lower lobe which are likely related to sequela from prior inflammatory change.   Electronically Signed   By: Inez Catalina M.D.   On: 04/16/2014 18:56     EKG Interpretation None      MDM   Final diagnoses:  LLQ abdominal pain  Rectal pain    Patient with CLL and recurrent UTIs here with left lower quadrant abdominal pain, constipation since last night. Patient  has history of diverticulosis, CLL which is in a relapse, as well as a recurrent UTI with UTI being diagnosed today at her urologist office. Will workup for acute abdomen, with symptomatic therapy and CT abdomen pelvis.  CT abdomen pelvis with impression: Hepatic and renal cysts.  Multiple inguinal, iliac and retroperitoneal lymph nodes most consistent with lymphoma. These have increased in the interval from the prior exam. Further evaluation is recommended as clinically indicated.  Changes in the right lower lobe which are likely related to sequela from prior inflammatory change.  Patient has leukocytosis of 21.7, anemia of 9.9 which is decreased from 11 approximately 2 weeks ago. With these findings, along with patient's age and inflamed lymph nodes on CT, we'll place consult to have patient admitted for observation. Patient's pain managed in the ER.   Patient's O2 saturation dropped to low 70s. On reexam, patient in no respiratory distress, denying shortness of breath.Will follow-up with ABG.  ABG remarkable for a partially compensated respiratory acidosis, however patient's PO2 127. Patient's low O2 saturation not believed to be due to any acute pathology. This could be due to drop of blood pressure from pain medication, or patient's anemia. Patient continues to deny any shortness of breath, and is in no respiratory distress.  Pt to be admitted to [facility] for observation based on leukocytosis, urinary tract infection, decrease in anemia.  The patient appears reasonably stabilized for admission considering the current resources, flow, and capabilities available in the ED at this time, and I doubt any other [facility] requiring further screening and/or treatment in the ED prior to admission.  BP 82/44 mmHg  Pulse 102  Temp(Src) 98 F (36.7 C) (Oral)  Resp 20  Ht 5\' 2"  (1.575 m)  Wt 151 lb 0.2 oz (68.5 kg)  BMI 27.61 kg/m2  SpO2 100%  Signed,  Dahlia Bailiff, PA-C 2:14 AM  Patient seen and  discussed with Dr. Ernestina Patches, M.D.     Carrie Mew, PA-C 04/17/14 [id number]  Ernestina Patches, MD 04/17/14 [phone]

## 2014-04-16 NOTE — Telephone Encounter (Signed)
Patient informed of PCP instructions. 

## 2014-04-16 NOTE — Telephone Encounter (Signed)
Very sorry, I normally cannot do this, as we have already promised the time to other patients.  Please consider other OV next available

## 2014-04-16 NOTE — Telephone Encounter (Signed)
Patient came in and spoke with [facility].

## 2014-04-16 NOTE — ED Notes (Signed)
Patient transported to CT 

## 2014-04-16 NOTE — H&P (Signed)
[facility] History and Physical  Audrey Spencer ZOX:[id number] DOB: 10-06-1922 DOA: 04/16/2014  Referring physician: ER physician. PCP: [name], MD  Chief Complaint: Rectal pain.  HPI: Audrey Spencer is a 80 y.o. female with history of CLL in remission, OSA noncompliant with CPAP, depression and dementia who lives alone and is frequently checked by patient's niece was brought to the ER after patient was having increasing rectal pain over the last 3-4 days. Patient's pain acutely worsened today. Patient also had gone to her urologist today and was prescribed antibiotics for UTI. But because of the rectal pain patient had come to the ER. In the ER CT abdomen and pelvis done showed increasing lymph node size with known history of CLL otherwise nothing acute. ER physician had done rectal exam which showed external hemorrhoids with stool for occult blood being positive. There is no active bleed. Labs show leukocytosis with worsening hemoglobin and platelet counts. UA is consistent with UTI. Patient's rectal pain improved with fentanyl injection. Patient has been admitted for further management. Patient denies any chest pain or shortness of breath. Patient is able to read with no nausea or vomiting. But when the pain episodes happen it is quite severe as per the patient's niece.  Review of Systems: As presented in the history of presenting illness, rest negative.  Past Medical History  Diagnosis Date  . Dementia 01/25/2011  . CLL 09/22/2009  . Personal history of malignant neoplasm of breast 09/22/2009  . ANEMIA-IRON DEFICIENCY 04/09/2007  . Bronchiectasis without acute exacerbation 01/07/2009  . GERD 02/26/2008  . HYPERLIPIDEMIA 04/09/2007  . OBSTRUCTIVE SLEEP APNEA 04/22/2008  . Pancytopenia 03/19/2009  . Recurrent UTI 01/25/2011  . DIVERTICULOSIS, COLON 04/15/2010  . GLAUCOMA 04/09/2007  . 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 OSTEOPOROSIS 03/19/2009  . RECTOCELE WITHOUT MENTION  OF UTERINE PROLAPSE 04/15/2010  . Depression 05/03/2012  . Stage I breast cancer 06/29/2012    Left breast stage 1 dx 08/1996;  ER positive Rx lumpectomy/RT/Tamoxifen   Past Surgical History  Procedure Laterality Date  . Breast surgery      lumpectomy  . Abdominal hysterectomy    . Rotater cuff     Social History:  reports that she has never smoked. She has never used smokeless tobacco. She reports that she does not drink alcohol or use illicit drugs. Where does patient live home. Can patient participate in ADLs? Yes.  Allergies  Allergen Reactions  . Pseudoephedrine Other (See Comments)    insomnia  . Codeine     REACTION: nausea (mild)  . Doxycycline     REACTION: Nausea  . Nsaids     REACTION: GI bleed/anemia  . Solifenacin Succinate     REACTION: blurred vision    Family History:  Family History  Problem Relation Age of Onset  . Hypertension Father   . Cancer Sister   . Cancer Brother       Prior to Admission medications   Medication Sig Start Date End Date Taking? Authorizing Provider  aspirin 325 MG tablet Take 325 mg by mouth daily.   Yes Historical Provider, MD  AZOPT 1 % ophthalmic suspension Place 1 drop into the left eye Twice daily.  05/10/12  Yes Historical Provider, MD  Calcium Carbonate-Vitamin D (CALCIUM 600+D) 600-400 MG-UNIT per tablet Take 1 tablet by mouth daily.   Yes Historical Provider, MD  citalopram (CELEXA) 10 MG tablet Take 10 mg by mouth daily.   Yes Historical Provider, MD  COMBIGAN 0.2-0.5 %  ophthalmic solution Place 1 drop into both eyes 2 (two) times daily.  06/06/13  Yes Historical Provider, MD  donepezil (ARICEPT) 5 MG tablet Take 1 tablet (5 mg total) by mouth at bedtime. 10/11/13  Yes Biagio Borg, MD  latanoprost (XALATAN) 0.005 % ophthalmic solution Place into the left eye at bedtime. 12/24/12  Yes Historical Provider, MD  memantine (NAMENDA) 5 MG tablet Take 1 tablet (5 mg total) by mouth 2 (two) times daily. 10/11/13  Yes Biagio Borg, MD   Multiple Vitamin (MULTIVITAMIN WITH MINERALS) TABS tablet Take 1 tablet by mouth daily.   Yes Historical Provider, MD  omeprazole (PRILOSEC) 20 MG capsule TAKE ONE CAPSULE BY MOUTH EVERY DAY 03/25/14  Yes Biagio Borg, MD  cefUROXime (CEFTIN) 250 MG tablet Take 1 tablet (250 mg total) by mouth 2 (two) times daily. Patient not taking: Reported on 04/16/2014 02/06/14   Biagio Borg, MD    Physical Exam: Filed Vitals:   04/16/14 1811 04/16/14 2030 04/16/14 2127 04/16/14 2128  BP: 156/57 125/51    Pulse: 92 103    Temp:  97.8 F (36.6 C)    TempSrc:  Oral    Resp: 16 16    SpO2: 90%  79% 100%     General:  Well-developed and nourished.  Eyes: Anicteric no pallor.  ENT: No discharge from the ears eyes nose mouth.  Neck: No mass felt.  Cardiovascular: S1-S2 heard.  Respiratory: No rhonchi or crepitations.  Abdomen: Soft nontender bowel sounds present.  Skin: No rash.  Musculoskeletal: No edema.  Psychiatric: Patient has history of dementia.  Neurologic: Alert awake oriented to name and place. Moves all extremities.  Labs on Admission:  Basic Metabolic Panel:  Recent Labs Lab 04/16/14 1624  NA 138  K 4.3  CL 98  CO2 28  GLUCOSE 115*  BUN 14  CREATININE 0.50  CALCIUM 9.3   Liver Function Tests:  Recent Labs Lab 04/16/14 1624  AST 29  ALT 20  ALKPHOS 76  BILITOT 0.4  PROT 6.7  ALBUMIN 4.0    Recent Labs Lab 04/16/14 1624  LIPASE 20   No results for input(s): AMMONIA in the last 168 hours. CBC:  Recent Labs Lab 04/16/14 1624  WBC 21.7*  NEUTROABS 4.6  HGB 9.9*  HCT 31.1*  MCV 95.4  PLT 113*   Cardiac Enzymes: No results for input(s): CKTOTAL, CKMB, CKMBINDEX, TROPONINI in the last 168 hours.  BNP (last 3 results) No results for input(s): PROBNP in the last 8760 hours. CBG: No results for input(s): GLUCAP in the last 168 hours.  Radiological Exams on Admission: Ct Abdomen Pelvis W Contrast  04/16/2014   CLINICAL DATA:  Low  abdominal and rectal pain, constipation  EXAM: CT ABDOMEN AND PELVIS WITH CONTRAST  TECHNIQUE: Multidetector CT imaging of the abdomen and pelvis was performed using the standard protocol following bolus administration of intravenous contrast.  CONTRAST:  135mL OMNIPAQUE IOHEXOL 300 MG/ML  SOLN  COMPARISON:  07/28/2010, 07/31/2013  FINDINGS: Lung bases are well aerated and demonstrates some minimal scarring in the left lower lobe. Some reticular nodular changes are noted in the lateral aspect of the right lower lobe which appear to be a sequelae from prior inflammatory change seen on the prior exam. It would be difficult however to exclude an early infiltrate on the basis of this exam. Cardiac shadow is enlarged but stable.  Multiple hypodensities are noted throughout the liver. The largest of these lies in the dome  of the right lobe of the liver anteriorly measuring 3.5 cm in greatest dimension. These are stable from the prior exam. The spleen, adrenal glands, pancreas and gallbladder are within normal limits. The kidneys are well visualized and demonstrates cystic change as well. The largest of these lies in the left kidney measuring 5.0 cm in greatest dimension. This is also stable in appearance from the prior exam. No renal calculi or obstructive changes are noted. Delayed images demonstrate normal excretion of contrast from the kidneys bilaterally. Scattered large and small bowel gas is noted. No obstructive changes are seen. Diverticular changes are noted within the sigmoid colon without evidence of diverticulitis. The appendix is well visualized and within normal limits.  The bladder is partially distended. There are bilateral prominent inguinal lymph nodes identified which are new from the prior CT examination from 2012. The largest of these on the left measures 2.2 cm in short axis. The largest of these on the right measures 1.5 cm in short axis. These changes are consistent with the patient's given  clinical history. Additional lymph nodes are noted with more normal fatty hila in the upper thigh bilaterally. Lymphadenopathy is also noted in the iliac chain along the pelvic wall adjacent to the acetabulum in a symmetrical fashion bilaterally. Multiple retroperitoneal lymph nodes are noted in the periaortic and pericaval region. Small nodes are noted adjacent to the celiac axis and gastrohepatic ligament. Changes of prior vertebral augmentation are seen. Multilevel degenerative changes in the lumbar spine are noted.  IMPRESSION: Hepatic and renal cysts.  Multiple inguinal, iliac and retroperitoneal lymph nodes most consistent with lymphoma. These have increased in the interval from the prior exam. Further evaluation is recommended as clinically indicated.  Changes in the right lower lobe which are likely related to sequela from prior inflammatory change.   Electronically Signed   By: Inez Catalina M.D.   On: 04/16/2014 18:56     Assessment/Plan Principal Problem:   Rectal pain Active Problems:   Dementia   CLL (chronic lymphocytic leukemia)   Leukocytosis   UTI (lower urinary tract infection)   1. Rectal pain - could be related to the worsening lymph nodes seen in the CT abdomen or also could be possible fissure or external hemorrhoids causing the pain. At this time I have ordered Anusol rectal cream. Place patient on regular diet with when necessary IV fentanyl for pain relief. Closely observe. 2. UTI - patient has history of recurrent UTI. Patient has been placed on Cipro and Flagyl for now to also cover for possible rectal pain. Follow urine cultures. 3. Leukocytosis with worsening anemia and thrombocytopenia - may be related to patient's known history of CLL with CAT scan showing increasing lymph node size. May discuss with patient's oncologist Dr. Elson Areas in a.m. Closely follow CBC. Leukocytosis may also be related to infection. 4. Possible GI bleed - patient's stool for occult blood was  positive. Closely follow CBC. Bleeding source could also be from the hemorrhoids. 5. CLL - see #3. 6. History of OSA - patient has been noncompliant with her CPAP. Patient has a stem of Implanon option as patient became mildly hypoxic after was given fentanyl. 7. Dementia - continue present medications.    Code Status: Full code.  Family Communication: Patient's niece at the bedside.  Disposition Plan: Admit to inpatient.    Sharena Dibenedetto N. [facility] Pager [phone].  If 7PM-7AM, please contact night-coverage [email or web address] Password [email or web address] 04/16/2014, 10:30 PM

## 2014-04-16 NOTE — Telephone Encounter (Signed)
Niece is requesting to bring Aunt over this afternoon.  Right now Stewart is at [place] having test ran.  She is having issues urinating and having bowel movements.  Everyone is booked this afternoon.  Niece states it takes a lot to get her up and dressed and that is why they are requesting to come this afternoon.

## 2014-04-17 ENCOUNTER — Telehealth: Payer: Self-pay | Admitting: Hematology and Oncology

## 2014-04-17 ENCOUNTER — Inpatient Hospital Stay (HOSPITAL_COMMUNITY): Payer: Medicare Other

## 2014-04-17 DIAGNOSIS — C9110 Chronic lymphocytic leukemia of B-cell type not having achieved remission: Secondary | ICD-10-CM

## 2014-04-17 LAB — COMPREHENSIVE METABOLIC PANEL
ALT: 22 U/L (ref 0–35)
ANION GAP: 5 (ref 5–15)
AST: 24 U/L (ref 0–37)
Albumin: 2.9 g/dL — ABNORMAL LOW (ref 3.5–5.2)
Alkaline Phosphatase: 59 U/L (ref 39–117)
BILIRUBIN TOTAL: 0.2 mg/dL — AB (ref 0.3–1.2)
BUN: 15 mg/dL (ref 6–23)
CALCIUM: 8.6 mg/dL (ref 8.4–10.5)
CHLORIDE: 102 meq/L (ref 96–112)
CO2: 31 mEq/L (ref 19–32)
Creatinine, Ser: 0.68 mg/dL (ref 0.50–1.10)
GFR calc Af Amer: 88 mL/min — ABNORMAL LOW (ref >90)
GFR, EST NON AFRICAN AMERICAN: 76 mL/min — AB (ref >90)
Glucose, Bld: 133 mg/dL — ABNORMAL HIGH (ref 70–99)
Potassium: 4.1 mEq/L (ref 3.7–5.3)
Sodium: 138 mEq/L (ref 137–147)
Total Protein: 5.1 g/dL — ABNORMAL LOW (ref 6.0–8.3)

## 2014-04-17 LAB — CBC WITH DIFFERENTIAL/PLATELET
Basophils Absolute: 0 10*3/uL (ref 0.0–0.1)
Basophils Relative: 0 % (ref 0–1)
EOS ABS: 0 10*3/uL (ref 0.0–0.7)
Eosinophils Relative: 0 % (ref 0–5)
HCT: 25 % — ABNORMAL LOW (ref 36.0–46.0)
Hemoglobin: 7.6 g/dL — ABNORMAL LOW (ref 12.0–15.0)
LYMPHS ABS: 10.3 10*3/uL — AB (ref 0.7–4.0)
Lymphocytes Relative: 75 % — ABNORMAL HIGH (ref 12–46)
MCH: 29.7 pg (ref 26.0–34.0)
MCHC: 30.4 g/dL (ref 30.0–36.0)
MCV: 97.7 fL (ref 78.0–100.0)
MONO ABS: 0.4 10*3/uL (ref 0.1–1.0)
Monocytes Relative: 3 % (ref 3–12)
NEUTROS PCT: 22 % — AB (ref 43–77)
Neutro Abs: 3 10*3/uL (ref 1.7–7.7)
PLATELETS: 92 10*3/uL — AB (ref 150–400)
RBC: 2.56 MIL/uL — ABNORMAL LOW (ref 3.87–5.11)
RDW: 16.3 % — AB (ref 11.5–15.5)
WBC: 13.7 10*3/uL — ABNORMAL HIGH (ref 4.0–10.5)

## 2014-04-17 LAB — TYPE AND SCREEN
ABO/RH(D): A POS
Antibody Screen: NEGATIVE

## 2014-04-17 LAB — CBC
HCT: 28.4 % — ABNORMAL LOW (ref 36.0–46.0)
Hemoglobin: 8.6 g/dL — ABNORMAL LOW (ref 12.0–15.0)
MCH: 29.5 pg (ref 26.0–34.0)
MCHC: 30.3 g/dL (ref 30.0–36.0)
MCV: 97.3 fL (ref 78.0–100.0)
PLATELETS: 105 10*3/uL — AB (ref 150–400)
RBC: 2.92 MIL/uL — ABNORMAL LOW (ref 3.87–5.11)
RDW: 16 % — AB (ref 11.5–15.5)
WBC: 19.3 10*3/uL — ABNORMAL HIGH (ref 4.0–10.5)

## 2014-04-17 LAB — ABO/RH: ABO/RH(D): A POS

## 2014-04-17 MED ORDER — CIPROFLOXACIN IN D5W 400 MG/200ML IV SOLN
400.0000 mg | Freq: Two times a day (BID) | INTRAVENOUS | Status: DC
  Administered 2014-04-17 – 2014-04-18 (×3): 400 mg via INTRAVENOUS
  Filled 2014-04-17 (×4): qty 200

## 2014-04-17 MED ORDER — BRIMONIDINE TARTRATE 0.2 % OP SOLN
1.0000 [drp] | Freq: Two times a day (BID) | OPHTHALMIC | Status: DC
  Administered 2014-04-17 – 2014-04-22 (×12): 1 [drp] via OPHTHALMIC
  Filled 2014-04-17: qty 5

## 2014-04-17 MED ORDER — TIMOLOL MALEATE 0.5 % OP SOLN
1.0000 [drp] | Freq: Two times a day (BID) | OPHTHALMIC | Status: DC
  Administered 2014-04-17 – 2014-04-22 (×12): 1 [drp] via OPHTHALMIC
  Filled 2014-04-17: qty 5

## 2014-04-17 MED ORDER — METRONIDAZOLE IN NACL 5-0.79 MG/ML-% IV SOLN
500.0000 mg | Freq: Three times a day (TID) | INTRAVENOUS | Status: DC
  Administered 2014-04-18 – 2014-04-22 (×14): 500 mg via INTRAVENOUS
  Filled 2014-04-17 (×16): qty 100

## 2014-04-17 MED ORDER — CETYLPYRIDINIUM CHLORIDE 0.05 % MT LIQD
7.0000 mL | Freq: Two times a day (BID) | OROMUCOSAL | Status: DC
  Administered 2014-04-17 – 2014-04-22 (×11): 7 mL via OROMUCOSAL

## 2014-04-17 NOTE — Plan of Care (Signed)
Problem: Phase II Progression Outcomes Goal: Obtain order to discontinue catheter if appropriate Outcome: Not Applicable Date Met:  04/17/14     

## 2014-04-17 NOTE — Plan of Care (Signed)
Problem: Phase I Progression Outcomes Goal: Hemodynamically stable Outcome: Completed/Met Date Met:  04/17/14

## 2014-04-17 NOTE — Plan of Care (Signed)
Problem: Phase III Progression Outcomes Goal: Foley discontinued Outcome: Not Applicable Date Met:  82/86/75

## 2014-04-17 NOTE — Progress Notes (Signed)
Patient arrived on the unit at approximately 2350 via stretcher and accompanied by daughter. Patient is alert and oriented x 4 but has short term memory problems. Had one small soft bowel movement and complained of pain while having BM. Daughter to verify if patient is up to date with flu and pneumonia vaccines.

## 2014-04-17 NOTE — Evaluation (Signed)
Physical Therapy Evaluation Patient Details Name: Audrey Spencer MRN: [id number] DOB: 05/06/1923 Today's Date: 04/17/2014   History of Present Illness  80 yo female admitted with rectal pain, weakness. Hx of CLL, anemia, osteoporosis, glaucoma, breast cancer. Pt lives alone with family nearby  Clinical Impression  On eval, pt required Min assist for mobility-able to perform stand pivot x 2, bed<>BSC. O2 sats 82% on RA so reapplied [place] O2. Pt fatigued and was SOB with minimal activity. At this time, recommend ST rehab at [facility] unless strength and activity tolerance improve. Pt lives alone and would need to be able to ambulate and perform ADLs without assistance.     Follow Up Recommendations SNF;Supervision/Assistance - 24 hour (unless strength and activity tolerance improve)    Equipment Recommendations  None recommended by PT    Recommendations for Other Services OT consult     Precautions / Restrictions Precautions Precautions: Fall Restrictions Weight Bearing Restrictions: No      Mobility  Bed Mobility Overal bed mobility: Needs Assistance Bed Mobility: Supine to Sit;Sit to Supine     Supine to sit: HOB elevated;Min assist Sit to supine: HOB elevated;Min guard   General bed mobility comments: Assist for trunk to upright. Increased time.   Transfers Overall transfer level: Needs assistance Equipment used: Rolling walker (2 wheeled) Transfers: Stand Pivot Transfers;Sit to/from Stand Sit to Stand: Min assist Stand pivot transfers: Min assist       General transfer comment: Assist to rise, stabilize, control descent. VCS safety, technique, hand placement. O2 sats 82% on RA so replaced [place] O2.   Ambulation/Gait             General Gait Details: NT-pt fatigued and noted SOB with transfer.   Stairs            Wheelchair Mobility    Modified Rankin (Stroke Patients Only)       Balance                                              Pertinent Vitals/Pain Pain Assessment: No/denies pain (except when having bowel movement per pt)    Home Living Family/patient expects to be discharged to:: Private residence Living Arrangements: Alone Available Help at Discharge: Family;Available PRN/intermittently (family nearby) Type of Home: House Home Access: Stairs to enter Entrance Stairs-Rails: Psychiatric nurse of Steps: 1 Home Layout: One level Home Equipment: Cane - single point;Walker - 2 wheels Additional Comments: granddaughter lives next door but works. pt has a life alert    Prior Function Level of Independence: Independent with assistive device(s)      ADL's / Homemaking Assistance Needed: cleaning lady once a week. supervision for getting into/out of shower  Comments: uses cane outside     Hand Dominance        Extremity/Trunk Assessment   Upper Extremity Assessment: Generalized weakness           Lower Extremity Assessment: Generalized weakness      Cervical / Trunk Assessment: Kyphotic  Communication   Communication: No difficulties  Cognition Arousal/Alertness: Awake/alert Behavior During Therapy: WFL for tasks assessed/performed Overall Cognitive Status: Within Functional Limits for tasks assessed                      General Comments      Exercises  Assessment/Plan    PT Assessment Patient needs continued PT services  PT Diagnosis Difficulty walking;Generalized weakness   PT Problem List Decreased strength;Decreased activity tolerance;Decreased balance;Decreased mobility;Decreased knowledge of use of DME  PT Treatment Interventions DME instruction;Gait training;Functional mobility training;Therapeutic activities;Patient/family education;Balance training;Therapeutic exercise   PT Goals (Current goals can be found in the Care Plan section) Acute Rehab PT Goals Patient Stated Goal: to return home PT Goal Formulation: With patient Time For Goal  Achievement: 05/01/14 Potential to Achieve Goals: Good    Frequency Min 3X/week   Barriers to discharge Decreased caregiver support      Co-evaluation               End of Session Equipment Utilized During Treatment: Gait belt;Oxygen Activity Tolerance: Patient limited by fatigue Patient left: in bed;with call bell/phone within reach;with bed alarm set           Time: 6433-2951 PT Time Calculation (min) (ACUTE ONLY): 42 min   Charges:   PT Evaluation $Initial PT Evaluation Tier I: 1 Procedure PT Treatments $Therapeutic Activity: 23-37 mins   PT G Codes:          Weston Anna, MPT Pager: [phone]

## 2014-04-17 NOTE — Care Management Note (Signed)
CARE MANAGEMENT NOTE 04/17/2014  Patient:  Audrey Spencer, Audrey Spencer   Account Number:  [id number]  Date Initiated:  04/17/2014  Documentation initiated by:  Marney Doctor  Subjective/Objective Assessment:   80 yo admited with Rectal pain and UTI     Action/Plan:   From home alone   Anticipated DC Date:  04/20/2014   Anticipated DC Plan:  SKILLED NURSING FACILITY  In-house referral  Clinical Social Worker      DC Planning Services  CM consult      Choice offered to / List presented to:             Status of service:  In process, will continue to follow Medicare Important Message given?   (If response is "NO", the following Medicare IM given date fields will be blank) Date Medicare IM given:   Medicare IM given by:   Date Additional Medicare IM given:   Additional Medicare IM given by:    Discharge Disposition:    Per UR Regulation:  Reviewed for med. necessity/level of care/duration of stay  If discussed at [place] of Stay Meetings, dates discussed:    Comments:  04/17/14 Marney Doctor RN,BSN,NCM [phone] Chart reviewed.  PT is recommending SNF.  CM will continue to follow for DC needs.

## 2014-04-17 NOTE — Progress Notes (Signed)
ANTIBIOTIC CONSULT NOTE - INITIAL  Pharmacy Consult for Cipro Indication: Intra-abdominal infection  Allergies  Allergen Reactions  . Pseudoephedrine Other (See Comments)    insomnia  . Codeine     REACTION: nausea (mild)  . Doxycycline     REACTION: Nausea  . Nsaids     REACTION: GI bleed/anemia  . Solifenacin Succinate     REACTION: blurred vision    Patient Measurements: Height: 5\' 2"  (157.5 cm) Weight: 151 lb 0.2 oz (68.5 kg) IBW/kg (Calculated) : 50.1   Vital Signs: Temp: 98 F (36.7 C) (11/19 0020) Temp Source: Oral (11/19 0020) BP: 82/44 mmHg (11/19 0020) Pulse Rate: 102 (11/19 0020) Intake/Output from previous day:   Intake/Output from this shift:    Labs:  Recent Labs  04/16/14 1624  WBC 21.7*  HGB 9.9*  PLT 113*  CREATININE 0.50   Estimated Creatinine Clearance: 44.1 mL/min (by C-G formula based on Cr of 0.5). No results for input(s): VANCOTROUGH, VANCOPEAK, VANCORANDOM, GENTTROUGH, GENTPEAK, GENTRANDOM, TOBRATROUGH, TOBRAPEAK, TOBRARND, AMIKACINPEAK, AMIKACINTROU, AMIKACIN in the last 72 hours.   Microbiology: No results found for this or any previous visit (from the past 720 hour(s)).  Medical History: Past Medical History  Diagnosis Date  . Dementia 01/25/2011  . CLL 09/22/2009  . Personal history of malignant neoplasm of breast 09/22/2009  . ANEMIA-IRON DEFICIENCY 04/09/2007  . Bronchiectasis without acute exacerbation 01/07/2009  . GERD 02/26/2008  . HYPERLIPIDEMIA 04/09/2007  . OBSTRUCTIVE SLEEP APNEA 04/22/2008  . Pancytopenia 03/19/2009  . Recurrent UTI 01/25/2011  . DIVERTICULOSIS, COLON 04/15/2010  . GLAUCOMA 04/09/2007  . 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 OSTEOPOROSIS 03/19/2009  . RECTOCELE WITHOUT MENTION OF UTERINE PROLAPSE 04/15/2010  . Depression 05/03/2012  . Stage I breast cancer 06/29/2012    Left breast stage 1 dx 08/1996;  ER positive Rx lumpectomy/RT/Tamoxifen    Medications:  Scheduled:  . brimonidine  1 drop  Both Eyes BID   And  . timolol  1 drop Both Eyes BID  . brinzolamide  1 drop Left Eye TID  . ciprofloxacin  400 mg Intravenous Q12H  . citalopram  10 mg Oral Daily  . donepezil  5 mg Oral QHS  . hydrocortisone   Rectal TID  . latanoprost  1 drop Left Eye QHS  . memantine  5 mg Oral BID  . metronidazole  500 mg Intravenous Q8H  . multivitamin with minerals  1 tablet Oral Daily  . pantoprazole  40 mg Oral Daily   Infusions:  . sodium chloride 50 mL/hr at 04/16/14 2358   Assessment: 70 yoF with intra-abdominal infection.  Cipro per Rx and Flagy per MD.   Goal of Therapy:  Treat infection  Plan:   Cipro 400mg  IV q12h  F/U SCr/cultures  Lawana Pai R 04/17/2014,12:44 AM

## 2014-04-17 NOTE — Telephone Encounter (Signed)
lvm for [facility] with pt DEC appt....mailed pt appt sched and letter

## 2014-04-17 NOTE — Progress Notes (Signed)
Patient ID: Audrey Spencer, female   DOB: [birth year], 80 y.o.   MRN: [id number] [facility] PROGRESS NOTE  Audrey Spencer OHY:[id number] DOB: 1923/02/13 DOA: 04/16/2014 PCP: [name], MD  Brief narrative:    80 year old female with past medical history of CLL in remission, OSA noncompliant with CPAP, dementia, depression who presented to [facility] ED 04/16/2014 with worsening rectal pain over past few days prior to this admission. Of note, patient was recently seen by urologist and treated for UTI.  On admission, vitals were stable. CT abdomen and pelvis showed multiple inguinal, iliac and retroperitoneal lymph nodes most consistent with lymphoma. Additionally, blood work was significant for elevated white blood cell count at 21.7, hemoglobin 9.9, platelet count 113. Urinalysis was consistent with urinary tract infection. Patient was started on empiric antibiotics, ciprofloxacin and Flagyl.  Assessment/Plan:    Rectal pain / Leukocytosis / History of CLL - Unclear etiology. CT abdomen/pelvis done on the admission showed multiple inguinal, iliac and retroperitoneal lymph nodes most consistent with lymphoma.  - Will continue Cipro and Flagyl considering leukocytosis, UTI, potential intra-abdominal infection although CT scan was significant for lymphadenopathy rather than acute infection. White blood cell count has however trended down from 21.7 down to 13.7. - Patient is currently 3 out of 10. - Will continue Anusol rectal cream. - Patient had bowel movement this morning. - I spoke with Dr. Alvy Bimler who reviewed CT scan findings. It is expected patient has lymphadenopathy considering history of CLL. She will make an appointment for her in one month from discharge.  Active problems Urinary tract infection - Evidence of UTI on urinalysis. Follow-up urine culture results. - Continue ciprofloxacin. Anemia of chronic disease / acute blood loss anemia - Fecal occult blood test positive on the  admission. No reports of blood in the stool. Hemoglobin was 9.9 on the admission and this morning 7.6 which could be dilutional from the IV fluids received since the admission. - Will follow up CBC in the morning. Transfuse if hemoglobin is less than 7. Thrombocytopenia - Likely secondary to history of lymphoma, bone marrow suppression - Were using SCDs for DVT prophylaxis - Platelet count down to 92 from the admission value of 113. Dementia / depression - Mental status stable - Continue Namenda 5 mg twice daily and Celexa 10 mg daily   DVT Prophylaxis  - SCD's bilaterally   Code Status: Full.  Family Communication:  plan of care discussed with the patient Disposition Plan: Home when stable.   IV access:   Peripheral IV  Procedures and diagnostic studies:    Ct Abdomen Pelvis W Contrast 04/16/2014   Hepatic and renal cysts.  Multiple inguinal, iliac and retroperitoneal lymph nodes most consistent with lymphoma. These have increased in the interval from the prior exam. Further evaluation is recommended as clinically indicated.  Changes in the right lower lobe which are likely related to sequela from prior inflammatory change.    Medical Consultants:   Dr. Heath Lark, oncology - phone call only   Other Consultants:   Physical therapy  Nutrition  IAnti-Infectives:    Cipro 04/16/2014 -->  Flagyl 04/16/2014 -->   [name], MD  [facility] Pager [phone]  If 7PM-7AM, please contact night-coverage [email or web address] Password [email or web address] 04/17/2014, 7:21 AM   LOS: 1 day    HPI/Subjective: No acute overnight events.  Objective: Filed Vitals:   04/16/14 2128 04/16/14 2244 04/17/14 0020 04/17/14 0448  BP:  107/53 82/44 109/36  Pulse:  100 102 73  Temp:  97.5 F (36.4 C) 98 F (36.7 C) 98.4 F (36.9 C)  TempSrc:  Oral Oral Oral  Resp:  16 20 18   Height:   5\' 2"  (1.575 m)   Weight:   68.5 kg (151 lb 0.2 oz)   SpO2: 100% 100% 100% 100%    Intake/Output  Summary (Last 24 hours) at 04/17/14 0721 Last data filed at 04/17/14 0534  Gross per 24 hour  Intake    675 ml  Output      0 ml  Net    675 ml    Exam:   General:  Pt is not in acute distress  Cardiovascular: Regular rate and rhythm, S1/S2 appreciated   Respiratory: Clear to auscultation bilaterally, no wheezing, no crackles, no rhonchi  Abdomen: Soft, non tender, non distended, bowel sounds present  Extremities: trace LE edema, pulses DP and PT palpable bilaterally  Neuro: Grossly nonfocal  Data Reviewed: Basic Metabolic Panel:  Recent Labs Lab 04/16/14 1624 04/17/14 0410  NA 138 138  K 4.3 4.1  CL 98 102  CO2 28 31  GLUCOSE 115* 133*  BUN 14 15  CREATININE 0.50 0.68  CALCIUM 9.3 8.6   Liver Function Tests:  Recent Labs Lab 04/16/14 1624 04/17/14 0410  AST 29 24  ALT 20 22  ALKPHOS 76 59  BILITOT 0.4 0.2*  PROT 6.7 5.1*  ALBUMIN 4.0 2.9*    Recent Labs Lab 04/16/14 1624  LIPASE 20   No results for input(s): AMMONIA in the last 168 hours. CBC:  Recent Labs Lab 04/16/14 1624 04/17/14 04/17/14 0410  WBC 21.7* 19.3* 13.7*  NEUTROABS 4.6  --  3.0  HGB 9.9* 8.6* 7.6*  HCT 31.1* 28.4* 25.0*  MCV 95.4 97.3 97.7  PLT 113* 105* 92*   Cardiac Enzymes: No results for input(s): CKTOTAL, CKMB, CKMBINDEX, TROPONINI in the last 168 hours. BNP: Invalid input(s): POCBNP CBG: No results for input(s): GLUCAP in the last 168 hours.  No results found for this or any previous visit (from the past 240 hour(s)).   Scheduled Meds: . ciprofloxacin  400 mg Intravenous Q12H  . citalopram  10 mg Oral Daily  . donepezil  5 mg Oral QHS  . memantine  5 mg Oral BID  . metronidazole  500 mg Intravenous Q8H  . multivitamin   1 tablet Oral Daily  . pantoprazole  40 mg Oral Daily   Continuous Infusions: . sodium chloride 50 mL/hr at 04/16/14 2358

## 2014-04-17 NOTE — Plan of Care (Signed)
Problem: Phase I Progression Outcomes Goal: OOB as tolerated unless otherwise ordered Outcome: Progressing     

## 2014-04-17 NOTE — Plan of Care (Signed)
Problem: Consults Goal: Skin Care Protocol Initiated - if Braden Score 18 or less If consults are not indicated, leave blank or document N/A  Outcome: Not Applicable Date Met:  04/17/14     

## 2014-04-18 DIAGNOSIS — D649 Anemia, unspecified: Secondary | ICD-10-CM

## 2014-04-18 DIAGNOSIS — K219 Gastro-esophageal reflux disease without esophagitis: Secondary | ICD-10-CM

## 2014-04-18 LAB — CBC
HCT: 26.7 % — ABNORMAL LOW (ref 36.0–46.0)
HEMOGLOBIN: 8 g/dL — AB (ref 12.0–15.0)
MCH: 29.6 pg (ref 26.0–34.0)
MCHC: 30 g/dL (ref 30.0–36.0)
MCV: 98.9 fL (ref 78.0–100.0)
PLATELETS: 92 10*3/uL — AB (ref 150–400)
RBC: 2.7 MIL/uL — AB (ref 3.87–5.11)
RDW: 16.4 % — ABNORMAL HIGH (ref 11.5–15.5)
WBC: 12.7 10*3/uL — ABNORMAL HIGH (ref 4.0–10.5)

## 2014-04-18 LAB — BASIC METABOLIC PANEL
ANION GAP: 7 (ref 5–15)
BUN: 15 mg/dL (ref 6–23)
CALCIUM: 8.6 mg/dL (ref 8.4–10.5)
CO2: 32 mEq/L (ref 19–32)
Chloride: 102 mEq/L (ref 96–112)
Creatinine, Ser: 0.63 mg/dL (ref 0.50–1.10)
GFR calc Af Amer: >90 mL/min (ref >90)
GFR, EST NON AFRICAN AMERICAN: 78 mL/min — AB (ref >90)
Glucose, Bld: 144 mg/dL — ABNORMAL HIGH (ref 70–99)
POTASSIUM: 4.3 meq/L (ref 3.7–5.3)
Sodium: 141 mEq/L (ref 137–147)

## 2014-04-18 LAB — PATHOLOGIST SMEAR REVIEW

## 2014-04-18 MED ORDER — ENSURE COMPLETE PO LIQD
237.0000 mL | Freq: Two times a day (BID) | ORAL | Status: DC
  Administered 2014-04-20 (×2): 237 mL via ORAL

## 2014-04-18 MED ORDER — GI COCKTAIL ~~LOC~~
30.0000 mL | Freq: Once | ORAL | Status: AC
  Administered 2014-04-18: 30 mL via ORAL
  Filled 2014-04-18: qty 30

## 2014-04-18 MED ORDER — INFLUENZA VAC SPLIT QUAD 0.5 ML IM SUSY
0.5000 mL | PREFILLED_SYRINGE | INTRAMUSCULAR | Status: AC
  Administered 2014-04-20: 0.5 mL via INTRAMUSCULAR
  Filled 2014-04-18 (×2): qty 0.5

## 2014-04-18 MED ORDER — LEVOFLOXACIN IN D5W 750 MG/150ML IV SOLN
750.0000 mg | INTRAVENOUS | Status: DC
  Administered 2014-04-18 – 2014-04-20 (×2): 750 mg via INTRAVENOUS
  Filled 2014-04-18 (×3): qty 150

## 2014-04-18 NOTE — Progress Notes (Signed)
ANTIBIOTIC CONSULT NOTE - INITIAL  Pharmacy Consult for levofloxacin Indication: intra-abdominal infection, UTI, respiratory coverage  Allergies  Allergen Reactions  . Pseudoephedrine Other (See Comments)    insomnia  . Codeine     REACTION: nausea (mild)  . Doxycycline     REACTION: Nausea  . Nsaids     REACTION: GI bleed/anemia  . Solifenacin Succinate     REACTION: blurred vision    Patient Measurements: Height: 5\' 2"  (157.5 cm) Weight: 151 lb 0.2 oz (68.5 kg) IBW/kg (Calculated) : 50.1  Assessment: 89 yoF admitted 11/18 with intra-abdominal infection.  Hx diverticulosis, recurrent UTIs, CLL, dementia. Pharmacy originally consulted to dose ciprofloxacin, MD dosing metronidazole. Ciprofloxacin is now being changed to levofloxacin for improved respiratory coverage.  Chest Xray on 11/19 showed small L pleural effusion with L basilar atelectasis or infiltrate  Antiinfectives 11/18 >> ciprofloxain >> 11/20 11/18 >> metronidazole >>   11/20 >> levofloxacin >>  Labs / vitals Tmax: remains afebrile WBCs: improved to 12.7 (21.7 on admit) Renal: SCr 0.63- stable (baseline 0.6-0.7), CrCl 44 ml/min CG  Microbiology 11/18 urine: IP (UA + for UTI)   Goal of Therapy:  levofloxacin per indication and renal function  Plan:  - discontinue ciprofloxacin - start levofloxacin 750mg  IV q48h - continue metronidazole per MD - follow-up clinical course, culture results, renal function - follow-up antibiotic de-escalation and length of therapy  Thank you for the consult.  Currie Paris, PharmD, BCPS Pager: [phone] Pharmacy: [phone] 04/18/2014 1:38 PM

## 2014-04-18 NOTE — Progress Notes (Signed)
INITIAL NUTRITION ASSESSMENT  Pt meets criteria for mild/moderate MALNUTRITION in the context of chronic as evidenced by intake <75% for > 1 month and decreased muscle mass.  DOCUMENTATION CODES Per approved criteria  -Non-severe (moderate) malnutrition in the context of chronic illness   INTERVENTION: Ensure Complete po BID, each supplement provides 350 kcal and 13 grams of protein Continue Regular diet Encourage intake For SNF when stable RD to follow  NUTRITION DIAGNOSIS: Inadequate oral intake related to poor appetite and dementia as evidenced by observation.   Goal: Intake of meals and supplements to meet >90% estimated needs.  Monitor:  Intake, labs, weight trend  Reason for Assessment: Consult  80 y.o. female  Admitting Dx: Rectal pain  ASSESSMENT: Patient with leukocytosis with hx of CLL and NHL found to have UTI and rectal pain concerning for intra-abdominal infection, possible pneumonia, on O2.  Hx includes dementia and diverticulosis.  Lunch untouched in room Patient pleasantly confused Does not want to eat.  Poor appetite.  Nutrition Focused Physical Exam:  Subcutaneous Fat:  Orbital Region: wnl Upper Arm Region: wnl Thoracic and Lumbar Region: n/a  Muscle:  Temple Region: moderate Clavicle Bone Region: wnl Clavicle and Acromion Bone Region: severe Scapular Bone Region: mild/moderate Dorsal Hand: mild Patellar Region: mild Anterior Thigh Region: n/a Posterior Calf Region: n/a  Edema: not noted    Height: Ht Readings from Last 1 Encounters:  04/17/14 5\' 2"  (1.575 m)    Weight: Wt Readings from Last 1 Encounters:  04/17/14 151 lb 0.2 oz (68.5 kg)    Ideal Body Weight: 110 lbs  % Ideal Body Weight: 137  Wt Readings from Last 10 Encounters:  04/17/14 151 lb 0.2 oz (68.5 kg)  02/28/14 149 lb 3.2 oz (67.677 kg)  02/06/14 150 lb 8 oz (68.266 kg)  01/10/14 149 lb 6.4 oz (67.767 kg)  10/25/13 155 lb 8 oz (70.534 kg)  10/11/13 155 lb 8  oz (70.534 kg)  08/04/13 163 lb 12.8 oz (74.3 kg)  06/28/13 158 lb 3.2 oz (71.759 kg)  04/12/13 163 lb 4 oz (74.05 kg)  10/26/12 163 lb 8 oz (74.163 kg)    Usual Body Weight: 163 lbs 8 months ago  % Usual Body Weight: 93  BMI:  Body mass index is 27.61 kg/(m^2).  Estimated Nutritional Needs: Kcal: 1650-1750 Protein: 60-70 gm Fluid: >1.7L daily  Skin: intact  Diet Order: Diet regular  EDUCATION NEEDS: -No education needs identified at this time   Intake/Output Summary (Last 24 hours) at 04/18/14 1649 Last data filed at 04/18/14 0927  Gross per 24 hour  Intake   1206 ml  Output      0 ml  Net   1206 ml    Labs:   Recent Labs Lab 04/16/14 1624 04/17/14 0410 04/18/14 0447  NA 138 138 141  K 4.3 4.1 4.3  CL 98 102 102  CO2 28 31 32  BUN 14 15 15   CREATININE 0.50 0.68 0.63  CALCIUM 9.3 8.6 8.6  GLUCOSE 115* 133* 144*    CBG (last 3)  No results for input(s): GLUCAP in the last 72 hours.  Scheduled Meds: . antiseptic oral rinse  7 mL Mouth Rinse BID  . brimonidine  1 drop Both Eyes BID   And  . timolol  1 drop Both Eyes BID  . brinzolamide  1 drop Left Eye TID  . citalopram  10 mg Oral Daily  . donepezil  5 mg Oral QHS  . hydrocortisone  Rectal TID  . latanoprost  1 drop Left Eye QHS  . levofloxacin (LEVAQUIN) IV  750 mg Intravenous Q48H  . memantine  5 mg Oral BID  . metronidazole  500 mg Intravenous Q8H  . multivitamin with minerals  1 tablet Oral Daily  . pantoprazole  40 mg Oral Daily    Continuous Infusions:   Past Medical History  Diagnosis Date  . Dementia 01/25/2011  . CLL 09/22/2009  . Personal history of malignant neoplasm of breast 09/22/2009  . ANEMIA-IRON DEFICIENCY 04/09/2007  . Bronchiectasis without acute exacerbation 01/07/2009  . GERD 02/26/2008  . HYPERLIPIDEMIA 04/09/2007  . OBSTRUCTIVE SLEEP APNEA 04/22/2008  . Pancytopenia 03/19/2009  . Recurrent UTI 01/25/2011  . DIVERTICULOSIS, COLON 04/15/2010  . GLAUCOMA 04/09/2007   . 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 OSTEOPOROSIS 03/19/2009  . RECTOCELE WITHOUT MENTION OF UTERINE PROLAPSE 04/15/2010  . Depression 05/03/2012  . Stage I breast cancer 06/29/2012    Left breast stage 1 dx 08/1996;  ER positive Rx lumpectomy/RT/Tamoxifen    Past Surgical History  Procedure Laterality Date  . Breast surgery      lumpectomy  . Abdominal hysterectomy    . Rotater cuff      Antonieta Iba, RD, LDN Clinical Inpatient Dietitian Pager:  [phone] Weekend and after hours pager:  [phone]

## 2014-04-18 NOTE — Progress Notes (Signed)
Clinical Social Work Department BRIEF PSYCHOSOCIAL ASSESSMENT 04/18/2014  Patient:  Audrey Spencer, Audrey Spencer     Account Number:  [id number]     Admit date:  04/16/2014  Clinical Social Worker:  Audrey Spencer  Date/Time:  04/18/2014 10:00 AM  Referred by:  Physician  Date Referred:  04/18/2014 Referred for  SNF Placement   Other Referral:   Interview type:  Patient Other interview type:   and patient family    PSYCHOSOCIAL DATA Living Status:  ALONE Admitted from facility:   Level of care:   Primary support name:  Audrey Spencer Bostic/granddaughter/[phone] Primary support relationship to patient:  FAMILY Degree of support available:   strong    CURRENT CONCERNS Current Concerns  Post-Acute Placement   Other Concerns:    SOCIAL WORK ASSESSMENT / PLAN CSW received referral for New SNF.     CSW met with pt at bedside. CSW introduced self and explained role. Pt confirmed that she lives alone, however she stated that she has a strong support system around her, including her granddaughter, Audrey Spencer, who lives behind her and her niece, Audrey Spencer who lives around the corner.     CSW discussed PT's SNF recommendation and pt acknowledged that she would not be safe to go home, but states "I don't care" in regard to SNF for rehab. She stated that she does not want to make decisions and provided permission for CSW to contact pt granddaughter and pt niece to discusss and pt states that she will defer decisions to them. CSW expressed understanding and provided support.        CSW contacted pt granddaughter, Audrey Spencer via telephone.  Pt granddaughter voice message was full and CSW was unable to leave a message.    CSW contacted pt niece, Audrey Spencer via telephone. CSW introduced self and explained role. CSW discussed with pt niece recommendation for SNF for rehab. Pt niece discussed that she feels that pt definitely needs rehab prior to returning home. CSW provided supportive listening as pt niece discussed that  pt often does not realize how much help she needs, but pt niece and pt granddaughter had discussed that pt would definitely need rehab following hospitalization. Pt niece shared that pt granddaughter was in a college lecture, but would likely return call once she was finished. Pt niece shared that pt has been at [facility] in the past and seemed happy in that facility and pt family would be interested in [facility]. CSW expressed understanding.    CSW completed FL2 and initiated SNF search to [facility]. CSW contacted [facility] to notify facility of pt interest in facility, awaiting return phone call.    CSW to follow up with pt and pt family regarding SNF bed offers.    CSW to continue to follow to provide support and facilitate pt discharge needs when pt medically ready for discharge.   Assessment/plan status:  Psychosocial Support/Ongoing Assessment of Needs Other assessment/ plan:   discharge planning   Information/referral to community resources:   [facility] list    PATIENT'S/FAMILY'S RESPONSE TO PLAN OF CARE: Pt alert and oriented x4. Pt expressed that she does not feel well today and preferred CSW to communicate with pt granddaughter and pt niece regarding disposition planning. Pt has strong support from pt niece and pt granddaughter and they are actively involved in pt care. Pt niece expressed wanting to do what was in the best interest of the pt, but discussed that often it is hard for pt to realize that  she needs more care.    Audrey Spencer, MSW, [place] [facility] [phone]

## 2014-04-18 NOTE — Progress Notes (Addendum)
Progress Note   Audrey Spencer PNT:[id number] DOB: Oct 15, 1922 DOA: 04/16/2014 PCP: [name], MD   Brief Narrative:   Audrey Spencer is an 80 y.o. female with a PMH of CLL in remission, OSA noncompliant with C Pap, dementia and depression who was admitted 04/16/14 with worsening rectal pain. CT scan of the abdomen and pelvis done on admission showed multiple inguinal, iliac and retroperitoneal lymph nodes concerning for lymphoma. WBC was 21.7, hemoglobin 9.9, and platelets 113. Urinalysis showed findings consistent with a UTI.  Assessment/Plan:   Principal Problem:   Rectal pain with leukocytosis in a patient with a history of CLL and NHL  Patient was placed on empiric Cipro/Flagyl given UTI, rectal pain concerning for intra-abdominal infection. WBC improved.  Continue Anusol rectal cream.  Follow-up with Dr. Alvy Bimler.  Active Problems:   Possible left lower lobe pneumonia  Chest x-ray findings show a left lower lobe infiltrate versus atelectasis.  Broaden antibiotics from Cipro to Levaquin.   GERD  GI cocktail now.  Continue Protonix.    Depression  Continue Celexa.    Dementia  Continue Aricept and Namenda.    UTI (lower urinary tract infection)  Continue empiric quinolone.  F/U urine culture.    Normocytic anemia  Likely secondary to anemia of chronic disease given her known history of CLL.  Fecal occult blood testing was positive.  Hemoglobin stable at 8 mg/dL. Up from 7.6 yesterday.    DVT Prophylaxis  SCDs ordered.  Code Status: Full. Family Communication: No family at the bedside.  Sheppard Evens (niece) updated by telephone [phone]. Disposition Plan: SNF when stable.   IV Access:    Peripheral IV   Procedures and diagnostic studies:   Ct Abdomen Pelvis W Contrast 04/16/2014 Hepatic and renal cysts. Multiple inguinal, iliac and retroperitoneal lymph nodes most consistent with lymphoma. These have increased in the interval from  the prior exam. Further evaluation is recommended as clinically indicated. Changes in the right lower lobe which are likely related to sequela from prior inflammatory change.  Dg Chest Port 1 View 04/17/2014: Tiny left pleural effusion with left basilar atelectasis or infiltrate. No pulmonary edema. Degenerative changes right shoulder.     Medical Consultants:    None.  Anti-Infectives:    Cipro 04/16/2014 --> 04/18/14  Levaquin 04/18/14--->  Flagyl 04/16/2014 -->  Subjective:   Audrey Spencer is belching and feels unwell. She tells me she has some acid indigestion but denies chest pain. Very slow to answer any of my questions.  Objective:    Filed Vitals:   04/17/14 0448 04/17/14 1449 04/17/14 2128 04/18/14 0430  BP: 109/36 120/49 118/50 93/70  Pulse: 73 85 92 90  Temp: 98.4 F (36.9 C) 99.4 F (37.4 C) 98.9 F (37.2 C) 98.6 F (37 C)  TempSrc: Oral Oral Oral Oral  Resp: 18 18 18 16   Height:      Weight:      SpO2: 100% 99% 99% 99%    Intake/Output Summary (Last 24 hours) at 04/18/14 0754 Last data filed at 04/18/14 0522  Gross per 24 hour  Intake   1206 ml  Output      0 ml  Net   1206 ml    Exam: Gen:  Lethargic/restless Cardiovascular:  RRR, No M/R/G Respiratory:  Lungs CTAB Gastrointestinal:  Abdomen soft, NT/ND, + BS Extremities:  No C/E/C   Data Reviewed:    Labs: Basic Metabolic Panel:  Recent Labs Lab 04/16/14 1624 04/17/14 0410  04/18/14 0447  NA 138 138 141  K 4.3 4.1 4.3  CL 98 102 102  CO2 28 31 32  GLUCOSE 115* 133* 144*  BUN 14 15 15   CREATININE 0.50 0.68 0.63  CALCIUM 9.3 8.6 8.6   GFR Estimated Creatinine Clearance: 44.1 mL/min (by C-G formula based on Cr of 0.63). Liver Function Tests:  Recent Labs Lab 04/16/14 1624 04/17/14 0410  AST 29 24  ALT 20 22  ALKPHOS 76 59  BILITOT 0.4 0.2*  PROT 6.7 5.1*  ALBUMIN 4.0 2.9*    Recent Labs Lab 04/16/14 1624  LIPASE 20   CBC:  Recent Labs Lab  04/16/14 1624 04/17/14 04/17/14 0410 04/18/14 0447  WBC 21.7* 19.3* 13.7* 12.7*  NEUTROABS 4.6  --  3.0  --   HGB 9.9* 8.6* 7.6* 8.0*  HCT 31.1* 28.4* 25.0* 26.7*  MCV 95.4 97.3 97.7 98.9  PLT 113* 105* 92* 92*   Sepsis Labs:  Recent Labs Lab 04/16/14 1624 04/16/14 2209 04/17/14 04/17/14 0410 04/18/14 0447  WBC 21.7*  --  19.3* 13.7* 12.7*  LATICACIDVEN  --  0.88  --   --   --    Microbiology No results found for this or any previous visit (from the past 240 hour(s)).   Medications:   . antiseptic oral rinse  7 mL Mouth Rinse BID  . brimonidine  1 drop Both Eyes BID   And  . timolol  1 drop Both Eyes BID  . brinzolamide  1 drop Left Eye TID  . ciprofloxacin  400 mg Intravenous Q12H  . citalopram  10 mg Oral Daily  . donepezil  5 mg Oral QHS  . hydrocortisone   Rectal TID  . latanoprost  1 drop Left Eye QHS  . memantine  5 mg Oral BID  . metronidazole  500 mg Intravenous Q8H  . multivitamin with minerals  1 tablet Oral Daily  . pantoprazole  40 mg Oral Daily   Continuous Infusions:   Time spent: 25 minutes.   LOS: 2 days   RAMA,CHRISTINA  [facility] Pager [phone]. If unable to reach me by pager, please call my cell phone at [phone].  *Please refer to [email or web address], password [email or web address] to get updated schedule on who will round on this patient, as hospitalists switch teams weekly. If 7PM-7AM, please contact night-coverage at [email or web address], password [email or web address] for any overnight needs.  04/18/2014, 7:54 AM    Information printed out and reviewed with the patient/family:     In an effort to keep you and your family informed about your hospital stay, I am providing you with this information sheet. If you or your family have any questions, please do not hesitate to have the nursing staff page me to set up a meeting time.  Also note that the hospitalist doctors typically change on Tuesdays or Wednesdays to a different hospitalist doctor.  Reilly L  Faulks 04/18/2014 2 (Number of days in the hospital)  Treatment team:  Dr. Jacquelynn Cree, Hospitalist (Internist)   Pertinent labs / studies:  CT scan done 04/16/14 showed abnormal enlarged lymph nodes in the groin and pelvis.  Chest x-ray showed subtle changes on the left side, possibly due to pneumonia.  Principle Diagnosis:  History of CLL, abnormally enlarged lymph nodes which may be causing rectal pain, rule out infection, urinary tract infection.   Plan for today:  Continue antibiotics: Levaquin and Flagyl.  Follow-up with Dr. Alvy Bimler as an outpatient.  Anticipated discharge date: 1-2 days.

## 2014-04-18 NOTE — Progress Notes (Addendum)
Clinical Social Work Department CLINICAL SOCIAL WORK PLACEMENT NOTE 04/18/2014  Patient:  YAVONNE, KISS  Account Number:  [id number] Admit date:  04/16/2014  Clinical Social Worker:  Maryln Manuel  Date/time:  04/18/2014 10:40 AM  Clinical Social Work is seeking post-discharge placement for this patient at the following level of care:   SKILLED NURSING   (*CSW will update this form in Epic as items are completed)   04/18/2014  Patient/family provided with [place] Department of Clinical Social Work's list of facilities offering this level of care within the geographic area requested by the patient (or if unable, by the patient's family).  04/18/2014  Patient/family informed of their freedom to choose among providers that offer the needed level of care, that participate in Medicare, Medicaid or managed care program needed by the patient, have an available bed and are willing to accept the patient.  04/18/2014  Patient/family informed of [facility]' ownership interest in [facility], as well as of the fact that they are under no obligation to receive care at this facility.  PASARR submitted to EDS on 04/18/2014 PASARR number received on 04/18/2014  FL2 transmitted to all facilities in geographic area requested by pt/family on  04/18/2014 FL2 transmitted to all facilities within larger geographic area on   Patient informed that his/her managed care company has contracts with or will negotiate with  certain facilities, including the following:     Patient/family informed of bed offers received:  04/21/2014 Patient chooses bed at [facility] Physician recommends and patient chooses bed at    Patient to be transferred to  on  [facility] on 04/22/2014 Patient to be transferred to facility by ambulance ([facility]) Patient and family notified of transfer on 04/22/2014 Name of family member notified:  Pt notified at bedside, pt granddaughter, Evelena Peat notified via  telephone  The following physician request were entered in Epic:   Additional Comments:   [name], MSW, [place] Work [phone]

## 2014-04-19 ENCOUNTER — Inpatient Hospital Stay (HOSPITAL_COMMUNITY): Payer: Medicare Other

## 2014-04-19 DIAGNOSIS — K5901 Slow transit constipation: Secondary | ICD-10-CM

## 2014-04-19 DIAGNOSIS — K5900 Constipation, unspecified: Secondary | ICD-10-CM | POA: Diagnosis present

## 2014-04-19 DIAGNOSIS — E440 Moderate protein-calorie malnutrition: Secondary | ICD-10-CM | POA: Diagnosis present

## 2014-04-19 LAB — URINE CULTURE

## 2014-04-19 LAB — CBC
HCT: 26.3 % — ABNORMAL LOW (ref 36.0–46.0)
Hemoglobin: 8 g/dL — ABNORMAL LOW (ref 12.0–15.0)
MCH: 30.1 pg (ref 26.0–34.0)
MCHC: 30.4 g/dL (ref 30.0–36.0)
MCV: 98.9 fL (ref 78.0–100.0)
Platelets: 93 10*3/uL — ABNORMAL LOW (ref 150–400)
RBC: 2.66 MIL/uL — AB (ref 3.87–5.11)
RDW: 16.6 % — AB (ref 11.5–15.5)
WBC: 15.2 10*3/uL — ABNORMAL HIGH (ref 4.0–10.5)

## 2014-04-19 LAB — BASIC METABOLIC PANEL
ANION GAP: 7 (ref 5–15)
BUN: 15 mg/dL (ref 6–23)
CHLORIDE: 101 meq/L (ref 96–112)
CO2: 32 meq/L (ref 19–32)
Calcium: 8.6 mg/dL (ref 8.4–10.5)
Creatinine, Ser: 0.62 mg/dL (ref 0.50–1.10)
GFR calc non Af Amer: 78 mL/min — ABNORMAL LOW (ref >90)
Glucose, Bld: 119 mg/dL — ABNORMAL HIGH (ref 70–99)
POTASSIUM: 3.9 meq/L (ref 3.7–5.3)
SODIUM: 140 meq/L (ref 137–147)

## 2014-04-19 MED ORDER — POLYETHYLENE GLYCOL 3350 17 G PO PACK
17.0000 g | PACK | Freq: Every day | ORAL | Status: DC
  Administered 2014-04-19 – 2014-04-21 (×3): 17 g via ORAL
  Filled 2014-04-19 (×4): qty 1

## 2014-04-19 MED ORDER — FUROSEMIDE 10 MG/ML IJ SOLN
40.0000 mg | Freq: Once | INTRAMUSCULAR | Status: AC
  Administered 2014-04-19: 40 mg via INTRAVENOUS
  Filled 2014-04-19 (×2): qty 4

## 2014-04-19 MED ORDER — CALCIUM CARBONATE ANTACID 500 MG PO CHEW
1.0000 | CHEWABLE_TABLET | Freq: Three times a day (TID) | ORAL | Status: DC | PRN
  Administered 2014-04-20: 200 mg via ORAL
  Filled 2014-04-19: qty 1

## 2014-04-19 MED ORDER — IOHEXOL 350 MG/ML SOLN
100.0000 mL | Freq: Once | INTRAVENOUS | Status: AC | PRN
  Administered 2014-04-19: 100 mL via INTRAVENOUS

## 2014-04-19 MED ORDER — MAGNESIUM HYDROXIDE 400 MG/5ML PO SUSP: 30.0000 mL | Freq: Every day | ORAL | Status: DC | PRN

## 2014-04-19 NOTE — Progress Notes (Addendum)
Progress Note   Audrey Spencer [id number]:[id number] DOB: April 16, 1923 DOA: 04/16/2014 PCP: [name], MD   Brief Narrative:   Audrey Spencer is an 80 y.o. female with a PMH of CLL in remission, OSA noncompliant with C Pap, dementia and depression who was admitted 04/16/14 with worsening rectal pain. CT scan of the abdomen and pelvis done on admission showed multiple inguinal, iliac and retroperitoneal lymph nodes concerning for lymphoma. WBC was 21.7, hemoglobin 9.9, and platelets 113. Urinalysis showed findings consistent with a UTI.  Assessment/Plan:   Principal Problem:   Rectal pain with leukocytosis in a patient with a history of CLL and NHL  Patient was placed on empiric Cipro/Flagyl given UTI, rectal pain concerning for intra-abdominal infection. WBC improved.  Continue Anusol rectal cream.  Follow-up with Dr. Alvy Bimler.  Active Problems:   Moderate malnutrition  Pt meets criteria for mild/moderate MALNUTRITION in the context of chronic as evidenced by intake <75% for > 1 month and decreased muscle mass.  Ensure BID per RD recommendations.    Constipation  Start MiraLAX.  MOM PRN.    Possible left lower lobe pneumonia  Chest x-ray findings show a left lower lobe infiltrate versus atelectasis (may be scarring from prior XRT).  Continue Levaquin.  Given worsening dyspnea, will get CT chest to R/O PE and to further evaluate LLL.   GERD  Continue Protonix.    Depression  Continue Celexa.    Dementia  Continue Aricept and Namenda.    UTI (lower urinary tract infection)  Continue empiric quinolone.  F/U urine culture, preliminary report growing GNR.    Normocytic anemia  Likely secondary to anemia of chronic disease given her known history of CLL.  Fecal occult blood testing was positive.  Hemoglobin stable at 8 mg/dL.     DVT Prophylaxis  SCDs ordered.  Code Status: Full. Family Communication: No family at the bedside.  Audrey Spencer (niece)  updated by telephone [phone]. Disposition Plan: SNF when stable.   IV Access:    Peripheral IV   Procedures and diagnostic studies:   Ct Abdomen Pelvis W Contrast 04/16/2014 Hepatic and renal cysts. Multiple inguinal, iliac and retroperitoneal lymph nodes most consistent with lymphoma. These have increased in the interval from the prior exam. Further evaluation is recommended as clinically indicated. Changes in the right lower lobe which are likely related to sequela from prior inflammatory change.  Dg Chest Port 1 View 04/17/2014: Tiny left pleural effusion with left basilar atelectasis or infiltrate. No pulmonary edema. Degenerative changes right shoulder.     Medical Consultants:    None.  Anti-Infectives:    Cipro 04/16/2014 --> 04/18/14  Levaquin 04/18/14--->  Flagyl 04/16/2014 -->  Subjective:   Audrey Spencer is still dypneic.  No complaints of rectal pain.  No cough.  Continues to be a bit lethargic, but more awake than yesterday.  Objective:    Filed Vitals:   04/18/14 1157 04/18/14 1422 04/18/14 2043 04/19/14 0508  BP:  110/52 128/49 109/50  Pulse:  89 78 85  Temp: 98.9 F (37.2 C) 98.7 F (37.1 C) 97.8 F (36.6 C) 98.1 F (36.7 C)  TempSrc: Oral Oral Oral Oral  Resp:  15 16 16   Height:      Weight:      SpO2:  99% 99% 99%    Intake/Output Summary (Last 24 hours) at 04/19/14 0926 Last data filed at 04/19/14 0509  Gross per 24 hour  Intake    540 ml  Output      0 ml  Net    540 ml    Exam: Gen:  Awake but slow to respond Cardiovascular:  RRR, No M/R/G Respiratory:  Lungs with upper airway high pitched wheeze Gastrointestinal:  Abdomen soft, NT/ND, + BS Extremities:  No C/E/C   Data Reviewed:    Labs: Basic Metabolic Panel:  Recent Labs Lab 04/16/14 1624 04/17/14 0410 04/18/14 0447 04/19/14 0507  NA 138 138 141 140  K 4.3 4.1 4.3 3.9  CL 98 102 102 101  CO2 28 31 32 32  GLUCOSE 115* 133* 144* 119*  BUN 14 15 15 15     CREATININE 0.50 0.68 0.63 0.62  CALCIUM 9.3 8.6 8.6 8.6   GFR Estimated Creatinine Clearance: 44.1 mL/min (by C-G formula based on Cr of 0.62). Liver Function Tests:  Recent Labs Lab 04/16/14 1624 04/17/14 0410  AST 29 24  ALT 20 22  ALKPHOS 76 59  BILITOT 0.4 0.2*  PROT 6.7 5.1*  ALBUMIN 4.0 2.9*    Recent Labs Lab 04/16/14 1624  LIPASE 20   CBC:  Recent Labs Lab 04/16/14 1624 04/17/14 04/17/14 0410 04/18/14 0447 04/19/14 0507  WBC 21.7* 19.3* 13.7* 12.7* 15.2*  NEUTROABS 4.6  --  3.0  --   --   HGB 9.9* 8.6* 7.6* 8.0* 8.0*  HCT 31.1* 28.4* 25.0* 26.7* 26.3*  MCV 95.4 97.3 97.7 98.9 98.9  PLT 113* 105* 92* 92* 93*   Sepsis Labs:  Recent Labs Lab 04/16/14 2209 04/17/14 04/17/14 0410 04/18/14 0447 04/19/14 0507  WBC  --  19.3* 13.7* 12.7* 15.2*  LATICACIDVEN 0.88  --   --   --   --    Microbiology Recent Results (from the past 240 hour(s))  Culture, Urine     Status: None (Preliminary result)   Collection Time: 04/16/14  5:08 PM  Result Value Ref Range Status   Specimen Description URINE, CLEAN CATCH  Final   Special Requests NONE  Final   Culture  Setup Time   Final    04/17/2014 13:27 Performed at [place]   Final    >=100,000 COLONIES/ML Performed at [facility] Performed at [facility]    Report Status PENDING  Incomplete     Medications:   . antiseptic oral rinse  7 mL Mouth Rinse BID  . brimonidine  1 drop Both Eyes BID   And  . timolol  1 drop Both Eyes BID  . brinzolamide  1 drop Left Eye TID  . citalopram  10 mg Oral Daily  . donepezil  5 mg Oral QHS  . feeding supplement (ENSURE COMPLETE)  237 mL Oral BID BM  . hydrocortisone   Rectal TID  . Influenza vac split quadrivalent PF  0.5 mL Intramuscular Tomorrow-1000  . latanoprost  1 drop Left Eye QHS  . levofloxacin (LEVAQUIN) IV  750 mg Intravenous Q48H  . memantine  5 mg Oral BID  .  metronidazole  500 mg Intravenous Q8H  . multivitamin with minerals  1 tablet Oral Daily  . pantoprazole  40 mg Oral Daily   Continuous Infusions:   Time spent: 25 minutes.   LOS: 3 days   [facility] Pager [phone]. If unable to reach me by pager, please call my cell phone at [phone].  *Please refer to [email or web address], password [email or web address] to get updated  schedule on who will round on this patient, as hospitalists switch teams weekly. If 7PM-7AM, please contact night-coverage at [email or web address], password [email or web address] for any overnight needs.  04/19/2014, 9:26 AM    Information printed out and reviewed with the patient/family:     In an effort to keep you and your family informed about your hospital stay, I am providing you with this information sheet. If you or your family have any questions, please do not hesitate to have the nursing staff page me to set up a meeting time.  Also note that the hospitalist doctors typically change on Tuesdays or Wednesdays to a different hospitalist doctor.  Audrey Spencer 04/19/2014 3 (Number of days in the hospital)  Treatment team:  Dr. Jacquelynn Cree, Hospitalist (Internist)   Pertinent labs / studies:  CT scan done 04/16/14 showed abnormal enlarged lymph nodes in the groin and pelvis.  Chest x-ray done 04/17/14 showed subtle changes on the left side, possibly due to pneumonia.  Principle Diagnosis:  History of CLL, abnormally enlarged lymph nodes which may be causing rectal pain, rule out infection, urinary tract infection.   Plan for today:  Continue antibiotics: Levaquin and Flagyl.  Given ongoing shortness of breath noticed by nursing staff, will get a CT of the chest today to rule out blood clots in the lungs.  Follow-up with Dr. Alvy Bimler as an outpatient.  Anticipated discharge date: 1-2 days.

## 2014-04-19 NOTE — Plan of Care (Signed)
Problem: Consults Goal: Nutrition Consult-if indicated Outcome: Completed/Met Date Met:  04/19/14 Goal: Diabetes Guidelines if Diabetic/Glucose > 140 If diabetic or lab glucose is > 140 mg/dl - Initiate Diabetes/Hyperglycemia Guidelines & Document Interventions  Outcome: Not Applicable Date Met:  49/61/16  Problem: Phase I Progression Outcomes Goal: Pain controlled with appropriate interventions Outcome: Completed/Met Date Met:  04/19/14 Goal: Initial discharge plan identified Outcome: Completed/Met Date Met:  04/19/14

## 2014-04-19 NOTE — Progress Notes (Signed)
Shift Note: Pt's WOB increased this morning.  Exp wheezing in upper lobes.  MD made aware of breathing difficulties.  CT ordered.  Order for Lasix given, pt incontinent and had increased WOB rolling in bed to clean up incontinent episodes.  Foley placed per MD order with second RN present.  Pt tolerated sterile insertion of foley well.  Since Lasix given, pt has had 2000cc clear yellow urine output.

## 2014-04-20 DIAGNOSIS — N390 Urinary tract infection, site not specified: Secondary | ICD-10-CM | POA: Diagnosis not present

## 2014-04-20 LAB — BASIC METABOLIC PANEL
Anion gap: 6 (ref 5–15)
BUN: 15 mg/dL (ref 6–23)
CO2: 37 mEq/L — ABNORMAL HIGH (ref 19–32)
CREATININE: 0.76 mg/dL (ref 0.50–1.10)
Calcium: 8.6 mg/dL (ref 8.4–10.5)
Chloride: 98 mEq/L (ref 96–112)
GFR calc non Af Amer: 73 mL/min — ABNORMAL LOW (ref >90)
GFR, EST AFRICAN AMERICAN: 85 mL/min — AB (ref >90)
GLUCOSE: 106 mg/dL — AB (ref 70–99)
POTASSIUM: 3.8 meq/L (ref 3.7–5.3)
Sodium: 141 mEq/L (ref 137–147)

## 2014-04-20 LAB — CBC
HEMATOCRIT: 27.6 % — AB (ref 36.0–46.0)
HEMOGLOBIN: 8.3 g/dL — AB (ref 12.0–15.0)
MCH: 30.2 pg (ref 26.0–34.0)
MCHC: 30.1 g/dL (ref 30.0–36.0)
MCV: 100.4 fL — AB (ref 78.0–100.0)
Platelets: 86 10*3/uL — ABNORMAL LOW (ref 150–400)
RBC: 2.75 MIL/uL — ABNORMAL LOW (ref 3.87–5.11)
RDW: 16.5 % — AB (ref 11.5–15.5)
WBC: 17.6 10*3/uL — ABNORMAL HIGH (ref 4.0–10.5)

## 2014-04-20 NOTE — Plan of Care (Signed)
Problem: Phase II Progression Outcomes Goal: IV changed to normal saline lock Outcome: Completed/Met Date Met:  04/20/14     

## 2014-04-20 NOTE — Progress Notes (Signed)
Progress Note   Audrey Spencer JAS:[id number] DOB: 01/09/1923 DOA: 04/16/2014 PCP: [name], MD   Brief Narrative:   Audrey Spencer is an 80 y.o. female with a PMH of CLL in remission, OSA noncompliant with C Pap, dementia and depression who was admitted 04/16/14 with worsening rectal pain. CT scan of the abdomen and pelvis done on admission showed multiple inguinal, iliac and retroperitoneal lymph nodes concerning for lymphoma. WBC was 21.7, hemoglobin 9.9, and platelets 113. Urinalysis showed findings consistent with a UTI.  Assessment/Plan:   Principal Problem:   Rectal pain with leukocytosis in a patient with a history of CLL and NHL  Patient was placed on empiric Cipro/Flagyl given UTI, rectal pain concerning for intra-abdominal infection. WBC improved.  Continue Anusol rectal cream.  Follow-up with Dr. Alvy Bimler.  Active Problems:   Moderate malnutrition  Pt meets criteria for mild/moderate MALNUTRITION in the context of chronic as evidenced by intake <75% for > 1 month and decreased muscle mass.  Ensure BID per RD recommendations.    Constipation  Start MiraLAX.  MOM PRN.  Bowels now moving.    Possible left lower lobe pneumonia  Chest x-ray findings show a left lower lobe infiltrate versus atelectasis (may be scarring from prior XRT).  Continue Levaquin.  CT of the chest done 04/19/14. No evidence of PE. Mediastinal adenopathy consistent with patient's history of CLL noted. Stable cardiomegaly.   GERD  Continue Protonix.    Depression  Continue Celexa.    Dementia  Continue Aricept and Namenda.    UTI (lower urinary tract infection)  Cultures positive for Enterobacter, Levaquin sensitive.    Normocytic anemia  Likely secondary to anemia of chronic disease given her known history of CLL.  Fecal occult blood testing was positive.  Hemoglobin stable at 8 mg/dL.     DVT Prophylaxis  SCDs ordered.  Code Status: Full. Family  Communication: No family at the bedside.  Donnel Saxon updated by telephone (granddaughter) [phone]. Disposition Plan: SNF when stable.   IV Access:    Peripheral IV   Procedures and diagnostic studies:   Ct Abdomen Pelvis W Contrast 04/16/2014 Hepatic and renal cysts. Multiple inguinal, iliac and retroperitoneal lymph nodes most consistent with lymphoma. These have increased in the interval from the prior exam. Further evaluation is recommended as clinically indicated. Changes in the right lower lobe which are likely related to sequela from prior inflammatory change.  Dg Chest Port 1 View 04/17/2014: Tiny left pleural effusion with left basilar atelectasis or infiltrate. No pulmonary edema. Degenerative changes right shoulder.    Ct Angio Chest Pe W/cm &/or Wo Cm 04/19/2014: No evidence of pulmonary embolism.  Small right pleural effusion and tiny left pleural effusion with associated bibasilar atelectasis. Subtle patchy peripheral opacification as well as a few tiny more discrete nodular densities over the lateral right lung. Largest nodule is unchanged over the posterior right upper lobe measuring 4 mm. This may represent infectious/inflammatory manifestation although cannot completely exclude subtle progression of metastatic disease.  Stable mediastinal adenopathy as described with slight progression of bilateral axillary adenopathy and upper abdominal adenopathy compatible with patient's known CLL.  Subtle mottled hazy sclerotic appearance of the sternal manubrium as cannot exclude metastatic disease. Consider bone scan.  Multiple hepatic cysts unchanged.  Stable cardiomegaly.      Medical Consultants:    None.  Anti-Infectives:    Cipro 04/16/2014 --> 04/18/14  Levaquin 04/18/14--->  Flagyl 04/16/2014 -->  Subjective:   Audrey Spencer is still dypneic.  No complaints of rectal pain.  No cough.  Continues to be a bit lethargic, but more awake than  yesterday.  Objective:    Filed Vitals:   04/19/14 0508 04/19/14 1238 04/19/14 2103 04/20/14 0450  BP: 109/50 142/51 130/48 137/53  Pulse: 85 79 75 80  Temp: 98.1 F (36.7 C) 97.5 F (36.4 C) 97.9 F (36.6 C) 98 F (36.7 C)  TempSrc: Oral Oral Oral Oral  Resp: 16 16 16 16   Height:      Weight:      SpO2: 99% 96% 100% 98%    Intake/Output Summary (Last 24 hours) at 04/20/14 0732 Last data filed at 04/20/14 0450  Gross per 24 hour  Intake    810 ml  Output   2550 ml  Net  -1740 ml    Exam: Gen:  Awake and alert Cardiovascular:  RRR, No M/R/G Respiratory:  Lungs with upper airway high pitched wheeze Gastrointestinal:  Abdomen soft, NT/ND, + BS Extremities:  No C/E/C   Data Reviewed:    Labs: Basic Metabolic Panel:  Recent Labs Lab 04/16/14 1624 04/17/14 0410 04/18/14 0447 04/19/14 0507 04/20/14 0500  NA 138 138 141 140 141  K 4.3 4.1 4.3 3.9 3.8  CL 98 102 102 101 98  CO2 28 31 32 32 37*  GLUCOSE 115* 133* 144* 119* 106*  BUN 14 15 15 15 15   CREATININE 0.50 0.68 0.63 0.62 0.76  CALCIUM 9.3 8.6 8.6 8.6 8.6   GFR Estimated Creatinine Clearance: 44.1 mL/min (by C-G formula based on Cr of 0.76). Liver Function Tests:  Recent Labs Lab 04/16/14 1624 04/17/14 0410  AST 29 24  ALT 20 22  ALKPHOS 76 59  BILITOT 0.4 0.2*  PROT 6.7 5.1*  ALBUMIN 4.0 2.9*    Recent Labs Lab 04/16/14 1624  LIPASE 20   CBC:  Recent Labs Lab 04/16/14 1624 04/17/14 04/17/14 0410 04/18/14 0447 04/19/14 0507 04/20/14 0500  WBC 21.7* 19.3* 13.7* 12.7* 15.2* 17.6*  NEUTROABS 4.6  --  3.0  --   --   --   HGB 9.9* 8.6* 7.6* 8.0* 8.0* 8.3*  HCT 31.1* 28.4* 25.0* 26.7* 26.3* 27.6*  MCV 95.4 97.3 97.7 98.9 98.9 100.4*  PLT 113* 105* 92* 92* 93* 86*   Sepsis Labs:  Recent Labs Lab 04/16/14 2209  04/17/14 0410 04/18/14 0447 04/19/14 0507 04/20/14 0500  WBC  --   < > 13.7* 12.7* 15.2* 17.6*  LATICACIDVEN 0.88  --   --   --   --   --   < > = values in this  interval not displayed. Microbiology Recent Results (from the past 240 hour(s))  Culture, Urine     Status: None   Collection Time: 04/16/14  5:08 PM  Result Value Ref Range Status   Specimen Description URINE, CLEAN CATCH  Final   Special Requests NONE  Final   Culture  Setup Time   Final    04/17/2014 13:27 Performed at [facility]   Final    >=100,000 COLONIES/ML Performed at [facility]    Culture   Final    ENTEROBACTER AEROGENES Performed at [facility]    Report Status 04/19/2014 FINAL  Final   Organism ID, Bacteria ENTEROBACTER AEROGENES  Final      Susceptibility   Enterobacter aerogenes - MIC*    CEFAZOLIN >=64 RESISTANT Resistant     CEFTRIAXONE <=1 SENSITIVE Sensitive  CIPROFLOXACIN <=0.25 SENSITIVE Sensitive     GENTAMICIN <=1 SENSITIVE Sensitive     LEVOFLOXACIN <=0.12 SENSITIVE Sensitive     NITROFURANTOIN 64 INTERMEDIATE Intermediate     TOBRAMYCIN <=1 SENSITIVE Sensitive     TRIMETH/SULFA <=20 SENSITIVE Sensitive     PIP/TAZO 8 SENSITIVE Sensitive     * ENTEROBACTER AEROGENES     Medications:   . antiseptic oral rinse  7 mL Mouth Rinse BID  . brimonidine  1 drop Both Eyes BID   And  . timolol  1 drop Both Eyes BID  . brinzolamide  1 drop Left Eye TID  . citalopram  10 mg Oral Daily  . donepezil  5 mg Oral QHS  . feeding supplement (ENSURE COMPLETE)  237 mL Oral BID BM  . hydrocortisone   Rectal TID  . Influenza vac split quadrivalent PF  0.5 mL Intramuscular Tomorrow-1000  . latanoprost  1 drop Left Eye QHS  . levofloxacin (LEVAQUIN) IV  750 mg Intravenous Q48H  . memantine  5 mg Oral BID  . metronidazole  500 mg Intravenous Q8H  . multivitamin with minerals  1 tablet Oral Daily  . pantoprazole  40 mg Oral Daily  . polyethylene glycol  17 g Oral Daily   Continuous Infusions:   Time spent: 25 minutes.   LOS: 4 days   [facility] Pager [phone]. If unable to reach me by  pager, please call my cell phone at [phone].  *Please refer to [email or web address], password [email or web address] to get updated schedule on who will round on this patient, as hospitalists switch teams weekly. If 7PM-7AM, please contact night-coverage at [email or web address], password [email or web address] for any overnight needs.  04/20/2014, 7:32 AM    Information printed out and reviewed with the patient/family:     In an effort to keep you and your family informed about your hospital stay, I am providing you with this information sheet. If you or your family have any questions, please do not hesitate to have the nursing staff page me to set up a meeting time.  Also note that the hospitalist doctors typically change on Tuesdays or Wednesdays to a different hospitalist doctor.  Audrey Spencer 04/20/2014 4 (Number of days in the hospital)  Treatment team:  Dr. Jacquelynn Cree, Hospitalist (Internist)   Pertinent labs / studies:  CT scan of the abdomen done 04/16/14 showed abnormal enlarged lymph nodes in the groin and pelvis.  Chest x-ray done 04/17/14 showed subtle changes on the left side, possibly due to pneumonia.  CT scan of the chest done 04/19/14 was negative for blood clots in the lungs.  Urine culture is growing a bacteria called Enterobacteriaceae, which is sensitive to the antibiotic your receiving.  Principle Diagnosis:  History of CLL, abnormally enlarged lymph nodes which may be causing rectal pain, rule out infection, urinary tract infection.   Plan for today:  Continue antibiotics: Levaquin and Flagyl.  Given ongoing shortness of breath noticed by nursing staff, will get a CT of the chest today to rule out blood clots in the lungs.  Follow-up with Dr. Alvy Bimler as an outpatient.  Anticipated discharge date: 04/21/14 if feeling better.

## 2014-04-21 ENCOUNTER — Inpatient Hospital Stay (HOSPITAL_COMMUNITY): Payer: Medicare Other

## 2014-04-21 DIAGNOSIS — R301 Vesical tenesmus: Secondary | ICD-10-CM | POA: Diagnosis present

## 2014-04-21 DIAGNOSIS — Z515 Encounter for palliative care: Secondary | ICD-10-CM

## 2014-04-21 DIAGNOSIS — R0902 Hypoxemia: Secondary | ICD-10-CM

## 2014-04-21 DIAGNOSIS — R140 Abdominal distension (gaseous): Secondary | ICD-10-CM | POA: Diagnosis present

## 2014-04-21 DIAGNOSIS — Z66 Do not resuscitate: Secondary | ICD-10-CM

## 2014-04-21 DIAGNOSIS — J9601 Acute respiratory failure with hypoxia: Secondary | ICD-10-CM | POA: Diagnosis not present

## 2014-04-21 LAB — BASIC METABOLIC PANEL
Anion gap: 7 (ref 5–15)
BUN: 13 mg/dL (ref 6–23)
CALCIUM: 8.6 mg/dL (ref 8.4–10.5)
CO2: 37 meq/L — AB (ref 19–32)
Chloride: 100 mEq/L (ref 96–112)
Creatinine, Ser: 0.56 mg/dL (ref 0.50–1.10)
GFR calc Af Amer: >90 mL/min (ref >90)
GFR, EST NON AFRICAN AMERICAN: 81 mL/min — AB (ref >90)
Glucose, Bld: 138 mg/dL — ABNORMAL HIGH (ref 70–99)
Potassium: 3.9 mEq/L (ref 3.7–5.3)
Sodium: 144 mEq/L (ref 137–147)

## 2014-04-21 LAB — CBC
HCT: 29.6 % — ABNORMAL LOW (ref 36.0–46.0)
HEMOGLOBIN: 9 g/dL — AB (ref 12.0–15.0)
MCH: 30.1 pg (ref 26.0–34.0)
MCHC: 30.4 g/dL (ref 30.0–36.0)
MCV: 99 fL (ref 78.0–100.0)
Platelets: 98 10*3/uL — ABNORMAL LOW (ref 150–400)
RBC: 2.99 MIL/uL — AB (ref 3.87–5.11)
RDW: 16.5 % — ABNORMAL HIGH (ref 11.5–15.5)
WBC: 17.9 10*3/uL — ABNORMAL HIGH (ref 4.0–10.5)

## 2014-04-21 MED ORDER — IPRATROPIUM-ALBUTEROL 0.5-2.5 (3) MG/3ML IN SOLN: 3.0000 mL | RESPIRATORY_TRACT | Status: DC | PRN

## 2014-04-21 MED ORDER — BENZONATATE 100 MG PO CAPS
200.0000 mg | ORAL_CAPSULE | Freq: Two times a day (BID) | ORAL | Status: DC
  Administered 2014-04-21 – 2014-04-22 (×2): 200 mg via ORAL
  Filled 2014-04-21 (×4): qty 2

## 2014-04-21 MED ORDER — ACETAMINOPHEN 325 MG PO TABS
650.0000 mg | ORAL_TABLET | Freq: Three times a day (TID) | ORAL | Status: DC
  Administered 2014-04-21 – 2014-04-22 (×2): 650 mg via ORAL
  Filled 2014-04-21 (×4): qty 2

## 2014-04-21 MED ORDER — DEXTROMETHORPHAN POLISTIREX 30 MG/5ML PO LQCR
30.0000 mg | Freq: Two times a day (BID) | ORAL | Status: DC
  Administered 2014-04-21: 30 mg via ORAL
  Filled 2014-04-21 (×2): qty 5

## 2014-04-21 MED ORDER — FUROSEMIDE 10 MG/ML IJ SOLN
40.0000 mg | Freq: Once | INTRAMUSCULAR | Status: AC
  Administered 2014-04-21: 40 mg via INTRAVENOUS
  Filled 2014-04-21: qty 4

## 2014-04-21 MED ORDER — SIMETHICONE 40 MG/0.6ML PO SUSP
80.0000 mg | Freq: Three times a day (TID) | ORAL | Status: DC
Start: 2014-04-21 — End: 2014-04-22
  Administered 2014-04-21 – 2014-04-22 (×2): 80 mg via ORAL
  Filled 2014-04-21 (×4): qty 1.2

## 2014-04-21 MED ORDER — BISACODYL 10 MG RE SUPP: 10.0000 mg | Freq: Every day | RECTAL | Status: DC | PRN

## 2014-04-21 MED ORDER — BELLADONNA ALKALOIDS-OPIUM 16.2-60 MG RE SUPP: 1.0000 | Freq: Four times a day (QID) | RECTAL | Status: DC | PRN

## 2014-04-21 NOTE — Clinical Documentation Improvement (Signed)
Pt requiring O2 3L via [place] with "worsening dyspnea", "shortness of breath noticed by nursing staff", "ongoing shortness of breath", "hypoxia"  documented in progress notes; nursing documented O2 saturation 79% on room air 11/18 and 84% 11/23.  CT chest was ordered 11/22 to rule out blood clots in lungs.   Please identify any additional associated clinical conditions present, if any, and document in your progress note and discharge summary.   Possible Clinical Conditions: -Acute Respiratory Failure with hypoxia -Other Condition (please specify) -Unable to determine at present  Thank you, Mateo Flow, RN [phone] Clinical Documentation Specialist

## 2014-04-21 NOTE — Progress Notes (Signed)
ANTIBIOTIC CONSULT NOTE - Follow Up  Pharmacy Consult for levofloxacin Indication: intra-abdominal infection, UTI, respiratory coverage  Allergies  Allergen Reactions  . Pseudoephedrine Other (See Comments)    insomnia  . Codeine     REACTION: nausea (mild)  . Doxycycline     REACTION: Nausea  . Nsaids     REACTION: GI bleed/anemia  . Solifenacin Succinate     REACTION: blurred vision    Patient Measurements: Height: 5\' 2"  (157.5 cm) Weight: 151 lb 0.2 oz (68.5 kg) IBW/kg (Calculated) : 50.1  Assessment: 89 yoF admitted 11/18 with intra-abdominal infection.  Hx diverticulosis, recurrent UTIs, CLL, dementia. Pharmacy originally consulted to dose ciprofloxacin, MD dosing metronidazole. Ciprofloxacin is now being changed to levofloxacin for improved respiratory coverage.  Chest Xray on 11/19 showed small L pleural effusion with L basilar atelectasis or infiltrate  Antiinfectives 11/18 >> ciprofloxain >> 11/20 11/18 >> metronidazole >>   11/20 >> levofloxacin >>  Labs / vitals Tmax: remains afebrile WBCs: 17.9, steadily increasing (21.7 on admit) Renal: SCr stable (baseline 0.6-0.7), CrCl 44 ml/min CG  Microbiology 11/18 urine:  Enterobacter - resistant to Ancef only, intermediate to Nitro, sensitive to everything else  Goal of Therapy:  levofloxacin per indication and renal function  Plan:  - Continue levofloxacin 750mg  IV q48h - continue metronidazole per MD - follow-up clinical course, culture results, renal function - follow-up antibiotic de-escalation and length of therapy    Adrian Saran, PharmD, BCPS Pager [phone] 04/21/2014 8:40 AM

## 2014-04-21 NOTE — Progress Notes (Addendum)
Progress Note   Audrey Spencer HYW:[id number] DOB: 1922/12/11 DOA: 04/16/2014 PCP: [name], MD   Brief Narrative:   Audrey Spencer is an 80 y.o. female with a PMH of CLL in remission, OSA noncompliant with C Pap, dementia and depression who was admitted 04/16/14 with worsening rectal pain. CT scan of the abdomen and pelvis done on admission showed multiple inguinal, iliac and retroperitoneal lymph nodes concerning for lymphoma. WBC was 21.7, hemoglobin 9.9, and platelets 113. Urinalysis showed findings consistent with a UTI.  Assessment/Plan:   Principal Problem:   Rectal pain with leukocytosis in a patient with a history of CLL and NHL  Patient was placed on empiric Cipro (changed to Levaquin)/Flagyl given UTI, rectal pain concerning for intra-abdominal infection. WBC improved.  Continue Anusol rectal cream  Follow-up with Dr. Alvy Bimler.  Palliative care consultation requested to address CODE status.  Active Problems:   Acute respiratory failure with hypoxia  RN reports patient drops her oxygen saturations to 88% of oxygen.  Continue supplemental oxygen.  Give 40 mg IV Lasix in case volume overloaded.  On empiric Levaquin (no obvious PNA).  Check CXR.    Moderate malnutrition  Pt meets criteria for mild/moderate MALNUTRITION in the context of chronic as evidenced by intake <75% for > 1 month and decreased muscle mass.  Ensure BID per RD recommendations.    Constipation  Start MiraLAX.  MOM and Dulcolax PR PRN.    Possible left lower lobe pneumonia  Chest x-ray findings show a left lower lobe infiltrate versus atelectasis (may be scarring from prior XRT).  Continue Levaquin.  CT of the chest done 04/19/14. No evidence of PE. Mediastinal adenopathy consistent with patient's history of CLL noted. Stable cardiomegaly.   GERD  Continue Protonix.    Depression  Continue Celexa.    Dementia  Continue Aricept and Namenda.    UTI (lower urinary  tract infection)  Cultures positive for Enterobacter, Levaquin sensitive.    Normocytic anemia  Likely secondary to anemia of chronic disease given her known history of CLL.  Fecal occult blood testing was positive.  Hemoglobin stable at 8 mg/dL.     DVT Prophylaxis  SCDs ordered.  Code Status: Full. Family Communication: No family at the bedside.  Donnel Saxon updated by telephone (granddaughter) [phone]. Disposition Plan: SNF when stable.   IV Access:    Peripheral IV   Procedures and diagnostic studies:   Ct Abdomen Pelvis W Contrast 04/16/2014 Hepatic and renal cysts. Multiple inguinal, iliac and retroperitoneal lymph nodes most consistent with lymphoma. These have increased in the interval from the prior exam. Further evaluation is recommended as clinically indicated. Changes in the right lower lobe which are likely related to sequela from prior inflammatory change.  Dg Chest Port 1 View 04/17/2014: Tiny left pleural effusion with left basilar atelectasis or infiltrate. No pulmonary edema. Degenerative changes right shoulder.    Ct Angio Chest Pe W/cm &/or Wo Cm 04/19/2014: No evidence of pulmonary embolism.  Small right pleural effusion and tiny left pleural effusion with associated bibasilar atelectasis. Subtle patchy peripheral opacification as well as a few tiny more discrete nodular densities over the lateral right lung. Largest nodule is unchanged over the posterior right upper lobe measuring 4 mm. This may represent infectious/inflammatory manifestation although cannot completely exclude subtle progression of metastatic disease.  Stable mediastinal adenopathy as described with slight progression of bilateral axillary adenopathy and upper abdominal adenopathy compatible with patient's known CLL.  Subtle mottled hazy  sclerotic appearance of the sternal manubrium as cannot exclude metastatic disease. Consider bone scan.  Multiple hepatic cysts unchanged.  Stable  cardiomegaly.      Medical Consultants:    None.  Anti-Infectives:    Cipro 04/16/2014 --> 04/18/14  Levaquin 04/18/14--->  Flagyl 04/16/2014 -->  Subjective:   Audrey Spencer is still dypneic.  States "I'm just tired."  Constipated.  Belching.  Dry cough.  RN reports hypoxia off oxygen.   Objective:    Filed Vitals:   04/20/14 0450 04/20/14 1554 04/20/14 2102 04/21/14 0501  BP: 137/53 123/47 125/47 154/53  Pulse: 80 82 80 90  Temp: 98 F (36.7 C) 97.7 F (36.5 C) 98.1 F (36.7 C) 98.3 F (36.8 C)  TempSrc: Oral Oral Oral Oral  Resp: 16 16 16 16   Height:      Weight:      SpO2: 98% 99% 100% 99%    Intake/Output Summary (Last 24 hours) at 04/21/14 0758 Last data filed at 04/21/14 0500  Gross per 24 hour  Intake   1923 ml  Output   1001 ml  Net    922 ml    Exam: Gen:  Awake and alert Cardiovascular:  RRR, No M/R/G Respiratory:  Lungs diminished in the bases Gastrointestinal:  Abdomen soft, NT/ND, + BS Extremities:  No C/E/C   Data Reviewed:    Labs: Basic Metabolic Panel:  Recent Labs Lab 04/17/14 0410 04/18/14 0447 04/19/14 0507 04/20/14 0500 04/21/14 0405  NA 138 141 140 141 144  K 4.1 4.3 3.9 3.8 3.9  CL 102 102 101 98 100  CO2 31 32 32 37* 37*  GLUCOSE 133* 144* 119* 106* 138*  BUN 15 15 15 15 13   CREATININE 0.68 0.63 0.62 0.76 0.56  CALCIUM 8.6 8.6 8.6 8.6 8.6   GFR Estimated Creatinine Clearance: 44.1 mL/min (by C-G formula based on Cr of 0.56). Liver Function Tests:  Recent Labs Lab 04/16/14 1624 04/17/14 0410  AST 29 24  ALT 20 22  ALKPHOS 76 59  BILITOT 0.4 0.2*  PROT 6.7 5.1*  ALBUMIN 4.0 2.9*    Recent Labs Lab 04/16/14 1624  LIPASE 20   CBC:  Recent Labs Lab 04/16/14 1624  04/17/14 0410 04/18/14 0447 04/19/14 0507 04/20/14 0500 04/21/14 0405  WBC 21.7*  < > 13.7* 12.7* 15.2* 17.6* 17.9*  NEUTROABS 4.6  --  3.0  --   --   --   --   HGB 9.9*  < > 7.6* 8.0* 8.0* 8.3* 9.0*  HCT 31.1*  < > 25.0*  26.7* 26.3* 27.6* 29.6*  MCV 95.4  < > 97.7 98.9 98.9 100.4* 99.0  PLT 113*  < > 92* 92* 93* 86* 98*  < > = values in this interval not displayed. Sepsis Labs:  Recent Labs Lab 04/16/14 2209  04/18/14 0447 04/19/14 0507 04/20/14 0500 04/21/14 0405  WBC  --   < > 12.7* 15.2* 17.6* 17.9*  LATICACIDVEN 0.88  --   --   --   --   --   < > = values in this interval not displayed. Microbiology Recent Results (from the past 240 hour(s))  Culture, Urine     Status: None   Collection Time: 04/16/14  5:08 PM  Result Value Ref Range Status   Specimen Description URINE, CLEAN CATCH  Final   Special Requests NONE  Final   Culture  Setup Time   Final    04/17/2014 13:27 Performed at [facility]  Colony Count   Final    >=100,000 COLONIES/ML Performed at [facility]    Culture   Final    ENTEROBACTER AEROGENES Performed at [facility]    Report Status 04/19/2014 FINAL  Final   Organism ID, Bacteria ENTEROBACTER AEROGENES  Final      Susceptibility   Enterobacter aerogenes - MIC*    CEFAZOLIN >=64 RESISTANT Resistant     CEFTRIAXONE <=1 SENSITIVE Sensitive     CIPROFLOXACIN <=0.25 SENSITIVE Sensitive     GENTAMICIN <=1 SENSITIVE Sensitive     LEVOFLOXACIN <=0.12 SENSITIVE Sensitive     NITROFURANTOIN 64 INTERMEDIATE Intermediate     TOBRAMYCIN <=1 SENSITIVE Sensitive     TRIMETH/SULFA <=20 SENSITIVE Sensitive     PIP/TAZO 8 SENSITIVE Sensitive     * ENTEROBACTER AEROGENES     Medications:   . antiseptic oral rinse  7 mL Mouth Rinse BID  . brimonidine  1 drop Both Eyes BID   And  . timolol  1 drop Both Eyes BID  . brinzolamide  1 drop Left Eye TID  . citalopram  10 mg Oral Daily  . donepezil  5 mg Oral QHS  . feeding supplement (ENSURE COMPLETE)  237 mL Oral BID BM  . hydrocortisone   Rectal TID  . latanoprost  1 drop Left Eye QHS  . levofloxacin (LEVAQUIN) IV  750 mg Intravenous Q48H  . memantine  5 mg Oral BID  . metronidazole  500 mg  Intravenous Q8H  . multivitamin with minerals  1 tablet Oral Daily  . pantoprazole  40 mg Oral Daily  . polyethylene glycol  17 g Oral Daily   Continuous Infusions:   Time spent: 25 minutes.   LOS: 5 days   [place] [facility] Pager [phone]. If unable to reach me by pager, please call my cell phone at [phone].  *Please refer to [email or web address], password [email or web address] to get updated schedule on who will round on this patient, as hospitalists switch teams weekly. If 7PM-7AM, please contact night-coverage at [email or web address], password [email or web address] for any overnight needs.  04/21/2014, 7:58 AM

## 2014-04-21 NOTE — Progress Notes (Signed)
Physical Therapy Treatment Patient Details Name: Audrey Spencer MRN: [id number] DOB: 1922/07/31 Today's Date: 04/21/2014    History of Present Illness 80 yo female admitted with rectal pain, weakness. Hx of CLL, anemia, osteoporosis, glaucoma, breast cancer. Pt lives alone with family nearby    PT Comments    Progressing slowly with mobility. Pt is deconditioned and becomes fatigued/sob with minimal activity. Family present-provided encouragement to pt so she would participate with therapy. Family questioning length of session/amount of therapy-explained that pt has a limited activity tolerance at this time and that she is just not going to be able to jump up and walk down the hallway. Pt actually performed a little better than therapist expected this session. Explained that pt will most likely need a good amount of rehab. Palliative care MD entered towards end of session for goals of care meeting. Pt would benefit from nursing assisting with OOB to chair daily but especially on days pt does not have therapy. Will continue to follow as able.  Follow Up Recommendations  SNF;Supervision/Assistance - 24 hour     Equipment Recommendations  None recommended by PT    Recommendations for Other Services OT consult     Precautions / Restrictions Precautions Precautions: Fall Restrictions Weight Bearing Restrictions: No    Mobility  Bed Mobility Overal bed mobility: Needs Assistance Bed Mobility: Supine to Sit     Supine to sit: HOB elevated;Min assist     General bed mobility comments: Assist for trunk to upright. Increased time.   Transfers Overall transfer level: Needs assistance Equipment used: Rolling walker (2 wheeled) Transfers: Sit to/from [facility] Sit to Stand: Mod assist Stand pivot transfers: Min assist       General transfer comment: Assist to rise, stabilize, control descent. VCS safety, technique, hand placement, pursed lip breathing.  Remained on 3L O2.   Ambulation/Gait Ambulation/Gait assistance: Min assist Ambulation Distance (Feet): 7 Feet Assistive device: Rolling walker (2 wheeled) Gait Pattern/deviations: Step-through pattern;Decreased stride length;Decreased step length - right;Decreased step length - left;Trunk flexed     General Gait Details: assist to steady pt. Vcs safety. Pt fatigues quickly/easily. Dyspnea 2-3/4. Remained on [place] O2. Noted bil LE buckling on last couple of steps.    Stairs            Wheelchair Mobility    Modified Rankin (Stroke Patients Only)       Balance Overall balance assessment: Needs assistance         Standing balance support: Bilateral upper extremity supported;During functional activity Standing balance-Leahy Scale: Poor                      Cognition Arousal/Alertness: Awake/alert Behavior During Therapy: WFL for tasks assessed/performed Overall Cognitive Status: Within Functional Limits for tasks assessed                      Exercises      General Comments        Pertinent Vitals/Pain Pain Assessment: No/denies pain    Home Living                      Prior Function            PT Goals (current goals can now be found in the care plan section) Progress towards PT goals: Progressing toward goals (slowly)    Frequency  Min 3X/week    PT Plan Current plan remains appropriate  Co-evaluation             End of Session Equipment Utilized During Treatment: Oxygen;Gait belt Activity Tolerance: Patient limited by fatigue Patient left: in chair;with chair alarm set;with family/visitor present (session interrupted by palliative medicine. called to front desk to have someone plug up chair alarm pad to box)     Time: 0919-8022 PT Time Calculation (min) (ACUTE ONLY): 37 min  Charges:  $Gait Training: 8-22 mins $Therapeutic Activity: 8-22 mins                    G Codes:      Weston Anna, MPT Pager:  [phone]

## 2014-04-21 NOTE — Consult Note (Signed)
Palliative Medicine Team at [facility]  Date: 04/21/2014   Patient Name: Audrey Spencer  DOB: 08-Aug-1922  MRN: [id number]  Age / Sex: 80 y.o., female   PCP: Biagio Borg, MD Referring Physician: Venetia Maxon Rama, MD  Active Problems: Principal Problem:   Rectal pain Active Problems:   Dementia   CLL (chronic lymphocytic leukemia)   Leukocytosis   UTI (lower urinary tract infection)   Normocytic anemia   GERD (gastroesophageal reflux disease)   Malnutrition of moderate degree   Constipation   Hypoxia   DNR (do not resuscitate)   HPI/Reason for Consultation: Audrey Spencer is an 80 yo woman from [place] admitted with rectal pain and confusion. Found to have UTI, elevated WBC, and hypoxia. CT of her abdomen showed diffuse lymphadenopathy likely indicative of relapse lymphoma. PMT consulted for goals of care.   Participants in Discussion: HCPOA: yes   Advance Directive:    Code Status Orders        Start     Ordered   04/21/14 1753  Do not attempt resuscitation (DNR)   Continuous    Question Answer Comment  In the event of cardiac or respiratory ARREST Do not call a "code blue"   In the event of cardiac or respiratory ARREST Do not perform Intubation, CPR, defibrillation or ACLS   In the event of cardiac or respiratory ARREST Use medication by any route, position, wound care, and other measures to relive pain and suffering. May use oxygen, suction and manual treatment of airway obstruction as needed for comfort.      04/21/14 1753    Advance Directive Documentation        Most Recent Value   Type of Advance Directive  Healthcare Power of Attorney   Pre-existing out of facility DNR order (yellow form or pink MOST form)     "MOST" Form in Place?         @ADVDIR @  I have reviewed the medical record, interviewed the patient and family, and examined the patient. The following aspects are pertinent.  Past Medical History  Diagnosis Date  . Dementia  01/25/2011  . CLL 09/22/2009  . Personal history of malignant neoplasm of breast 09/22/2009  . ANEMIA-IRON DEFICIENCY 04/09/2007  . Bronchiectasis without acute exacerbation 01/07/2009  . GERD 02/26/2008  . HYPERLIPIDEMIA 04/09/2007  . OBSTRUCTIVE SLEEP APNEA 04/22/2008  . Pancytopenia 03/19/2009  . Recurrent UTI 01/25/2011  . DIVERTICULOSIS, COLON 04/15/2010  . GLAUCOMA 04/09/2007  . 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 OSTEOPOROSIS 03/19/2009  . RECTOCELE WITHOUT MENTION OF UTERINE PROLAPSE 04/15/2010  . Depression 05/03/2012  . Stage I breast cancer 06/29/2012    Left breast stage 1 dx 08/1996;  ER positive Rx lumpectomy/RT/Tamoxifen   History   Social History  . Marital Status: Widowed    Spouse Name: N/A    Number of Children: N/A  . Years of Education: N/A   Social History Main Topics  . Smoking status: Never Smoker   . Smokeless tobacco: Never Used  . Alcohol Use: No  . Drug Use: No  . Sexual Activity: None   Other Topics Concern  . None   Social History Narrative   Family History  Problem Relation Age of Onset  . Hypertension Father   . Cancer Sister   . Cancer Brother    Scheduled Meds: . antiseptic oral rinse  7 mL Mouth Rinse BID  . benzonatate  200 mg Oral BID  . brimonidine  1 drop  Both Eyes BID   And  . timolol  1 drop Both Eyes BID  . brinzolamide  1 drop Left Eye TID  . citalopram  10 mg Oral Daily  . donepezil  5 mg Oral QHS  . feeding supplement (ENSURE COMPLETE)  237 mL Oral BID BM  . hydrocortisone   Rectal TID  . latanoprost  1 drop Left Eye QHS  . levofloxacin (LEVAQUIN) IV  750 mg Intravenous Q48H  . memantine  5 mg Oral BID  . metronidazole  500 mg Intravenous Q8H  . multivitamin with minerals  1 tablet Oral Daily  . pantoprazole  40 mg Oral Daily  . polyethylene glycol  17 g Oral Daily  . simethicone  80 mg Oral TID   Continuous Infusions:  PRN Meds:.acetaminophen **OR** acetaminophen, bisacodyl, calcium carbonate,  ipratropium-albuterol, magnesium hydroxide, ondansetron **OR** ondansetron (ZOFRAN) IV Allergies  Allergen Reactions  . Pseudoephedrine Other (See Comments)    insomnia  . Codeine     REACTION: nausea (mild)  . Doxycycline     REACTION: Nausea  . Nsaids     REACTION: GI bleed/anemia  . Solifenacin Succinate     REACTION: blurred vision   CBC:    Component Value Date/Time   WBC 17.9* 04/21/2014 0405   WBC 13.2* 02/28/2014 1302   HGB 9.0* 04/21/2014 0405   HGB 11.6 02/28/2014 1302   HCT 29.6* 04/21/2014 0405   HCT 36.9 02/28/2014 1302   PLT 98* 04/21/2014 0405   PLT 116* 02/28/2014 1302   MCV 99.0 04/21/2014 0405   MCV 96.9 02/28/2014 1302   NEUTROABS 3.0 04/17/2014 0410   NEUTROABS 3.0 02/28/2014 1302   LYMPHSABS 10.3* 04/17/2014 0410   LYMPHSABS 9.2* 02/28/2014 1302   MONOABS 0.4 04/17/2014 0410   MONOABS 0.7 02/28/2014 1302   EOSABS 0.0 04/17/2014 0410   EOSABS 0.3 02/28/2014 1302   BASOSABS 0.0 04/17/2014 0410   BASOSABS 0.0 02/28/2014 1302   Comprehensive Metabolic Panel:    Component Value Date/Time   NA 144 04/21/2014 0405   NA 142 02/28/2014 1303   K 3.9 04/21/2014 0405   K 3.8 02/28/2014 1303   CL 100 04/21/2014 0405   CL 100 06/29/2012 1029   CO2 37* 04/21/2014 0405   CO2 33* 02/28/2014 1303   BUN 13 04/21/2014 0405   BUN 13.9 02/28/2014 1303   CREATININE 0.56 04/21/2014 0405   CREATININE 0.7 02/28/2014 1303   GLUCOSE 138* 04/21/2014 0405   GLUCOSE 64* 02/28/2014 1303   GLUCOSE 86 06/29/2012 1029   CALCIUM 8.6 04/21/2014 0405   CALCIUM 9.5 02/28/2014 1303   CALCIUM 8.9 03/19/2009 2037   AST 24 04/17/2014 0410   AST 22 02/28/2014 1303   ALT 22 04/17/2014 0410   ALT 14 02/28/2014 1303   ALKPHOS 59 04/17/2014 0410   ALKPHOS 72 02/28/2014 1303   BILITOT 0.2* 04/17/2014 0410   BILITOT 0.35 02/28/2014 1303   PROT 5.1* 04/17/2014 0410   PROT 6.5 02/28/2014 1303   ALBUMIN 2.9* 04/17/2014 0410   ALBUMIN 3.7 02/28/2014 1303    Vital Signs: BP  113/38 mmHg  Pulse 71  Temp(Src) 97.7 F (36.5 C) (Oral)  Resp 16  Ht 5' 2"  (1.575 m)  Wt 68.5 kg (151 lb 0.2 oz)  BMI 27.61 kg/m2  SpO2 100% Filed Weights   04/17/14 0020  Weight: 68.5 kg (151 lb 0.2 oz)   11/22 0701 - 11/23 0700 In: 2023 [P.O.:780; IV Piggyback:450] Out: 1001 [Urine:1000; Stool:1]  Physical Exam:  General Appearance: Alert, oriented, no acute distress HEENT: negative Lungs: decreased breath sounds CV: tachycardiaS1, S2 normal, no murmur, rub or gallop, regular rate and rhythm Abdomen:soft, non-tender; bowel sounds normal; no masses,  no organomegaly Extremities:mild pitting edema pretibial bilaterally Skin:Skin color, texture, turgor normal. No rashes or lesions Psych: lethargic  Neuro: impaired due to mild short term memory loss  Summary of Established Goals of Care and Medical Treatment Preferences I met with the patient, her grandaughter and her niece to discuss her goals of care. Her niece tells me that the have already established DNR and that she is her HCPOA and they also have an advanced directive- I suspect the confusion is coming from the niece who expressed hesitation because he fatehr apparently was intubated and then lived for 5 years afterward. Teh patient herself was VERY clear on her wishes to never have CPR performed or to go to excessive extremes to prolong her life given her current state of health. Audrey Spencer wants to improve if possible but also tells me she is very tired- her family pushes her significantly to move and to rehab because they think that makes everything better. Audrey Spencer is extremely debilitated- she could barely hold her weght getting back into bed. I also note that she has had episodes of hypoxia and severe dyspnea- unclear etiology-she had mediastinal adenopathy and lung nodules but no significant findings of CHF on the CTA and no PE. I reviewed Dr. Calton Dach outpatient note re: relapsed disease-family and patient unaware of this-I doubt  that Damyia could ever tolerate chemotherapy in her current condition. Her family is hopeful that she can recover and apparently has in the past- they also said if she doesn't do well or continues to decline that they feel confident that [facility] can care for her very well.  Recommendations:  1. Code Status: DNR 2. Scope of Treatment:   1. Treat reversible illness  2. Treat uncomfortable symptoms 3. Avoid invasive tests or procedures and well as hospitalization 4. They request being "proactive with UTI symptoms" to avoid hospitalization 3. Symptom Management:  1. Severe Belching-excessive- will start scheduled simethecone- aerophagia? Medication side effect?lactose? 2. Pain- she looks uncomfortable- complains of colicky pain in her lower abdomen and also her "vaginal area" and "rectum" -describes the pain as sharp and stabbing and fleeting-but also frequent and distressing. Recommend scheduling Tylenol TID and could also consider B&O suppositories intravaginally or even Diazapam suppositories given vaginally for pelvic floor dysfunction (minimal systemic sedation). She may have Interstitial Cystitis and bladder spasms from recurrent UTI. Also consider low dose antibiotic daily ie Nitrfurantoin for UTI prophylaxis. 3. Dyspnea- ordered PRN Duonebs 4. Cough- I stopped her scheduled dextromethorphan which can cause delirium and started her on Bzd Benzonatate/Tessalon. 4. Palliative Prophylaxis: Bowel regimen ordered 5. Disposition: SNF for rehab-if deteriorates I recommend hospice care and a more comfort oriented approach-family not ready to discuss this yet.   Time In: 4:30 Time Out: 5:30 Time Total: 60 minutes Greater than 50%  of this time was spent counseling and coordinating care related to the above assessment and plan.  Signed by: Roma Schanz, DO  04/21/2014, 6:13 PM  Please contact Palliative Medicine Team phone at [phone] for questions and concerns.

## 2014-04-21 NOTE — Plan of Care (Signed)
Problem: Phase I Progression Outcomes Goal: OOB as tolerated unless otherwise ordered Outcome: Completed/Met Date Met:  04/21/14  Problem: Phase II Progression Outcomes Goal: Progress activity as tolerated unless otherwise ordered Outcome: Completed/Met Date Met:  04/21/14 Goal: Discharge plan established Outcome: Completed/Met Date Met:  04/21/14     

## 2014-04-21 NOTE — Progress Notes (Signed)
CSW continuing to follow for disposition planning.   CSW followed up with [facility] and confirmed that facility is able to accept pt when pt medically ready for discharge.  Per MD, pt not yet medically ready for discharge today. MD also has consulted palliative medicine team to address Goals of Care to address Code Status.   CSW spoke with palliative medicine team MD, Dr. Hilma Favors who plans to meet with pt granddaughter this afternoon and inquired if CSW could as pt granddaughter to meet at 4 pm this afternoon.   CSW contacted pt granddaughter, Evelena Peat to discuss. CSW informed pt granddaughter that [facility] able to accept and pt granddaughter relieved. CSW discussed PMT GOC meeting and pt granddaughter confirmed that she could meet at 4 pm this afternoon with Dr. Hilma Favors.   CSW met with pt at bedside to notify pt that [facility] does have bed available when pt medically stable. Pt agreeable.   CSW to continue to follow to provide support and assist with pt disposition needs.   [name], MSW, [place] [facility] [phone]

## 2014-04-22 DIAGNOSIS — J9601 Acute respiratory failure with hypoxia: Secondary | ICD-10-CM

## 2014-04-22 DIAGNOSIS — R301 Vesical tenesmus: Secondary | ICD-10-CM

## 2014-04-22 LAB — CBC
HEMATOCRIT: 31.3 % — AB (ref 36.0–46.0)
Hemoglobin: 9.3 g/dL — ABNORMAL LOW (ref 12.0–15.0)
MCH: 29.7 pg (ref 26.0–34.0)
MCHC: 29.7 g/dL — AB (ref 30.0–36.0)
MCV: 100 fL (ref 78.0–100.0)
PLATELETS: 102 10*3/uL — AB (ref 150–400)
RBC: 3.13 MIL/uL — ABNORMAL LOW (ref 3.87–5.11)
RDW: 17.2 % — AB (ref 11.5–15.5)
WBC: 16.8 10*3/uL — ABNORMAL HIGH (ref 4.0–10.5)

## 2014-04-22 LAB — BASIC METABOLIC PANEL
Anion gap: 6 (ref 5–15)
BUN: 17 mg/dL (ref 6–23)
CALCIUM: 8.6 mg/dL (ref 8.4–10.5)
CHLORIDE: 97 meq/L (ref 96–112)
CO2: 39 mEq/L — ABNORMAL HIGH (ref 19–32)
CREATININE: 0.79 mg/dL (ref 0.50–1.10)
GFR calc non Af Amer: 72 mL/min — ABNORMAL LOW (ref >90)
GFR, EST AFRICAN AMERICAN: 84 mL/min — AB (ref >90)
Glucose, Bld: 115 mg/dL — ABNORMAL HIGH (ref 70–99)
Potassium: 3.5 mEq/L — ABNORMAL LOW (ref 3.7–5.3)
Sodium: 142 mEq/L (ref 137–147)

## 2014-04-22 MED ORDER — HYDROCORTISONE 2.5 % RE CREA: TOPICAL_CREAM | Freq: Three times a day (TID) | RECTAL | Status: DC

## 2014-04-22 MED ORDER — ACETAMINOPHEN 325 MG PO TABS: 650.0000 mg | ORAL_TABLET | Freq: Three times a day (TID) | ORAL | Status: AC

## 2014-04-22 MED ORDER — BELLADONNA ALKALOIDS-OPIUM 16.2-60 MG RE SUPP: 1.0000 | Freq: Four times a day (QID) | RECTAL | Status: AC | PRN

## 2014-04-22 MED ORDER — POLYETHYLENE GLYCOL 3350 17 G PO PACK: 17.0000 g | PACK | Freq: Every day | ORAL | Status: AC

## 2014-04-22 MED ORDER — BELLADONNA ALKALOIDS-OPIUM 16.2-60 MG RE SUPP: 1.0000 | Freq: Four times a day (QID) | RECTAL | Status: DC | PRN

## 2014-04-22 MED ORDER — LEVOFLOXACIN 750 MG PO TABS: 750.0000 mg | ORAL_TABLET | Freq: Every day | ORAL | Status: DC

## 2014-04-22 MED ORDER — ENSURE COMPLETE PO LIQD: 237.0000 mL | Freq: Two times a day (BID) | ORAL | Status: DC

## 2014-04-22 MED ORDER — POTASSIUM CHLORIDE CRYS ER 20 MEQ PO TBCR
40.0000 meq | EXTENDED_RELEASE_TABLET | Freq: Once | ORAL | Status: AC
  Administered 2014-04-22: 40 meq via ORAL
  Filled 2014-04-22: qty 2

## 2014-04-22 MED ORDER — SIMETHICONE 40 MG/0.6ML PO SUSP: 80.0000 mg | Freq: Three times a day (TID) | ORAL | Status: AC

## 2014-04-22 MED ORDER — IPRATROPIUM-ALBUTEROL 0.5-2.5 (3) MG/3ML IN SOLN: 3.0000 mL | RESPIRATORY_TRACT | Status: AC | PRN

## 2014-04-22 MED ORDER — BISACODYL 10 MG RE SUPP: 10.0000 mg | Freq: Every day | RECTAL | Status: AC | PRN

## 2014-04-22 MED ORDER — BENZONATATE 200 MG PO CAPS: 200.0000 mg | ORAL_CAPSULE | Freq: Two times a day (BID) | ORAL | Status: AC

## 2014-04-22 NOTE — Progress Notes (Signed)
Pt for discharge to [facility].   CSW facilitated pt discharge needs including contacting facility, faxing pt discharge information via [facility], discussing with pt at bedside, discussing with pt granddaughter, Evelena Peat via telephone, providing RN phone number to call report, and arranging ambulance transport via [facility] to [facility].   Pt coping appropriately with transition to [facility] today. Pt in good spirits. Pt granddaughter plans to meet pt at [facility] this afternoon.  No further social work needs identified at this time.  CSW signing off.   [name], MSW, [place] [facility] [phone]

## 2014-04-22 NOTE — Care Management Note (Signed)
CARE MANAGEMENT NOTE 04/22/2014  Patient:  Audrey Spencer, Audrey Spencer   Account Number:  [id number]  Date Initiated:  04/17/2014  Documentation initiated by:  Marney Doctor  Subjective/Objective Assessment:   80 yo admited with Rectal pain and UTI     Action/Plan:   From home alone   Anticipated DC Date:  04/22/2014   Anticipated DC Plan:  SKILLED NURSING FACILITY  In-house referral  Clinical Social Worker      DC Planning Services  CM consult      Choice offered to / List presented to:             Status of service:  In process, will continue to follow Medicare Important Message given?  YES (If response is "NO", the following Medicare IM given date fields will be blank) Date Medicare IM given:  04/22/2014 Medicare IM given by:  Marney Doctor Date Additional Medicare IM given:   Additional Medicare IM given by:    Discharge Disposition:    Per UR Regulation:  Reviewed for med. necessity/level of care/duration of stay  If discussed at [place] of Stay Meetings, dates discussed:   04/22/2014    Comments:  04/22/14 Marney Doctor RN,BSN,NCM Pt to dc to [facility] today.  No other DC needs.  04/17/14 Marney Doctor RN,BSN,NCM [phone] Chart reviewed.  PT is recommending SNF.  CM will continue to follow for DC needs.

## 2014-04-22 NOTE — Discharge Summary (Signed)
Physician Discharge Summary  Audrey Spencer:[id number] DOB: [birth year] DOA: 04/16/2014  PCP: [name], MD  Admit date: 04/16/2014 Discharge date: 04/22/2014   Recommendations for Outpatient Follow-Up:    The patient is being discharged to a SNF for further rehabilitation.  Continue supplemental oxygen at discharge PRN to maintain oxygen saturations > 92%. May wean as tolerated.  Patient has been seen by the palliative care team, and is now a DNR.   Discharge Diagnosis:   Principal Problem:    UTI causing toxic encephalopathy Active Problems:    Dementia    CLL (chronic lymphocytic leukemia)    Leukocytosis    UTI (lower urinary tract infection)    Normocytic anemia    GERD (gastroesophageal reflux disease)    Malnutrition of moderate degree    Constipation    Acute respiratory failure with hypoxia    DNR (do not resuscitate)    Flatulence/gas pain/belching    Painful bladder spasm    Rectal pain    Diuretic induced hypokalemia   Discharge Condition: Improved.  Diet recommendation: Regular.   History of Present Illness:   Audrey Spencer is an 80 y.o. female with a PMH of CLL in remission, OSA noncompliant with C Pap, dementia and depression who was admitted 04/16/14 with worsening rectal pain. CT scan of the abdomen and pelvis done on admission showed multiple inguinal, iliac and retroperitoneal lymph nodes concerning for lymphoma. WBC was 21.7, hemoglobin 9.9, and platelets 113. Urinalysis showed findings consistent with a UTI.  Hospital Course by Problem:   Principal Problem:  UTI causing toxic encephalopathy/AMS  Cultures positive for Enterobacter, Levaquin sensitive.  Mental status back to baseline at discharge.  Will need 2 more days of Levaquin.  Active Problems: Hypokalemia  Diuretic induced.  Supplemented with 40 mEq KCL prior to d/c.  Rectal pain with leukocytosis in a patient with a history of CLL and  NHL  Patient was placed on empiric Cipro (changed to Levaquin)/Flagyl given UTI, rectal pain concerning for intra-abdominal infection. WBC improved.  Continue Anusol rectal cream  Follow-up with Dr. Alvy Bimler.  Palliative care consultation done 04/21/14 to address CODE status and symptom control.   Acute respiratory failure with hypoxia  RN reports patient drops her oxygen saturations to 88% of oxygen.  Continue supplemental oxygen. Given 40 mg IV Lasix x 2 to address volume overload.  CXR negative for pneumonia, CHF.  CT negative for pneumonia.  Continue supplemental oxygen as needed.   Moderate malnutrition  Pt meets criteria for mild/moderate MALNUTRITION in the context of chronic as evidenced by intake <75% for > 1 month and decreased muscle mass.  Ensure BID per RD recommendations.   Constipation  Continue MiraLAX and Dulcolax PR PRN.  GERD  Continue Protonix.   Depression  Continue Celexa.   Dementia  Continue Aricept and Namenda.   UTI (lower urinary tract infection)  Cultures positive for Enterobacter, Levaquin sensitive.   Normocytic anemia  Likely secondary to anemia of chronic disease given her known history of CLL.  Fecal occult blood testing was positive.  Hemoglobin stable at 8 mg/dL.    Medical Consultants:    Lane Hacker, Palliative Care Team   Discharge Exam:   Filed Vitals:   04/22/14 0647  BP: 139/44  Pulse: 74  Temp: 98 F (36.7 C)  Resp: 16   Filed Vitals:   04/21/14 0905 04/21/14 1352 04/21/14 2132 04/22/14 0647  BP:  113/38 122/42 139/44  Pulse:  71 76 74  Temp:  97.7 F (36.5 C) 98.2 F (36.8 C) 98 F (36.7 C)  TempSrc:  Oral Oral Oral  Resp:  16 16 16   Height:      Weight:      SpO2: 95% 100% 100% 100%    Gen:  NAD Cardiovascular:  RRR, No M/R/G Respiratory: Lungs diminished Gastrointestinal: Abdomen soft, NT/ND with normal active bowel sounds. Extremities: No C/E/C   The results of  significant diagnostics from this hospitalization (including imaging, microbiology, ancillary and laboratory) are listed below for reference.     Procedures and Diagnostic Studies:      Labs:   Basic Metabolic Panel:  Recent Labs Lab 04/18/14 0447 04/19/14 0507 04/20/14 0500 04/21/14 0405 04/22/14 0500  NA 141 140 141 144 142  K 4.3 3.9 3.8 3.9 3.5*  CL 102 101 98 100 97  CO2 32 32 37* 37* 39*  GLUCOSE 144* 119* 106* 138* 115*  BUN 15 15 15 13 17   CREATININE 0.63 0.62 0.76 0.56 0.79  CALCIUM 8.6 8.6 8.6 8.6 8.6   GFR Estimated Creatinine Clearance: 44.1 mL/min (by C-G formula based on Cr of 0.79). Liver Function Tests:  Recent Labs Lab 04/16/14 1624 04/17/14 0410  AST 29 24  ALT 20 22  ALKPHOS 76 59  BILITOT 0.4 0.2*  PROT 6.7 5.1*  ALBUMIN 4.0 2.9*    Recent Labs Lab 04/16/14 1624  LIPASE 20   CBC:  Recent Labs Lab 04/16/14 1624  04/17/14 0410 04/18/14 0447 04/19/14 0507 04/20/14 0500 04/21/14 0405 04/22/14 0500  WBC 21.7*  < > 13.7* 12.7* 15.2* 17.6* 17.9* 16.8*  NEUTROABS 4.6  --  3.0  --   --   --   --   --   HGB 9.9*  < > 7.6* 8.0* 8.0* 8.3* 9.0* 9.3*  HCT 31.1*  < > 25.0* 26.7* 26.3* 27.6* 29.6* 31.3*  MCV 95.4  < > 97.7 98.9 98.9 100.4* 99.0 100.0  PLT 113*  < > 92* 92* 93* 86* 98* 102*  < > = values in this interval not displayed.  Microbiology Recent Results (from the past 240 hour(s))  Culture, Urine     Status: None   Collection Time: 04/16/14  5:08 PM  Result Value Ref Range Status   Specimen Description URINE, CLEAN CATCH  Final   Special Requests NONE  Final   Culture  Setup Time   Final    04/17/2014 13:27 Performed at [place]   Final    >=100,000 COLONIES/ML Performed at [facility]    Culture   Final    ENTEROBACTER AEROGENES Performed at [facility]    Report Status 04/19/2014 FINAL  Final   Organism ID, Bacteria ENTEROBACTER AEROGENES  Final      Susceptibility    Enterobacter aerogenes - MIC*    CEFAZOLIN >=64 RESISTANT Resistant     CEFTRIAXONE <=1 SENSITIVE Sensitive     CIPROFLOXACIN <=0.25 SENSITIVE Sensitive     GENTAMICIN <=1 SENSITIVE Sensitive     LEVOFLOXACIN <=0.12 SENSITIVE Sensitive     NITROFURANTOIN 64 INTERMEDIATE Intermediate     TOBRAMYCIN <=1 SENSITIVE Sensitive     TRIMETH/SULFA <=20 SENSITIVE Sensitive     PIP/TAZO 8 SENSITIVE Sensitive     * ENTEROBACTER AEROGENES     Discharge Instructions:   Discharge Instructions    Call MD for:  extreme fatigue    Complete by:  As directed  Call MD for:  persistant nausea and vomiting    Complete by:  As directed      Call MD for:  severe uncontrolled pain    Complete by:  As directed      Call MD for:  temperature >100.4    Complete by:  As directed      Diet general    Complete by:  As directed      Increase activity slowly    Complete by:  As directed      Walk with assistance    Complete by:  As directed             Medication List    STOP taking these medications        cefUROXime 250 MG tablet  Commonly known as:  CEFTIN      TAKE these medications        acetaminophen 325 MG tablet  Commonly known as:  TYLENOL  Take 2 tablets (650 mg total) by mouth 3 (three) times daily.     aspirin 325 MG tablet  Take 325 mg by mouth daily.     AZOPT 1 % ophthalmic suspension  Generic drug:  brinzolamide  Place 1 drop into the left eye Twice daily.     benzonatate 200 MG capsule  Commonly known as:  TESSALON  Take 1 capsule (200 mg total) by mouth 2 (two) times daily.     bisacodyl 10 MG suppository  Commonly known as:  DULCOLAX  Place 1 suppository (10 mg total) rectally daily as needed for moderate constipation.     CALCIUM 600+D 600-400 MG-UNIT per tablet  Generic drug:  Calcium Carbonate-Vitamin D  Take 1 tablet by mouth daily.     citalopram 10 MG tablet  Commonly known as:  CELEXA  Take 10 mg by mouth daily.     COMBIGAN 0.2-0.5 % ophthalmic  solution  Generic drug:  brimonidine-timolol  Place 1 drop into both eyes 2 (two) times daily.     donepezil 5 MG tablet  Commonly known as:  ARICEPT  Take 1 tablet (5 mg total) by mouth at bedtime.     feeding supplement (ENSURE COMPLETE) Liqd  Take 237 mLs by mouth 2 (two) times daily between meals.     hydrocortisone 2.5 % rectal cream  Commonly known as:  ANUSOL-HC  Place rectally 3 (three) times daily.     ipratropium-albuterol 0.5-2.5 (3) MG/3ML Soln  Commonly known as:  DUONEB  Take 3 mLs by nebulization every 4 (four) hours as needed.     latanoprost 0.005 % ophthalmic solution  Commonly known as:  XALATAN  Place into the left eye at bedtime.     levofloxacin 750 MG tablet  Commonly known as:  LEVAQUIN  Take 1 tablet (750 mg total) by mouth daily.     memantine 5 MG tablet  Commonly known as:  NAMENDA  Take 1 tablet (5 mg total) by mouth 2 (two) times daily.     multivitamin with minerals Tabs tablet  Take 1 tablet by mouth daily.     omeprazole 20 MG capsule  Commonly known as:  PRILOSEC  TAKE ONE CAPSULE BY MOUTH EVERY DAY     opium-belladonna 16.2-60 MG suppository  Commonly known as:  B&O SUPPRETTES  Place 1 suppository rectally every 6 (six) hours as needed for bladder spasms.     polyethylene glycol packet  Commonly known as:  MIRALAX / GLYCOLAX  Take 17 g by mouth  daily.     simethicone 40 MG/0.6ML drops  Commonly known as:  MYLICON  Take 1.2 mLs (80 mg total) by mouth 3 (three) times daily.          Time coordinating discharge: 35 minutes.  Signed:  RAMA,CHRISTINA  Pager [phone] [facility] 04/22/2014, 10:04 AM

## 2014-05-14 ENCOUNTER — Telehealth: Payer: Self-pay | Admitting: Hematology and Oncology

## 2014-05-14 NOTE — Telephone Encounter (Signed)
lvm for pt regarding to time change on 12.22 appt...Marland KitchenMarland Kitchen

## 2014-05-20 ENCOUNTER — Other Ambulatory Visit: Payer: Medicare Other

## 2014-05-20 ENCOUNTER — Ambulatory Visit: Payer: Medicare Other | Admitting: Hematology and Oncology

## 2014-05-24 ENCOUNTER — Inpatient Hospital Stay (HOSPITAL_COMMUNITY)
Admission: EM | Admit: 2014-05-24 | Discharge: 2014-05-27 | DRG: 377 | Disposition: A | Discharge status: Skilled Nursing Facility | Payer: Medicare Other | Attending: Internal Medicine | Admitting: Internal Medicine

## 2014-05-24 ENCOUNTER — Emergency Department (HOSPITAL_COMMUNITY): Payer: Medicare Other

## 2014-05-24 DIAGNOSIS — A419 Sepsis, unspecified organism: Secondary | ICD-10-CM

## 2014-05-24 DIAGNOSIS — R0602 Shortness of breath: Secondary | ICD-10-CM

## 2014-05-24 DIAGNOSIS — G4733 Obstructive sleep apnea (adult) (pediatric): Secondary | ICD-10-CM | POA: Diagnosis present

## 2014-05-24 DIAGNOSIS — C9111 Chronic lymphocytic leukemia of B-cell type in remission: Secondary | ICD-10-CM | POA: Diagnosis present

## 2014-05-24 DIAGNOSIS — R791 Abnormal coagulation profile: Secondary | ICD-10-CM | POA: Diagnosis present

## 2014-05-24 DIAGNOSIS — Z87440 Personal history of urinary (tract) infections: Secondary | ICD-10-CM | POA: Diagnosis not present

## 2014-05-24 DIAGNOSIS — K922 Gastrointestinal hemorrhage, unspecified: Secondary | ICD-10-CM

## 2014-05-24 DIAGNOSIS — Z86718 Personal history of other venous thrombosis and embolism: Secondary | ICD-10-CM | POA: Diagnosis not present

## 2014-05-24 DIAGNOSIS — J9601 Acute respiratory failure with hypoxia: Secondary | ICD-10-CM | POA: Diagnosis present

## 2014-05-24 DIAGNOSIS — K625 Hemorrhage of anus and rectum: Secondary | ICD-10-CM | POA: Diagnosis present

## 2014-05-24 DIAGNOSIS — J9811 Atelectasis: Secondary | ICD-10-CM | POA: Diagnosis present

## 2014-05-24 DIAGNOSIS — H409 Unspecified glaucoma: Secondary | ICD-10-CM | POA: Diagnosis present

## 2014-05-24 DIAGNOSIS — M810 Age-related osteoporosis without current pathological fracture: Secondary | ICD-10-CM | POA: Diagnosis present

## 2014-05-24 DIAGNOSIS — Z7982 Long term (current) use of aspirin: Secondary | ICD-10-CM | POA: Diagnosis not present

## 2014-05-24 DIAGNOSIS — D62 Acute posthemorrhagic anemia: Secondary | ICD-10-CM

## 2014-05-24 DIAGNOSIS — Z9119 Patient's noncompliance with other medical treatment and regimen: Secondary | ICD-10-CM | POA: Diagnosis present

## 2014-05-24 DIAGNOSIS — D696 Thrombocytopenia, unspecified: Secondary | ICD-10-CM | POA: Diagnosis present

## 2014-05-24 DIAGNOSIS — R578 Other shock: Secondary | ICD-10-CM | POA: Diagnosis present

## 2014-05-24 DIAGNOSIS — E440 Moderate protein-calorie malnutrition: Secondary | ICD-10-CM | POA: Diagnosis present

## 2014-05-24 DIAGNOSIS — Z809 Family history of malignant neoplasm, unspecified: Secondary | ICD-10-CM | POA: Diagnosis not present

## 2014-05-24 DIAGNOSIS — I517 Cardiomegaly: Secondary | ICD-10-CM | POA: Diagnosis present

## 2014-05-24 DIAGNOSIS — Z7901 Long term (current) use of anticoagulants: Secondary | ICD-10-CM

## 2014-05-24 DIAGNOSIS — F0390 Unspecified dementia without behavioral disturbance: Secondary | ICD-10-CM | POA: Diagnosis present

## 2014-05-24 DIAGNOSIS — D688 Other specified coagulation defects: Secondary | ICD-10-CM

## 2014-05-24 DIAGNOSIS — Z79899 Other long term (current) drug therapy: Secondary | ICD-10-CM | POA: Diagnosis not present

## 2014-05-24 DIAGNOSIS — K5731 Diverticulosis of large intestine without perforation or abscess with bleeding: Principal | ICD-10-CM | POA: Diagnosis present

## 2014-05-24 DIAGNOSIS — J90 Pleural effusion, not elsewhere classified: Secondary | ICD-10-CM | POA: Diagnosis present

## 2014-05-24 DIAGNOSIS — N390 Urinary tract infection, site not specified: Secondary | ICD-10-CM | POA: Diagnosis present

## 2014-05-24 DIAGNOSIS — B964 Proteus (mirabilis) (morganii) as the cause of diseases classified elsewhere: Secondary | ICD-10-CM | POA: Diagnosis present

## 2014-05-24 DIAGNOSIS — C50919 Malignant neoplasm of unspecified site of unspecified female breast: Secondary | ICD-10-CM | POA: Diagnosis present

## 2014-05-24 DIAGNOSIS — D500 Iron deficiency anemia secondary to blood loss (chronic): Secondary | ICD-10-CM | POA: Diagnosis present

## 2014-05-24 DIAGNOSIS — C9110 Chronic lymphocytic leukemia of B-cell type not having achieved remission: Secondary | ICD-10-CM | POA: Diagnosis present

## 2014-05-24 DIAGNOSIS — T45515A Adverse effect of anticoagulants, initial encounter: Secondary | ICD-10-CM

## 2014-05-24 DIAGNOSIS — E785 Hyperlipidemia, unspecified: Secondary | ICD-10-CM | POA: Diagnosis present

## 2014-05-24 DIAGNOSIS — K219 Gastro-esophageal reflux disease without esophagitis: Secondary | ICD-10-CM | POA: Diagnosis present

## 2014-05-24 DIAGNOSIS — R0902 Hypoxemia: Secondary | ICD-10-CM

## 2014-05-24 DIAGNOSIS — Z66 Do not resuscitate: Secondary | ICD-10-CM | POA: Diagnosis present

## 2014-05-24 DIAGNOSIS — D72829 Elevated white blood cell count, unspecified: Secondary | ICD-10-CM | POA: Diagnosis present

## 2014-05-24 DIAGNOSIS — D638 Anemia in other chronic diseases classified elsewhere: Secondary | ICD-10-CM | POA: Diagnosis present

## 2014-05-24 DIAGNOSIS — D6959 Other secondary thrombocytopenia: Secondary | ICD-10-CM | POA: Diagnosis present

## 2014-05-24 DIAGNOSIS — Z853 Personal history of malignant neoplasm of breast: Secondary | ICD-10-CM | POA: Diagnosis not present

## 2014-05-24 DIAGNOSIS — IMO0001 Reserved for inherently not codable concepts without codable children: Secondary | ICD-10-CM

## 2014-05-24 DIAGNOSIS — R58 Hemorrhage, not elsewhere classified: Secondary | ICD-10-CM | POA: Diagnosis present

## 2014-05-24 LAB — CBC WITH DIFFERENTIAL/PLATELET
Basophils Absolute: 0 10*3/uL (ref 0.0–0.1)
Basophils Absolute: 0 10*3/uL (ref 0.0–0.1)
Basophils Relative: 0 % (ref 0–1)
Basophils Relative: 0 % (ref 0–1)
EOS ABS: 0 10*3/uL (ref 0.0–0.7)
Eosinophils Absolute: 0.3 10*3/uL (ref 0.0–0.7)
Eosinophils Relative: 1 % (ref 0–5)
Eosinophils Relative: 0 % (ref 0–5)
HCT: 18.8 % — ABNORMAL LOW (ref 36.0–46.0)
HEMATOCRIT: 25.6 % — AB (ref 36.0–46.0)
Hemoglobin: 5.5 g/dL — CL (ref 12.0–15.0)
Hemoglobin: 8 g/dL — ABNORMAL LOW (ref 12.0–15.0)
LYMPHS PCT: 83 % — AB (ref 12–46)
LYMPHS PCT: 84 % — AB (ref 12–46)
Lymphs Abs: 21.6 10*3/uL — ABNORMAL HIGH (ref 0.7–4.0)
Lymphs Abs: 20.9 10*3/uL — ABNORMAL HIGH (ref 0.7–4.0)
MCH: 29.1 pg (ref 26.0–34.0)
MCH: 28.8 pg (ref 26.0–34.0)
MCHC: 29.3 g/dL — ABNORMAL LOW (ref 30.0–36.0)
MCHC: 31.3 g/dL (ref 30.0–36.0)
MCV: 99.5 fL (ref 78.0–100.0)
MCV: 92.1 fL (ref 78.0–100.0)
MONO ABS: 0.8 10*3/uL (ref 0.1–1.0)
MONOS PCT: 3 % (ref 3–12)
MONOS PCT: 3 % (ref 3–12)
Monocytes Absolute: 0.8 10*3/uL (ref 0.1–1.0)
NEUTROS ABS: 3.3 10*3/uL (ref 1.7–7.7)
Neutro Abs: 3.4 10*3/uL (ref 1.7–7.7)
Neutrophils Relative %: 13 % — ABNORMAL LOW (ref 43–77)
Neutrophils Relative %: 13 % — ABNORMAL LOW (ref 43–77)
PLATELETS: 92 10*3/uL — AB (ref 150–400)
Platelets: 77 10*3/uL — ABNORMAL LOW (ref 150–400)
RBC: 1.89 MIL/uL — AB (ref 3.87–5.11)
RBC: 2.78 MIL/uL — AB (ref 3.87–5.11)
RDW: 17 % — ABNORMAL HIGH (ref 11.5–15.5)
RDW: 19 % — AB (ref 11.5–15.5)
WBC: 26.1 10*3/uL — AB (ref 4.0–10.5)
WBC: 25 10*3/uL — AB (ref 4.0–10.5)

## 2014-05-24 LAB — COMPREHENSIVE METABOLIC PANEL
ALBUMIN: 3.2 g/dL — AB (ref 3.5–5.2)
ALBUMIN: 3.3 g/dL — AB (ref 3.5–5.2)
ALK PHOS: 48 U/L (ref 39–117)
ALT: 27 U/L (ref 0–35)
ALT: 24 U/L (ref 0–35)
AST: 31 U/L (ref 0–37)
AST: 31 U/L (ref 0–37)
Alkaline Phosphatase: 48 U/L (ref 39–117)
Anion gap: 3 — ABNORMAL LOW (ref 5–15)
Anion gap: 5 (ref 5–15)
BILIRUBIN TOTAL: 0.5 mg/dL (ref 0.3–1.2)
BUN: 30 mg/dL — ABNORMAL HIGH (ref 6–23)
BUN: 22 mg/dL (ref 6–23)
CALCIUM: 7.8 mg/dL — AB (ref 8.4–10.5)
CO2: 39 mmol/L — ABNORMAL HIGH (ref 19–32)
CO2: 33 mmol/L — ABNORMAL HIGH (ref 19–32)
CREATININE: 0.46 mg/dL — AB (ref 0.50–1.10)
Calcium: 8.1 mg/dL — ABNORMAL LOW (ref 8.4–10.5)
Chloride: 97 mEq/L (ref 96–112)
Chloride: 102 mEq/L (ref 96–112)
Creatinine, Ser: 0.45 mg/dL — ABNORMAL LOW (ref 0.50–1.10)
GFR calc Af Amer: >90 mL/min (ref >90)
GFR calc non Af Amer: 86 mL/min — ABNORMAL LOW (ref >90)
GFR calc non Af Amer: 86 mL/min — ABNORMAL LOW (ref >90)
GLUCOSE: 129 mg/dL — AB (ref 70–99)
Glucose, Bld: 134 mg/dL — ABNORMAL HIGH (ref 70–99)
Potassium: 3.9 mmol/L (ref 3.5–5.1)
Potassium: 4.3 mmol/L (ref 3.5–5.1)
SODIUM: 139 mmol/L (ref 135–145)
SODIUM: 140 mmol/L (ref 135–145)
TOTAL PROTEIN: 5.3 g/dL — AB (ref 6.0–8.3)
Total Bilirubin: 0.6 mg/dL (ref 0.3–1.2)
Total Protein: 5.3 g/dL — ABNORMAL LOW (ref 6.0–8.3)

## 2014-05-24 LAB — URINE MICROSCOPIC-ADD ON

## 2014-05-24 LAB — I-STAT CHEM 8, ED
BUN: 27 mg/dL — ABNORMAL HIGH (ref 6–23)
Calcium, Ion: 1.09 mmol/L — ABNORMAL LOW (ref 1.13–1.30)
Chloride: 94 mEq/L — ABNORMAL LOW (ref 96–112)
Creatinine, Ser: 0.7 mg/dL (ref 0.50–1.10)
Glucose, Bld: 130 mg/dL — ABNORMAL HIGH (ref 70–99)
HCT: 20 % — ABNORMAL LOW (ref 36.0–46.0)
HEMOGLOBIN: 6.8 g/dL — AB (ref 12.0–15.0)
Potassium: 4 mmol/L (ref 3.5–5.1)
SODIUM: 141 mmol/L (ref 135–145)
TCO2: 32 mmol/L (ref 0–100)

## 2014-05-24 LAB — BLOOD GAS, ARTERIAL
ACID-BASE EXCESS: 9.8 mmol/L — AB (ref 0.0–2.0)
Bicarbonate: 35.1 mEq/L — ABNORMAL HIGH (ref 20.0–24.0)
DRAWN BY: 331471
O2 CONTENT: 3 L/min
O2 SAT: 93.1 %
PO2 ART: 82.2 mmHg (ref 80.0–100.0)
Patient temperature: 98.6
TCO2: 34.8 mmol/L (ref 0–100)
pCO2 arterial: 60 mmHg (ref 35.0–45.0)
pH, Arterial: 7.385 (ref 7.350–7.450)

## 2014-05-24 LAB — PREPARE RBC (CROSSMATCH)

## 2014-05-24 LAB — POC OCCULT BLOOD, ED: Fecal Occult Bld: POSITIVE — AB

## 2014-05-24 LAB — URINALYSIS, ROUTINE W REFLEX MICROSCOPIC
BILIRUBIN URINE: NEGATIVE
Glucose, UA: NEGATIVE mg/dL
Ketones, ur: NEGATIVE mg/dL
NITRITE: NEGATIVE
PROTEIN: NEGATIVE mg/dL
SPECIFIC GRAVITY, URINE: 1.019 (ref 1.005–1.030)
UROBILINOGEN UA: 0.2 mg/dL (ref 0.0–1.0)
pH: 7.5 (ref 5.0–8.0)

## 2014-05-24 LAB — CBG MONITORING, ED: Glucose-Capillary: 113 mg/dL — ABNORMAL HIGH (ref 70–99)

## 2014-05-24 LAB — I-STAT TROPONIN, ED: Troponin i, poc: 0.01 ng/mL (ref 0.00–0.08)

## 2014-05-24 LAB — TSH: TSH: 1.477 u[IU]/mL (ref 0.350–4.500)

## 2014-05-24 LAB — PROTIME-INR
INR: 2.25 — ABNORMAL HIGH (ref 0.00–1.49)
INR: 1.22 (ref 0.00–1.49)
Prothrombin Time: 25.1 seconds — ABNORMAL HIGH (ref 11.6–15.2)
Prothrombin Time: 15.5 seconds — ABNORMAL HIGH (ref 11.6–15.2)

## 2014-05-24 LAB — BRAIN NATRIURETIC PEPTIDE: B Natriuretic Peptide: 386.1 pg/mL — ABNORMAL HIGH (ref 0.0–100.0)

## 2014-05-24 LAB — PHOSPHORUS: PHOSPHORUS: 3.4 mg/dL (ref 2.3–4.6)

## 2014-05-24 LAB — APTT: aPTT: 31 seconds (ref 24–37)

## 2014-05-24 LAB — MAGNESIUM: Magnesium: 2.2 mg/dL (ref 1.5–2.5)

## 2014-05-24 LAB — LIPASE, BLOOD: LIPASE: 27 U/L (ref 11–59)

## 2014-05-24 LAB — I-STAT CG4 LACTIC ACID, ED: Lactic Acid, Venous: 1 mmol/L (ref 0.5–2.2)

## 2014-05-24 LAB — MRSA PCR SCREENING: MRSA BY PCR: NEGATIVE

## 2014-05-24 MED ORDER — PIPERACILLIN-TAZOBACTAM 3.375 G IVPB
3.3750 g | Freq: Three times a day (TID) | INTRAVENOUS | Status: DC
  Administered 2014-05-24 – 2014-05-27 (×8): 3.375 g via INTRAVENOUS
  Filled 2014-05-24 (×8): qty 50

## 2014-05-24 MED ORDER — DORZOLAMIDE HCL-TIMOLOL MAL 2-0.5 % OP SOLN
1.0000 [drp] | Freq: Two times a day (BID) | OPHTHALMIC | Status: DC
  Administered 2014-05-24 – 2014-05-27 (×7): 1 [drp] via OPHTHALMIC
  Filled 2014-05-24: qty 10

## 2014-05-24 MED ORDER — ACETAMINOPHEN 325 MG PO TABS: 650.0000 mg | ORAL_TABLET | Freq: Four times a day (QID) | ORAL | Status: DC | PRN

## 2014-05-24 MED ORDER — VITAMINS A & D EX OINT
TOPICAL_OINTMENT | CUTANEOUS | Status: AC
  Administered 2014-05-24: 1
  Filled 2014-05-24: qty 5

## 2014-05-24 MED ORDER — PANTOPRAZOLE SODIUM 40 MG IV SOLR
40.0000 mg | Freq: Two times a day (BID) | INTRAVENOUS | Status: DC
  Administered 2014-05-24 – 2014-05-27 (×6): 40 mg via INTRAVENOUS
  Filled 2014-05-24 (×7): qty 40

## 2014-05-24 MED ORDER — VITAMIN K1 10 MG/ML IJ SOLN
5.0000 mg | Freq: Once | INTRAMUSCULAR | Status: AC
  Administered 2014-05-24: 5 mg via INTRAVENOUS
  Filled 2014-05-24: qty 0.5

## 2014-05-24 MED ORDER — IOHEXOL 300 MG/ML  SOLN
100.0000 mL | Freq: Once | INTRAMUSCULAR | Status: AC | PRN
  Administered 2014-05-24: 100 mL via INTRAVENOUS

## 2014-05-24 MED ORDER — SODIUM CHLORIDE 0.9 % IV BOLUS (SEPSIS)
1000.0000 mL | Freq: Once | INTRAVENOUS | Status: AC
  Administered 2014-05-24: 1000 mL via INTRAVENOUS

## 2014-05-24 MED ORDER — ONDANSETRON HCL 4 MG/2ML IJ SOLN: 4.0000 mg | Freq: Four times a day (QID) | INTRAMUSCULAR | Status: DC | PRN

## 2014-05-24 MED ORDER — ACETAMINOPHEN 650 MG RE SUPP
650.0000 mg | Freq: Four times a day (QID) | RECTAL | Status: DC | PRN
Start: 2014-05-24 — End: 2014-05-27

## 2014-05-24 MED ORDER — IOHEXOL 300 MG/ML  SOLN
50.0000 mL | Freq: Once | INTRAMUSCULAR | Status: AC | PRN
  Administered 2014-05-24: 50 mL via ORAL

## 2014-05-24 MED ORDER — SODIUM CHLORIDE 0.9 % IV SOLN
10.0000 mL/h | Freq: Once | INTRAVENOUS | Status: AC
  Administered 2014-05-24: 10 mL/h via INTRAVENOUS

## 2014-05-24 MED ORDER — SODIUM CHLORIDE 0.9 % IV SOLN
INTRAVENOUS | Status: AC
  Administered 2014-05-24: 11:00:00 via INTRAVENOUS

## 2014-05-24 MED ORDER — ONDANSETRON HCL 4 MG PO TABS: 4.0000 mg | ORAL_TABLET | Freq: Four times a day (QID) | ORAL | Status: DC | PRN

## 2014-05-24 MED ORDER — SODIUM CHLORIDE 0.9 % IV SOLN
80.0000 mg | Freq: Once | INTRAVENOUS | Status: AC
  Administered 2014-05-24: 80 mg via INTRAVENOUS
  Filled 2014-05-24: qty 80

## 2014-05-24 MED ORDER — PIPERACILLIN-TAZOBACTAM 3.375 G IVPB 30 MIN
3.3750 g | Freq: Once | INTRAVENOUS | Status: AC
  Administered 2014-05-24: 3.375 g via INTRAVENOUS
  Filled 2014-05-24: qty 50

## 2014-05-24 MED ORDER — CETYLPYRIDINIUM CHLORIDE 0.05 % MT LIQD
7.0000 mL | Freq: Two times a day (BID) | OROMUCOSAL | Status: DC
  Administered 2014-05-24 – 2014-05-27 (×6): 7 mL via OROMUCOSAL

## 2014-05-24 NOTE — ED Notes (Signed)
Bed: QA83 Expected date:  Expected time:  Means of arrival:  Comments: EMS 48F [facility] Ble[facility]

## 2014-05-24 NOTE — ED Provider Notes (Signed)
CSN: [id number]     Arrival date & time 05/24/14  0604 History   First MD Initiated Contact with Patient 05/24/14 [phone]     Chief Complaint  Patient presents with  . GI Bleeding     (Consider location/radiation/quality/duration/timing/severity/associated sxs/prior Treatment) The history is provided by the patient. No language interpreter was used.  Audrey Spencer is an 80 y/o F with PMHx of dementia, CLL, breast cancer with lumpectomy, iron deficiency anemia, GERD, hyperlipidemia, glaucoma, currently on Coumadin for DVT presenting to the ED from [place] regarding GI bleeding that occurred this morning. As per patient, reported that there was bright red blood in the commode this morning-reported that it was all over the commode. Stated that she had a normal bowel movement yesterday. Denied abdominal pain, nausea, vomiting, dizziness, syncopal episode, blurred vision, sudden loss of vision, lightheadedness, chest pain, shortness of breath, difficulty breathing, hemoptysis, hematemesis, cough.  PCP Dr. Jenny Reichmann  Past Medical History  Diagnosis Date  . Dementia 01/25/2011  . CLL 09/22/2009  . Personal history of malignant neoplasm of breast 09/22/2009  . ANEMIA-IRON DEFICIENCY 04/09/2007  . Bronchiectasis without acute exacerbation 01/07/2009  . GERD 02/26/2008  . HYPERLIPIDEMIA 04/09/2007  . OBSTRUCTIVE SLEEP APNEA 04/22/2008  . Pancytopenia 03/19/2009  . Recurrent UTI 01/25/2011  . DIVERTICULOSIS, COLON 04/15/2010  . GLAUCOMA 04/09/2007  . 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 OSTEOPOROSIS 03/19/2009  . RECTOCELE WITHOUT MENTION OF UTERINE PROLAPSE 04/15/2010  . Depression 05/03/2012  . Stage I breast cancer 06/29/2012    Left breast stage 1 dx 08/1996;  ER positive Rx lumpectomy/RT/Tamoxifen   Past Surgical History  Procedure Laterality Date  . Breast surgery      lumpectomy  . Abdominal hysterectomy    . Rotater cuff     Family History  Problem Relation Age of Onset  .  Hypertension Father   . Cancer Sister   . Cancer Brother    History  Substance Use Topics  . Smoking status: Never Smoker   . Smokeless tobacco: Never Used  . Alcohol Use: No   OB History    No data available     Review of Systems  Constitutional: Negative for fever and chills.  Respiratory: Negative for chest tightness and shortness of breath.   Cardiovascular: Negative for chest pain.  Gastrointestinal: Positive for blood in stool. Negative for nausea, vomiting, abdominal pain, diarrhea, constipation and anal bleeding.  Neurological: Negative for dizziness, weakness and light-headedness.      Allergies  Pseudoephedrine; Codeine; Doxycycline; Nsaids; and Solifenacin succinate  Home Medications   Prior to Admission medications   Medication Sig Start Date End Date Taking? Authorizing Provider  acetaminophen (TYLENOL) 325 MG tablet Take 2 tablets (650 mg total) by mouth 3 (three) times daily. 04/22/14  Yes Christina P Rama, MD  Amino Acids-Protein Hydrolys (FEEDING SUPPLEMENT, PRO-STAT SUGAR FREE 64,) LIQD Take 30 mLs by mouth 2 (two) times daily.   Yes Historical Provider, MD  aspirin 325 MG tablet Take 325 mg by mouth daily.   Yes Historical Provider, MD  AZOPT 1 % ophthalmic suspension Place 1 drop into the left eye Twice daily.  05/10/12  Yes Historical Provider, MD  benzonatate (TESSALON) 200 MG capsule Take 1 capsule (200 mg total) by mouth 2 (two) times daily. 04/22/14  Yes Venetia Maxon Rama, MD  Calcium Carbonate-Vitamin D (CALCIUM 600+D) 600-400 MG-UNIT per tablet Take 1 tablet by mouth daily.   Yes Historical Provider, MD  citalopram (CELEXA) 10 MG  tablet Take 10 mg by mouth daily.   Yes Historical Provider, MD  COMBIGAN 0.2-0.5 % ophthalmic solution Place 1 drop into both eyes 2 (two) times daily.  06/06/13  Yes Historical Provider, MD  donepezil (ARICEPT) 5 MG tablet Take 1 tablet (5 mg total) by mouth at bedtime. 10/11/13  Yes Biagio Borg, MD  dorzolamide-timolol  (COSOPT) 22.3-6.8 MG/ML ophthalmic solution Place 1 drop into both eyes 2 (two) times daily.   Yes Historical Provider, MD  enoxaparin (LOVENOX) 40 MG/0.4ML injection Inject 40 mg into the skin daily.   Yes Historical Provider, MD  feeding supplement, ENSURE COMPLETE, (ENSURE COMPLETE) LIQD Take 237 mLs by mouth 2 (two) times daily between meals. 04/22/14  Yes Christina P Rama, MD  furosemide (LASIX) 40 MG tablet Take 40 mg by mouth.   Yes Historical Provider, MD  ipratropium-albuterol (DUONEB) 0.5-2.5 (3) MG/3ML SOLN Take 3 mLs by nebulization every 4 (four) hours as needed. 04/22/14  Yes Christina P Rama, MD  latanoprost (XALATAN) 0.005 % ophthalmic solution Place into the left eye at bedtime. 12/24/12  Yes Historical Provider, MD  losartan (COZAAR) 50 MG tablet Take 50 mg by mouth daily.   Yes Historical Provider, MD  memantine (NAMENDA) 5 MG tablet Take 1 tablet (5 mg total) by mouth 2 (two) times daily. 10/11/13  Yes Biagio Borg, MD  metoCLOPramide (REGLAN) 5 MG tablet Take 5 mg by mouth 2 (two) times daily.   Yes Historical Provider, MD  Multiple Vitamin (MULTIVITAMIN WITH MINERALS) TABS tablet Take 1 tablet by mouth daily.   Yes Historical Provider, MD  omeprazole (PRILOSEC) 20 MG capsule TAKE ONE CAPSULE BY MOUTH EVERY DAY 03/25/14  Yes Biagio Borg, MD  polyethylene glycol [facility] / GLYCOLAX) packet Take 17 g by mouth daily. 04/22/14  Yes Venetia Maxon Rama, MD  simethicone (MYLICON) 40 QZ/0.0PQ drops Take 1.2 mLs (80 mg total) by mouth 3 (three) times daily. 04/22/14  Yes Venetia Maxon Rama, MD  warfarin (COUMADIN) 6 MG tablet Take 6 mg by mouth at bedtime.   Yes Historical Provider, MD  bisacodyl (DULCOLAX) 10 MG suppository Place 1 suppository (10 mg total) rectally daily as needed for moderate constipation. 04/22/14   Christina P Rama, MD  hydrocortisone (ANUSOL-HC) 2.5 % rectal cream Place rectally 3 (three) times daily. 04/22/14   Venetia Maxon Rama, MD  levofloxacin (LEVAQUIN) 750 MG tablet  Take 1 tablet (750 mg total) by mouth daily. Patient not taking: Reported on 05/24/2014 04/22/14   Venetia Maxon Rama, MD  opium-belladonna (B&O SUPPRETTES) 16.2-60 MG suppository Place 1 suppository rectally every 6 (six) hours as needed for bladder spasms. 04/22/14   Christina P Rama, MD   BP 111/46 mmHg  Pulse 81  Temp(Src) 97.5 F (36.4 C) (Oral)  Resp 20  SpO2 99% Physical Exam  Constitutional: She is oriented to person, place, and time. She appears well-developed and well-nourished. No distress.  HENT:  Head: Normocephalic and atraumatic.  Pale mucus membranes noted  Eyes: Conjunctivae and EOM are normal. Pupils are equal, round, and reactive to light. Right eye exhibits no discharge. Left eye exhibits no discharge.  Pale conjunctiva  Neck: Normal range of motion. Neck supple. No tracheal deviation present.  Cardiovascular: Normal rate, regular rhythm and normal heart sounds.  Exam reveals no friction rub.   No murmur heard. Pulses:      Radial pulses are 2+ on the right side, and 2+ on the left side.       Dorsalis  pedis pulses are 2+ on the right side, and 2+ on the left side.  Negative swelling or pitting edema noted to the lower extremities bilaterally   Pulmonary/Chest: Effort normal and breath sounds normal. No respiratory distress. She has no wheezes. She has no rales. She exhibits no tenderness.  Patient is able to speak in full sentences without difficulty  Negative use of accessory muscles Negative stridor  Mild tachypnea noted on examination   Abdominal: Soft. Bowel sounds are normal. She exhibits no distension. There is tenderness. There is no rebound and no guarding.  Negative abdominal distension  BS normoactive in all 4 quadrants Abdomen soft upon palpation  Discomfort upon palpation to the right side of the abdomen  Negative guarding or rigidity noted Negative peritoneal signs   Genitourinary:  Rectal Exam: Negative swelling, erythema, inflammation, lesions,  sores hemorrhoids identified. Bright red blood noted near the anus - negative oozing or dripping noted. Sphincter tone strong. Bright red blood noted on rectal exam.  Exam chaperoned with tech, Candace   Musculoskeletal: Normal range of motion.  Lymphadenopathy:    She has no cervical adenopathy.  Neurological: She is alert and oriented to person, place, and time. No cranial nerve deficit. She exhibits normal muscle tone. Coordination normal.  Skin: Skin is warm and dry. No rash noted. She is not diaphoretic. No erythema.  Psychiatric: She has a normal mood and affect. Her behavior is normal. Thought content normal.  Nursing note and vitals reviewed.   ED Course  Procedures (including critical care time)  Results for orders placed or performed during the hospital encounter of 05/24/14  CBC with Differential  Result Value Ref Range   WBC 26.1 (H) 4.0 - 10.5 K/uL   RBC 1.89 (L) 3.87 - 5.11 MIL/uL   Hemoglobin 5.5 (LL) 12.0 - 15.0 g/dL   HCT 18.8 (L) 36.0 - 46.0 %   MCV 99.5 78.0 - 100.0 fL   MCH 29.1 26.0 - 34.0 pg   MCHC 29.3 (L) 30.0 - 36.0 g/dL   RDW 17.0 (H) 11.5 - 15.5 %   Platelets 92 (L) 150 - 400 K/uL   Neutrophils Relative % 13 (L) 43 - 77 %   Lymphocytes Relative 83 (H) 12 - 46 %   Monocytes Relative 3 3 - 12 %   Eosinophils Relative 1 0 - 5 %   Basophils Relative 0 0 - 1 %   Neutro Abs 3.4 1.7 - 7.7 K/uL   Lymphs Abs 21.6 (H) 0.7 - 4.0 K/uL   Monocytes Absolute 0.8 0.1 - 1.0 K/uL   Eosinophils Absolute 0.3 0.0 - 0.7 K/uL   Basophils Absolute 0.0 0.0 - 0.1 K/uL   RBC Morphology POLYCHROMASIA PRESENT    WBC Morphology ABSOLUTE LYMPHOCYTOSIS   Comprehensive metabolic panel  Result Value Ref Range   Sodium 139 135 - 145 mmol/L   Potassium 3.9 3.5 - 5.1 mmol/L   Chloride 97 96 - 112 mEq/L   CO2 39 (H) 19 - 32 mmol/L   Glucose, Bld 134 (H) 70 - 99 mg/dL   BUN 30 (H) 6 - 23 mg/dL   Creatinine, Ser 0.46 (L) 0.50 - 1.10 mg/dL   Calcium 8.1 (L) 8.4 - 10.5 mg/dL   Total  Protein 5.3 (L) 6.0 - 8.3 g/dL   Albumin 3.2 (L) 3.5 - 5.2 g/dL   AST 31 0 - 37 U/L   ALT 27 0 - 35 U/L   Alkaline Phosphatase 48 39 - 117 U/L  Total Bilirubin 0.5 0.3 - 1.2 mg/dL   GFR calc non Af Amer 86 (L) >90 mL/min   GFR calc Af Amer >90 >90 mL/min   Anion gap 3 (L) 5 - 15  Lipase, blood  Result Value Ref Range   Lipase 27 11 - 59 U/L  Urinalysis, Routine w reflex microscopic  Result Value Ref Range   Color, Urine YELLOW YELLOW   APPearance TURBID (A) CLEAR   Specific Gravity, Urine 1.019 1.005 - 1.030   pH 7.5 5.0 - 8.0   Glucose, UA NEGATIVE NEGATIVE mg/dL   Hgb urine dipstick SMALL (A) NEGATIVE   Bilirubin Urine NEGATIVE NEGATIVE   Ketones, ur NEGATIVE NEGATIVE mg/dL   Protein, ur NEGATIVE NEGATIVE mg/dL   Urobilinogen, UA 0.2 0.0 - 1.0 mg/dL   Nitrite NEGATIVE NEGATIVE   Leukocytes, UA LARGE (A) NEGATIVE  Brain natriuretic peptide  Result Value Ref Range   B Natriuretic Peptide 386.1 (H) 0.0 - 100.0 pg/mL  Protime-INR  Result Value Ref Range   Prothrombin Time 25.1 (H) 11.6 - 15.2 seconds   INR 2.25 (H) 0.00 - 1.49  Urine microscopic-add on  Result Value Ref Range   WBC, UA TOO NUMEROUS TO COUNT <3 WBC/hpf   RBC / HPF 3-6 <3 RBC/hpf   Bacteria, UA MANY (A) RARE   Urine-Other MICROSCOPIC EXAM PERFORMED ON UNCONCENTRATED URINE   Blood gas, arterial  Result Value Ref Range   O2 Content 3.0 L/min   Delivery systems NASAL CANNULA    pH, Arterial 7.385 7.350 - 7.450   pCO2 arterial 60.0 (HH) 35.0 - 45.0 mmHg   pO2, Arterial 82.2 80.0 - 100.0 mmHg   Bicarbonate 35.1 (H) 20.0 - 24.0 mEq/L   TCO2 34.8 0 - 100 mmol/L   Acid-Base Excess 9.8 (H) 0.0 - 2.0 mmol/L   O2 Saturation 93.1 %   Patient temperature 98.6    Collection site RIGHT RADIAL    Drawn by [id number]    Sample type ARTERIAL DRAW    Allens test (pass/fail) PASS PASS  I-Stat CG4 Lactic Acid, ED  Result Value Ref Range   Lactic Acid, Venous 1.00 0.5 - 2.2 mmol/L  I-stat chem 8, ed  Result Value Ref  Range   Sodium 141 135 - 145 mmol/L   Potassium 4.0 3.5 - 5.1 mmol/L   Chloride 94 (L) 96 - 112 mEq/L   BUN 27 (H) 6 - 23 mg/dL   Creatinine, Ser 0.70 0.50 - 1.10 mg/dL   Glucose, Bld 130 (H) 70 - 99 mg/dL   Calcium, Ion 1.09 (L) 1.13 - 1.30 mmol/L   TCO2 32 0 - 100 mmol/L   Hemoglobin 6.8 (LL) 12.0 - 15.0 g/dL   HCT 20.0 (L) 36.0 - 46.0 %  POC occult blood, ED RN will collect  Result Value Ref Range   Fecal Occult Bld POSITIVE (A) NEGATIVE  I-stat troponin, ED  Result Value Ref Range   Troponin i, poc 0.01 0.00 - 0.08 ng/mL   Comment 3          CBG monitoring, ED  Result Value Ref Range   Glucose-Capillary 113 (H) 70 - 99 mg/dL  Type and screen  Result Value Ref Range   ABO/RH(D) A POS    Antibody Screen NEG    Sample Expiration 05/27/2014    Unit Number [id number]    Blood Component Type RED CELLS,LR    Unit division 00    Status of Unit ISSUED    Transfusion Status  OK TO TRANSFUSE    Crossmatch Result Compatible    Unit Number [id number]    Blood Component Type RED CELLS,LR    Unit division 00    Status of Unit ALLOCATED    Transfusion Status OK TO TRANSFUSE    Crossmatch Result Compatible   Prepare RBC  Result Value Ref Range   Order Confirmation ORDER PROCESSED BY BLOOD BANK   Prepare fresh frozen plasma  Result Value Ref Range   Unit Number [id number]    Blood Component Type THAWED PLASMA    Unit division 00    Status of Unit ALLOCATED    Transfusion Status OK TO TRANSFUSE     Labs Review Labs Reviewed  CBC WITH DIFFERENTIAL - Abnormal; Notable for the following:    WBC 26.1 (*)    RBC 1.89 (*)    Hemoglobin 5.5 (*)    HCT 18.8 (*)    MCHC 29.3 (*)    RDW 17.0 (*)    Platelets 92 (*)    Neutrophils Relative % 13 (*)    Lymphocytes Relative 83 (*)    Lymphs Abs 21.6 (*)    All other components within normal limits  COMPREHENSIVE METABOLIC PANEL - Abnormal; Notable for the following:    CO2 39 (*)    Glucose, Bld 134 (*)    BUN 30 (*)     Creatinine, Ser 0.46 (*)    Calcium 8.1 (*)    Total Protein 5.3 (*)    Albumin 3.2 (*)    GFR calc non Af Amer 86 (*)    Anion gap 3 (*)    All other components within normal limits  URINALYSIS, ROUTINE W REFLEX MICROSCOPIC - Abnormal; Notable for the following:    APPearance TURBID (*)    Hgb urine dipstick SMALL (*)    Leukocytes, UA LARGE (*)    All other components within normal limits  BRAIN NATRIURETIC PEPTIDE - Abnormal; Notable for the following:    B Natriuretic Peptide 386.1 (*)    All other components within normal limits  PROTIME-INR - Abnormal; Notable for the following:    Prothrombin Time 25.1 (*)    INR 2.25 (*)    All other components within normal limits  URINE MICROSCOPIC-ADD ON - Abnormal; Notable for the following:    Bacteria, UA MANY (*)    All other components within normal limits  BLOOD GAS, ARTERIAL - Abnormal; Notable for the following:    pCO2 arterial 60.0 (*)    Bicarbonate 35.1 (*)    Acid-Base Excess 9.8 (*)    All other components within normal limits  I-STAT CHEM 8, ED - Abnormal; Notable for the following:    Chloride 94 (*)    BUN 27 (*)    Glucose, Bld 130 (*)    Calcium, Ion 1.09 (*)    Hemoglobin 6.8 (*)    HCT 20.0 (*)    All other components within normal limits  POC OCCULT BLOOD, ED - Abnormal; Notable for the following:    Fecal Occult Bld POSITIVE (*)    All other components within normal limits  CBG MONITORING, ED - Abnormal; Notable for the following:    Glucose-Capillary 113 (*)    All other components within normal limits  URINE CULTURE  MRSA PCR SCREENING  LIPASE, BLOOD  PATHOLOGIST SMEAR REVIEW  TSH  COMPREHENSIVE METABOLIC PANEL  MAGNESIUM  PHOSPHORUS  CBC WITH DIFFERENTIAL  APTT  PROTIME-INR  TSH  I-STAT CG4 LACTIC ACID,  ED  I-STAT TROPOININ, ED  TYPE AND SCREEN  PREPARE RBC (CROSSMATCH)  PREPARE FRESH FROZEN PLASMA    Imaging Review Dg Chest 2 View  05/24/2014   CLINICAL DATA:  Rectal bleeding.   EXAM: CHEST  2 VIEW  COMPARISON:  04/21/2014 and 07/31/2013  FINDINGS: Lungs are adequately inflated without focal consolidation or effusion. There is minimal blunting of the left costophrenic angle which may be slightly worse as cannot exclude a small amount left pleural fluid there is mild stable cardiomegaly. There is calcified plaque over the aortic arch. Two previous kyphoplasties over the lower thoracic and upper lumbar spine unchanged.  IMPRESSION: Subtle blunting of the left costophrenic angle slightly worse and may be due to a small amount of pleural fluid.  Stable cardiomegaly.   Electronically Signed   By: Marin Olp M.D.   On: 05/24/2014 07:25   Ct Abdomen Pelvis W Contrast  05/24/2014   CLINICAL DATA:  Pale complexion.  Rectal bleeding.  EXAM: CT ABDOMEN AND PELVIS WITH CONTRAST  TECHNIQUE: Multidetector CT imaging of the abdomen and pelvis was performed using the standard protocol following bolus administration of intravenous contrast.  CONTRAST:  62mL OMNIPAQUE IOHEXOL 300 MG/ML SOLN, 198mL OMNIPAQUE IOHEXOL 300 MG/ML SOLN  COMPARISON:  04/16/2014  FINDINGS: Small right pleural effusion. Dependent atelectasis at both lung bases.  Stable scattered liver cysts.  Left renal cysts is stable.  Stable renal hypodensities.  Spleen is within normal limits. Pancreas is within normal limits. Unremarkable gallbladder.  The cecum is in the right upper quadrant anterior to the liver. Terminal ileum and appendix are unremarkable.  Extensive sigmoid diverticulosis. Prominent stool and gas burden in the rectum.  Abnormal mesenteric and retroperitoneal adenopathy has improved. Index left para-aortic node on image 39 is 10 mm and was previously 12 mm by my direct measurements. Inguinal lymph nodes are also smaller.  Bladder is decompressed. Uterus is absent. Adnexa are unremarkable.  There is gas in the subcutaneous fat of the left flank which may be related to an injection.  Small hiatal hernia.  Stable lumbar  spine.  IMPRESSION: Abnormal retroperitoneal and mesenteric adenopathy has improved supporting benign etiology.  Right pleural effusion and basilar atelectasis.  Chronic changes.   Electronically Signed   By: Maryclare Bean M.D.   On: 05/24/2014 08:55     EKG Interpretation   Date/Time:  Saturday May 24 2014 06:12:58 EST Ventricular Rate:  84 PR Interval:    QRS Duration: 94 QT Interval:  382 QTC Calculation: 451 R Axis:   69 Text Interpretation:  Normal sinus rhythm Low voltage, precordial leads  Abnormal R-wave progression, late transition Nonspecific T abnormalities,  lateral leads Confirmed by Glynn Octave [id number]) on 05/24/2014  6:19:09 AM       8:13 AM This provider spoke with patient's granddaughter at bedside. Reported that patient just started the Coumadin approximately 7 days ago for a suspected blood clot in the left knee. Granddaughter reported that patient had a colonoscopy done approximately 6 months ago - as per granddaughter reported that it was unremarkable.   8:37 AM Patient became a little diaphoretic and lethargic. Had an episode of confusion that lasted approximately 2 minutes. Patient came to and responded to questions - knew where she was, the individuals in the room, granddaughter, and what was going on.   8:42 AM Extensive bleeding per rectum - more so than on initial examination. Bright red blood.   8:42 AM This provider spoke with Dr. Neil Crouch -  [facility] - discussed case, labs, imaging, vitals, ED course in great detail. Patient to be admitted to [facility] for acute GI bleed.  8:50 AM Dr. Neil Crouch called back and recommended Critical Care to be consulted regarding case.   8:53 AM Attending at bedside, Dr. Maryanna Shape.  8:56 AM This provider spoke with Critical Care, Dr. Elsworth Soho. Discussed case in great detail. Patient to be admitted under Critical Care.   8:57 AM Dr. Deatra Ina, GI - spoke with physician in great detail regarding patient. Patient  to be seen by GI.   9:03 PM Spoke with Dr. Charlies Silvers - stated that patient is DNR. Recommended Critical Care to Consult. Discussed that GI has been spoken to and that Dr. Deatra Ina is aware and stated that patient to be seen.  9:17 AM Spoke with Dr. Elsworth Soho, Critical Care. Discussed case in great detail and that patient is DNR - as per admitting physician's recommendation would like Critical Care to consult. Critical Care understood and agreed to plan.     CRITICAL CARE Performed by: Jamse Mead   Total critical care time: 45  Critical care time was exclusive of separately billable procedures and treating other patients.  Critical care was necessary to treat or prevent imminent or life-threatening deterioration.  Critical care was time spent personally by me on the following activities: development of treatment plan with patient and/or surrogate as well as nursing, discussions with consultants, evaluation of patient's response to treatment, examination of patient, obtaining history from patient or surrogate, ordering and performing treatments and interventions, ordering and review of laboratory studies, ordering and review of radiographic studies, pulse oximetry and re-evaluation of patient's condition.   MDM   Final diagnoses:  Hypoxia  Gastrointestinal hemorrhage, unspecified gastritis, unspecified gastrointestinal hemorrhage type  Bleeding on Coumadin  UTI (lower urinary tract infection)    Medications  dorzolamide-timolol (COSOPT) 22.3-6.8 MG/ML ophthalmic solution 1 drop (not administered)  0.9 %  sodium chloride infusion (not administered)  acetaminophen (TYLENOL) tablet 650 mg (not administered)    Or  acetaminophen (TYLENOL) suppository 650 mg (not administered)  ondansetron (ZOFRAN) tablet 4 mg (not administered)    Or  ondansetron (ZOFRAN) injection 4 mg (not administered)  pantoprazole (PROTONIX) 80 mg in sodium chloride 0.9 % 100 mL IVPB (0 mg Intravenous Stopped 05/24/14  0759)  sodium chloride 0.9 % bolus 1,000 mL (0 mLs Intravenous Stopped 05/24/14 0727)  0.9 %  sodium chloride infusion (10 mL/hr Intravenous New Bag/Given 05/24/14 0645)  iohexol (OMNIPAQUE) 300 MG/ML solution 50 mL (50 mLs Oral Contrast Given 05/24/14 0632)  piperacillin-tazobactam (ZOSYN) IVPB 3.375 g (0 g Intravenous Stopped 05/24/14 0759)  iohexol (OMNIPAQUE) 300 MG/ML solution 100 mL (100 mLs Intravenous Contrast Given 05/24/14 0826)  phytonadione (VITAMIN K) 5 mg in dextrose 5 % 50 mL IVPB (5 mg Intravenous New Bag/Given 05/24/14 0846)   Filed Vitals:   05/24/14 0900 05/24/14 0901 05/24/14 0915 05/24/14 0930  BP: 111/43 111/43 119/57 111/46  Pulse: 89  84 81  Temp:      TempSrc:      Resp: 25  25 20   SpO2: 95% 94% 94% 99%   EKG normal sinus rhythm with heart rate of 84 bpm, abnormal R-wave progression. I-STAT troponin negative elevation. BNP 386.1. INR 2.25. Lactic acid 1.00. Hemoglobin 5.5. CMP mildly elevated BUN of 30, creatinine 0.46. Carbon dioxide elevated at 39. Anion gap low, 3.0 mEq per liter with a glucose level of 134. Lipase negative elevation. Fecal occult positive. Urinalysis noted large  leukocytes with white blood cell count to numerous to count with many bacteria-urine culture pending. Chest xray noted subtle blunting of the left costophrenic angle slightly worse, due to pleural fluid. CT abdomen and pelvis with contrast negative for acute abnormalities.  Plan arrival to the ED patient has been placed on IV fluids and IV Protonix administered. Patient started on IV Zosyn secondary to UTI. Patient placed on oxygen therapy secondary to being tachypneic and drop in pulse ox upon arrival to the ED - 78% on room air - patient placed on 3 L via nasal cannula with increase to 98%. Type and screen ordered. Patient transfused 2 Units of RBC while in the ED setting. Patient started on IV vitamin K and FFP for reversal of Warfarin for GI bleed. Patient to be admitted to the hospital -  StepDown unit for acute GI bleed. Critical care and GI consulted - both specialities to assess patient. Patient DNR.   Jamse Mead, PA-C 05/24/14 [place], MD 05/24/14 1356

## 2014-05-24 NOTE — H&P (Addendum)
[facility] History and Physical  Audrey Spencer BXI:[id number] DOB: 12-Oct-1922 DOA: 05/24/2014  Referring physician: ER physician PCP: Audrey [name], MD   Chief Complaint: blood per rectum  HPI:  80 year old female with history of dementia, DVT on anticoagulation with coumadin, NHL (diagnosed in July 1993) initially treated with single agent cladribine achieving a durable remission lasting over 10 years. Disease transformed to chronic lymphocytic leukemia in 2010. She was treated with Bendamustine/Rituxan for 5 cycles between 08/18/2008 and 12/10/2008 achieving a complete hematologic and radiographic response. Patient was also diagnosed with a primary stage I ER positive cancer of the left breast in April 1998 treated with lumpectomy, radiation and tamoxifen. Mammogram in 08/06/2012 showed no evidence of malignancy. Today she presented to [facility] ED [facility] with sudden onset bright red blood per rectum for past 24 hours prior to this admission. Patient is not a good historian due to history of dementia. No respiratory distress. No fevers or chills. No nausea, vomiting or abdominal pain. In ED, BP was 108/91, HR 86, RR 18-25, T max 98 F and oxygen saturation of 78% on room air. It has improved to 98% with [place] oxygen support. Her blood work revealed hemoglobin of 5.5, platelets of 92, WBC count 26.1, INR 2.25. She was given vitamin K total of 10 mg IV, FFP and 2 units of PRBC. CCM and GI have seen the patient in consultation. Additionally ,she was found to have UTI and was started on zosyn on admission.  Assessment & Plan    Principal Problem:   Acute gastrointestinal hemorrhage / Anemia of chronic disease / Bleeding on Coumadin  Bleeding likely diverticular. Appreciate GI seeing the patient and their recommendations.   Triggering agent likely coumadin. Hold coumadin.   Patient has received FFP x once, vitamin K 10 mg IV, so far 2 units PRBC transfused.  Follow up post transfusion  hemoglobin.  Continue IV fluids, Protonix 40 mg IV Q 12 hours. Active Problems:   UTI (urinary tract infection) / Leukocytosis  Large leukocytes seen on UA. Pt started on zosyn   Follow up urine culture results  Leukocytosis also likely due to CLL   Acute respiratory failure with hypoxia   Stable cardiomegaly seen on CXR. BNP on admission mildly elevated at 386. Hypoxia may be from acute blood loss anemia. Resp status stable.    GERD  Started protonix 40 gm IV Q 12 hours    CLL (chronic lymphocytic leukemia) / Stage I breast cancer  Dr. Alvy Bimler is patient's oncologist. Will inform of patient's admission.    Thrombocytopenia  Likely due to malignancy and GI bleed, coumadin   Monitor CBC daily    Malnutrition of moderate degree  Nutrition consulted    History of DVT (deep vein thrombosis)  Coumadin on hold     DVT prophylaxis:   SCD's bilaterally due to risk of bleeding   Radiological Exams on Admission: Dg Chest 2 View 05/24/2014  Subtle blunting of the left costophrenic angle slightly worse and may be due to a small amount of pleural fluid.  Stable cardiomegaly.     Ct Abdomen Pelvis W Contrast 05/24/2014   Abnormal retroperitoneal and mesenteric adenopathy has improved supporting benign etiology.  Right pleural effusion and basilar atelectasis.  Chronic changes.      Code Status: DNR/DNI Family Communication: Plan of care discussed with the patient  Disposition Plan: Admit for further evaluation  [name], MD  [facility] Pager [phone]  Review of Systems:  Constitutional: Negative for fever, chills and positive for malaise/fatigue. Negative for diaphoresis.  HENT: Negative for hearing loss, ear pain, nosebleeds, congestion, sore throat, neck pain, tinnitus and ear discharge.   Eyes: Negative for blurred vision, double vision, photophobia, pain, discharge and redness.  Respiratory: Negative for cough, hemoptysis, sputum production, shortness of  breath, wheezing and stridor.   Cardiovascular: Negative for chest pain, palpitations, orthopnea, claudication and leg swelling.  Gastrointestinal: per HPI.  Genitourinary: Negative for dysuria, urgency, frequency, hematuria and flank pain.  Musculoskeletal: Negative for myalgias, back pain, joint pain and falls.  Skin: Negative for itching and rash.  Neurological: positive for weakness, no tremors, no sensation loss. Endo/Heme/Allergies: Negative for environmental allergies and polydipsia. Does not bruise/bleed easily.  Psychiatric/Behavioral: Negative for suicidal ideas. The patient is not nervous/anxious.      Past Medical History  Diagnosis Date  . Dementia 01/25/2011  . CLL 09/22/2009  . Personal history of malignant neoplasm of breast 09/22/2009  . ANEMIA-IRON DEFICIENCY 04/09/2007  . Bronchiectasis without acute exacerbation 01/07/2009  . GERD 02/26/2008  . HYPERLIPIDEMIA 04/09/2007  . OBSTRUCTIVE SLEEP APNEA 04/22/2008  . Pancytopenia 03/19/2009  . Recurrent UTI 01/25/2011  . DIVERTICULOSIS, COLON 04/15/2010  . GLAUCOMA 04/09/2007  . 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 OSTEOPOROSIS 03/19/2009  . RECTOCELE WITHOUT MENTION OF UTERINE PROLAPSE 04/15/2010  . Depression 05/03/2012  . Stage I breast cancer 06/29/2012    Left breast stage 1 dx 08/1996;  ER positive Rx lumpectomy/RT/Tamoxifen   Past Surgical History  Procedure Laterality Date  . Breast surgery      lumpectomy  . Abdominal hysterectomy    . Rotater cuff     Social History:  reports that she has never smoked. She has never used smokeless tobacco. She reports that she does not drink alcohol or use illicit drugs.  Allergies  Allergen Reactions  . Pseudoephedrine Other (See Comments)    insomnia  . Codeine     REACTION: nausea (mild)  . Doxycycline     REACTION: Nausea  . Nsaids     REACTION: GI bleed/anemia  . Solifenacin Succinate     REACTION: blurred vision    Family History:  Family History  Problem  Relation Age of Onset  . Hypertension Father   . Cancer Sister   . Cancer Brother      Prior to Admission medications   Medication Sig Start Date End Date Taking? Authorizing Provider  acetaminophen (TYLENOL) 325 MG tablet Take 2 tablets (650 mg total) by mouth 3 (three) times daily. 04/22/14  Yes Christina P Rama, MD  Amino Acids-Protein Hydrolys (FEEDING SUPPLEMENT, PRO-STAT SUGAR FREE 64,) LIQD Take 30 mLs by mouth 2 (two) times daily.   Yes Historical Provider, MD  aspirin 325 MG tablet Take 325 mg by mouth daily.   Yes Historical Provider, MD  AZOPT 1 % ophthalmic suspension Place 1 drop into the left eye Twice daily.  05/10/12  Yes Historical Provider, MD  benzonatate (TESSALON) 200 MG capsule Take 1 capsule (200 mg total) by mouth 2 (two) times daily. 04/22/14  Yes Venetia Maxon Rama, MD  Calcium Carbonate-Vitamin D (CALCIUM 600+D) 600-400 MG-UNIT per tablet Take 1 tablet by mouth daily.   Yes Historical Provider, MD  citalopram (CELEXA) 10 MG tablet Take 10 mg by mouth daily.   Yes Historical Provider, MD  COMBIGAN 0.2-0.5 % ophthalmic solution Place 1 drop into both eyes 2 (two) times daily.  06/06/13  Yes Historical Provider, MD  donepezil (ARICEPT) 5  MG tablet Take 1 tablet (5 mg total) by mouth at bedtime. 10/11/13  Yes Biagio Borg, MD  dorzolamide-timolol (COSOPT) 22.3-6.8 MG/ML ophthalmic solution Place 1 drop into both eyes 2 (two) times daily.   Yes Historical Provider, MD  enoxaparin (LOVENOX) 40 MG/0.4ML injection Inject 40 mg into the skin daily.   Yes Historical Provider, MD  feeding supplement, ENSURE COMPLETE, (ENSURE COMPLETE) LIQD Take 237 mLs by mouth 2 (two) times daily between meals. 04/22/14  Yes Christina P Rama, MD  furosemide (LASIX) 40 MG tablet Take 40 mg by mouth.   Yes Historical Provider, MD  ipratropium-albuterol (DUONEB) 0.5-2.5 (3) MG/3ML SOLN Take 3 mLs by nebulization every 4 (four) hours as needed. 04/22/14  Yes Christina P Rama, MD  latanoprost (XALATAN)  0.005 % ophthalmic solution Place into the left eye at bedtime. 12/24/12  Yes Historical Provider, MD  losartan (COZAAR) 50 MG tablet Take 50 mg by mouth daily.   Yes Historical Provider, MD  memantine (NAMENDA) 5 MG tablet Take 1 tablet (5 mg total) by mouth 2 (two) times daily. 10/11/13  Yes Biagio Borg, MD  metoCLOPramide (REGLAN) 5 MG tablet Take 5 mg by mouth 2 (two) times daily.   Yes Historical Provider, MD  Multiple Vitamin (MULTIVITAMIN WITH MINERALS) TABS tablet Take 1 tablet by mouth daily.   Yes Historical Provider, MD  omeprazole (PRILOSEC) 20 MG capsule TAKE ONE CAPSULE BY MOUTH EVERY DAY 03/25/14  Yes Biagio Borg, MD  polyethylene glycol [facility] / GLYCOLAX) packet Take 17 g by mouth daily. 04/22/14  Yes Venetia Maxon Rama, MD  simethicone (MYLICON) 40 UV/2.5DG drops Take 1.2 mLs (80 mg total) by mouth 3 (three) times daily. 04/22/14  Yes Venetia Maxon Rama, MD  warfarin (COUMADIN) 6 MG tablet Take 6 mg by mouth at bedtime.   Yes Historical Provider, MD  bisacodyl (DULCOLAX) 10 MG suppository Place 1 suppository (10 mg total) rectally daily as needed for moderate constipation. 04/22/14   Christina P Rama, MD  hydrocortisone (ANUSOL-HC) 2.5 % rectal cream Place rectally 3 (three) times daily. 04/22/14   Venetia Maxon Rama, MD  levofloxacin (LEVAQUIN) 750 MG tablet Take 1 tablet (750 mg total) by mouth daily. Patient not taking: Reported on 05/24/2014 04/22/14   Venetia Maxon Rama, MD  opium-belladonna (B&O SUPPRETTES) 16.2-60 MG suppository Place 1 suppository rectally every 6 (six) hours as needed for bladder spasms. 04/22/14   Venetia Maxon Rama, MD   Physical Exam: Filed Vitals:   05/24/14 1500 05/24/14 1549 05/24/14 1600 05/24/14 1700  BP: 100/53  92/60 97/51  Pulse: 78  79 81  Temp:  97.9 F (36.6 C)    TempSrc:  Oral    Resp: 26  27 29   Height:      Weight:      SpO2: 96%  90% 95%    Physical Exam  Constitutional: Appears stated age, appears ill, pale  HENT: Normocephalic. No  tonsillar erythema or exudates Eyes: Conjunctivae are pale. PERRLA, no scleral icterus.  Neck: Neck supple. No tracheal deviation. No thyromegaly.  CVS: RRR, S1/S2 appreciated. Pulmonary: Effort and breath sounds normal, no stridor, rhonchi, wheezes, rales.  Abdominal: Soft. BS +,  no distension, tenderness, rebound or guarding.  Musculoskeletal: Normal range of motion. No edema and no tenderness.  Lymphadenopathy: No lymphadenopathy noted, cervical, inguinal. Neuro: Alert. No focal neurologic deficits. Skin: Skin is warm and dry. No rash noted.  No erythema. No pallor.  Psychiatric: Normal mood and affect. Behavior, judgment, thought  content normal.   Labs on Admission:  Basic Metabolic Panel:  Recent Labs Lab 05/24/14 0610 05/24/14 0624  NA 139 141  K 3.9 4.0  CL 97 94*  CO2 39*  --   GLUCOSE 134* 130*  BUN 30* 27*  CREATININE 0.46* 0.70  CALCIUM 8.1*  --    Liver Function Tests:  Recent Labs Lab 05/24/14 0610  AST 31  ALT 27  ALKPHOS 48  BILITOT 0.5  PROT 5.3*  ALBUMIN 3.2*    Recent Labs Lab 05/24/14 0610  LIPASE 27   No results for input(s): AMMONIA in the last 168 hours. CBC:  Recent Labs Lab 05/24/14 0610 05/24/14 0624  WBC 26.1*  --   NEUTROABS 3.4  --   HGB 5.5* 6.8*  HCT 18.8* 20.0*  MCV 99.5  --   PLT 92*  --    Cardiac Enzymes: No results for input(s): CKTOTAL, CKMB, CKMBINDEX, TROPONINI in the last 168 hours. BNP: Invalid input(s): POCBNP CBG:  Recent Labs Lab 05/24/14 0713  GLUCAP 113*    If 7PM-7AM, please contact night-coverage [email or web address] Password [facility] 05/24/2014, 5:12 PM

## 2014-05-24 NOTE — ED Notes (Signed)
Pt placed on 3L via [place].  

## 2014-05-24 NOTE — Consult Note (Signed)
Referring Provider: No ref. provider found Primary Care Physician:  [name], MD Primary Gastroenterologist:  Dr. Olevia Perches  Reason for Consultation:  GI bleeding  HPI: Audrey Spencer is a 80 y.o. female with PMHx of dementia, CLL, breast cancer with lumpectomy, iron deficiency anemia, GERD, hyperlipidemia, glaucoma, currently on Coumadin and lovenox for DVT (these were recently started within the past 1-2 weeks).  She presented to the ED from [place] regarding GI bleeding that occurred this morning. As per patient, reported that there was bright red blood in the commode this morning-reported that it was all over the commode. Stated that she had a normal bowel movement yesterday.  Upon evaluation in the ED her Hgb was 5.5 grams.  Baseline is 8-9 gram range.  Platelets 92, WBC count 26.1.  BUN is elevated at 30.  CT scan does not show any acute/new issues.  Has history of diverticulosis by previous colonoscopy.  Last GI endoscopies were 2006.   Past Medical History  Diagnosis Date  . Dementia 01/25/2011  . CLL 09/22/2009  . Personal history of malignant neoplasm of breast 09/22/2009  . ANEMIA-IRON DEFICIENCY 04/09/2007  . Bronchiectasis without acute exacerbation 01/07/2009  . GERD 02/26/2008  . HYPERLIPIDEMIA 04/09/2007  . OBSTRUCTIVE SLEEP APNEA 04/22/2008  . Pancytopenia 03/19/2009  . Recurrent UTI 01/25/2011  . DIVERTICULOSIS, COLON 04/15/2010  . GLAUCOMA 04/09/2007  . 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 OSTEOPOROSIS 03/19/2009  . RECTOCELE WITHOUT MENTION OF UTERINE PROLAPSE 04/15/2010  . Depression 05/03/2012  . Stage I breast cancer 06/29/2012    Left breast stage 1 dx 08/1996;  ER positive Rx lumpectomy/RT/Tamoxifen    Past Surgical History  Procedure Laterality Date  . Breast surgery      lumpectomy  . Abdominal hysterectomy    . Rotater cuff      Prior to Admission medications   Medication Sig Start Date End Date Taking? Authorizing Provider    acetaminophen (TYLENOL) 325 MG tablet Take 2 tablets (650 mg total) by mouth 3 (three) times daily. 04/22/14  Yes Christina P Rama, MD  Amino Acids-Protein Hydrolys (FEEDING SUPPLEMENT, PRO-STAT SUGAR FREE 64,) LIQD Take 30 mLs by mouth 2 (two) times daily.   Yes Historical Provider, MD  aspirin 325 MG tablet Take 325 mg by mouth daily.   Yes Historical Provider, MD  AZOPT 1 % ophthalmic suspension Place 1 drop into the left eye Twice daily.  05/10/12  Yes Historical Provider, MD  benzonatate (TESSALON) 200 MG capsule Take 1 capsule (200 mg total) by mouth 2 (two) times daily. 04/22/14  Yes Venetia Maxon Rama, MD  Calcium Carbonate-Vitamin D (CALCIUM 600+D) 600-400 MG-UNIT per tablet Take 1 tablet by mouth daily.   Yes Historical Provider, MD  citalopram (CELEXA) 10 MG tablet Take 10 mg by mouth daily.   Yes Historical Provider, MD  COMBIGAN 0.2-0.5 % ophthalmic solution Place 1 drop into both eyes 2 (two) times daily.  06/06/13  Yes Historical Provider, MD  donepezil (ARICEPT) 5 MG tablet Take 1 tablet (5 mg total) by mouth at bedtime. 10/11/13  Yes Biagio Borg, MD  dorzolamide-timolol (COSOPT) 22.3-6.8 MG/ML ophthalmic solution Place 1 drop into both eyes 2 (two) times daily.   Yes Historical Provider, MD  enoxaparin (LOVENOX) 40 MG/0.4ML injection Inject 40 mg into the skin daily.   Yes Historical Provider, MD  feeding supplement, ENSURE COMPLETE, (ENSURE COMPLETE) LIQD Take 237 mLs by mouth 2 (two) times daily between meals. 04/22/14  Yes Christina P  Rama, MD  furosemide (LASIX) 40 MG tablet Take 40 mg by mouth.   Yes Historical Provider, MD  ipratropium-albuterol (DUONEB) 0.5-2.5 (3) MG/3ML SOLN Take 3 mLs by nebulization every 4 (four) hours as needed. 04/22/14  Yes Christina P Rama, MD  latanoprost (XALATAN) 0.005 % ophthalmic solution Place into the left eye at bedtime. 12/24/12  Yes Historical Provider, MD  losartan (COZAAR) 50 MG tablet Take 50 mg by mouth daily.   Yes Historical Provider, MD   memantine (NAMENDA) 5 MG tablet Take 1 tablet (5 mg total) by mouth 2 (two) times daily. 10/11/13  Yes Biagio Borg, MD  metoCLOPramide (REGLAN) 5 MG tablet Take 5 mg by mouth 2 (two) times daily.   Yes Historical Provider, MD  Multiple Vitamin (MULTIVITAMIN WITH MINERALS) TABS tablet Take 1 tablet by mouth daily.   Yes Historical Provider, MD  omeprazole (PRILOSEC) 20 MG capsule TAKE ONE CAPSULE BY MOUTH EVERY DAY 03/25/14  Yes Biagio Borg, MD  polyethylene glycol [facility] / GLYCOLAX) packet Take 17 g by mouth daily. 04/22/14  Yes Venetia Maxon Rama, MD  simethicone (MYLICON) 40 BS/9.6GE drops Take 1.2 mLs (80 mg total) by mouth 3 (three) times daily. 04/22/14  Yes Venetia Maxon Rama, MD  warfarin (COUMADIN) 6 MG tablet Take 6 mg by mouth at bedtime.   Yes Historical Provider, MD  bisacodyl (DULCOLAX) 10 MG suppository Place 1 suppository (10 mg total) rectally daily as needed for moderate constipation. 04/22/14   Christina P Rama, MD  hydrocortisone (ANUSOL-HC) 2.5 % rectal cream Place rectally 3 (three) times daily. 04/22/14   Venetia Maxon Rama, MD  levofloxacin (LEVAQUIN) 750 MG tablet Take 1 tablet (750 mg total) by mouth daily. Patient not taking: Reported on 05/24/2014 04/22/14   Venetia Maxon Rama, MD  opium-belladonna (B&O SUPPRETTES) 16.2-60 MG suppository Place 1 suppository rectally every 6 (six) hours as needed for bladder spasms. 04/22/14   Venetia Maxon Rama, MD    Current Facility-Administered Medications  Medication Dose Route Frequency Provider Last Rate Last Dose  . phytonadione (VITAMIN K) 5 mg in dextrose 5 % 50 mL IVPB  5 mg Intravenous Once Marissa Sciacca, PA-C 50 mL/hr at 05/24/14 0846 5 mg at 05/24/14 3662   Current Outpatient Prescriptions  Medication Sig Dispense Refill  . acetaminophen (TYLENOL) 325 MG tablet Take 2 tablets (650 mg total) by mouth 3 (three) times daily.    . Amino Acids-Protein Hydrolys (FEEDING SUPPLEMENT, PRO-STAT SUGAR FREE 64,) LIQD Take 30 mLs by mouth  2 (two) times daily.    Marland Kitchen aspirin 325 MG tablet Take 325 mg by mouth daily.    . AZOPT 1 % ophthalmic suspension Place 1 drop into the left eye Twice daily.     . benzonatate (TESSALON) 200 MG capsule Take 1 capsule (200 mg total) by mouth 2 (two) times daily. 20 capsule 0  . Calcium Carbonate-Vitamin D (CALCIUM 600+D) 600-400 MG-UNIT per tablet Take 1 tablet by mouth daily.    . citalopram (CELEXA) 10 MG tablet Take 10 mg by mouth daily.    . COMBIGAN 0.2-0.5 % ophthalmic solution Place 1 drop into both eyes 2 (two) times daily.     Marland Kitchen donepezil (ARICEPT) 5 MG tablet Take 1 tablet (5 mg total) by mouth at bedtime. 90 tablet 3  . dorzolamide-timolol (COSOPT) 22.3-6.8 MG/ML ophthalmic solution Place 1 drop into both eyes 2 (two) times daily.    Marland Kitchen enoxaparin (LOVENOX) 40 MG/0.4ML injection Inject 40 mg into the skin daily.    Marland Kitchen  feeding supplement, ENSURE COMPLETE, (ENSURE COMPLETE) LIQD Take 237 mLs by mouth 2 (two) times daily between meals.    . furosemide (LASIX) 40 MG tablet Take 40 mg by mouth.    Marland Kitchen ipratropium-albuterol (DUONEB) 0.5-2.5 (3) MG/3ML SOLN Take 3 mLs by nebulization every 4 (four) hours as needed. 360 mL   . latanoprost (XALATAN) 0.005 % ophthalmic solution Place into the left eye at bedtime.    Marland Kitchen losartan (COZAAR) 50 MG tablet Take 50 mg by mouth daily.    . memantine (NAMENDA) 5 MG tablet Take 1 tablet (5 mg total) by mouth 2 (two) times daily. 60 tablet 11  . metoCLOPramide (REGLAN) 5 MG tablet Take 5 mg by mouth 2 (two) times daily.    . Multiple Vitamin (MULTIVITAMIN WITH MINERALS) TABS tablet Take 1 tablet by mouth daily.    Marland Kitchen omeprazole (PRILOSEC) 20 MG capsule TAKE ONE CAPSULE BY MOUTH EVERY DAY 60 capsule 11  . polyethylene glycol (MIRALAX / GLYCOLAX) packet Take 17 g by mouth daily. 14 each 0  . simethicone (MYLICON) 40 IW/9.7LG drops Take 1.2 mLs (80 mg total) by mouth 3 (three) times daily. 30 mL 0  . warfarin (COUMADIN) 6 MG tablet Take 6 mg by mouth at bedtime.      . bisacodyl (DULCOLAX) 10 MG suppository Place 1 suppository (10 mg total) rectally daily as needed for moderate constipation. 12 suppository 0  . hydrocortisone (ANUSOL-HC) 2.5 % rectal cream Place rectally 3 (three) times daily. 30 g 0  . levofloxacin (LEVAQUIN) 750 MG tablet Take 1 tablet (750 mg total) by mouth daily. (Patient not taking: Reported on 05/24/2014)    . opium-belladonna (B&O SUPPRETTES) 16.2-60 MG suppository Place 1 suppository rectally every 6 (six) hours as needed for bladder spasms. 12 suppository 0    Allergies as of 05/24/2014 - Review Complete 05/24/2014  Allergen Reaction Noted  . Pseudoephedrine Other (See Comments) 06/20/2011  . Codeine  04/09/2007  . Doxycycline    . Nsaids  04/09/2007  . Solifenacin succinate  04/09/2007    Family History  Problem Relation Age of Onset  . Hypertension Father   . Cancer Sister   . Cancer Brother     History   Social History  . Marital Status: Widowed    Spouse Name: N/A    Number of Children: N/A  . Years of Education: N/A   Occupational History  . Not on file.   Social History Main Topics  . Smoking status: Never Smoker   . Smokeless tobacco: Never Used  . Alcohol Use: No  . Drug Use: No  . Sexual Activity: Not on file   Other Topics Concern  . Not on file   Social History Narrative    Review of Systems: Ten point ROS is O/W negative except as mentioned in HPI.  Physical Exam: Vital signs in last 24 hours: Temp:  [97.5 F (36.4 C)-98 F (36.7 C)] 97.5 F (36.4 C) (12/26 0819) Pulse Rate:  [81-92] 92 (12/26 0838) Resp:  [18-30] 18 (12/26 0838) BP: (90-138)/(29-91) 111/43 mmHg (12/26 0901) SpO2:  [78 %-100 %] 94 % (12/26 0901)   General:  Alert, elderly and frail, pleasant and cooperative in NAD; pale Head:  Normocephalic and atraumatic. Eyes:  Sclera clear, no icterus.  Conjunctiva pale. Ears:  Normal auditory acuity. Mouth:  No deformity or lesions.   Lungs:  Clear throughout to  auscultation.   No wheezes, crackles, or rhonchi. Heart:  Regular rate and rhythm; no murmurs,  clicks, rubs, or gallops. Abdomen:  Soft, non-distended.  BS present.  Non-tender. Rectal:  Deferred.  Red blood per rectum per ED MD.  Msk:  Symmetrical without gross deformities. Pulses:  Normal pulses noted. Extremities:  Without clubbing or edema. Neurologic:  Alert and  oriented x4;  grossly normal neurologically. Skin:  Intact without significant lesions or rashes. Psych:  Alert and cooperative. Normal mood and affect.  Intake/Output this shift: Total I/O In: 560 [I.V.:500; Blood:60] Out: -   Lab Results:  Recent Labs  05/24/14 0610 05/24/14 0624  WBC 26.1*  --   HGB 5.5* 6.8*  HCT 18.8* 20.0*  PLT 92*  --    BMET  Recent Labs  05/24/14 0610 05/24/14 0624  NA 139 141  K 3.9 4.0  CL 97 94*  CO2 39*  --   GLUCOSE 134* 130*  BUN 30* 27*  CREATININE 0.46* 0.70  CALCIUM 8.1*  --    LFT  Recent Labs  05/24/14 0610  PROT 5.3*  ALBUMIN 3.2*  AST 31  ALT 27  ALKPHOS 48  BILITOT 0.5   PT/INR  Recent Labs  05/24/14 0700  LABPROT 25.1*  INR 2.25*   Studies/Results: Dg Chest 2 View  05/24/2014   CLINICAL DATA:  Rectal bleeding.  EXAM: CHEST  2 VIEW  COMPARISON:  04/21/2014 and 07/31/2013  FINDINGS: Lungs are adequately inflated without focal consolidation or effusion. There is minimal blunting of the left costophrenic angle which may be slightly worse as cannot exclude a small amount left pleural fluid there is mild stable cardiomegaly. There is calcified plaque over the aortic arch. Two previous kyphoplasties over the lower thoracic and upper lumbar spine unchanged.  IMPRESSION: Subtle blunting of the left costophrenic angle slightly worse and may be due to a small amount of pleural fluid.  Stable cardiomegaly.   Electronically Signed   By: Marin Olp M.D.   On: 05/24/2014 07:25   Ct Abdomen Pelvis W Contrast  05/24/2014   CLINICAL DATA:  Pale complexion.   Rectal bleeding.  EXAM: CT ABDOMEN AND PELVIS WITH CONTRAST  TECHNIQUE: Multidetector CT imaging of the abdomen and pelvis was performed using the standard protocol following bolus administration of intravenous contrast.  CONTRAST:  55mL OMNIPAQUE IOHEXOL 300 MG/ML SOLN, 119mL OMNIPAQUE IOHEXOL 300 MG/ML SOLN  COMPARISON:  04/16/2014  FINDINGS: Small right pleural effusion. Dependent atelectasis at both lung bases.  Stable scattered liver cysts.  Left renal cysts is stable.  Stable renal hypodensities.  Spleen is within normal limits. Pancreas is within normal limits. Unremarkable gallbladder.  The cecum is in the right upper quadrant anterior to the liver. Terminal ileum and appendix are unremarkable.  Extensive sigmoid diverticulosis. Prominent stool and gas burden in the rectum.  Abnormal mesenteric and retroperitoneal adenopathy has improved. Index left para-aortic node on image 39 is 10 mm and was previously 12 mm by my direct measurements. Inguinal lymph nodes are also smaller.  Bladder is decompressed. Uterus is absent. Adnexa are unremarkable.  There is gas in the subcutaneous fat of the left flank which may be related to an injection.  Small hiatal hernia.  Stable lumbar spine.  IMPRESSION: Abnormal retroperitoneal and mesenteric adenopathy has improved supporting benign etiology.  Right pleural effusion and basilar atelectasis.  Chronic changes.   Electronically Signed   By: Maryclare Bean M.D.   On: 05/24/2014 08:55    IMPRESSION:  -Gastrointestinal bleeding:  Clinically sounds diverticular but BUN is elevated as well. -Acute on chronic anemia:  Secondary to blood loss acutely.  Hgb 5.5 grams this AM compared to 8-9 gram baseline.  Receiving PRBC's. -CLL -History of DVT:  Currently on Coumadin and Lovenox (just started within the last 1-2 weeks). -Coumadin coagulopathy:  INR only 2.25 this AM but is going to receive FFP and Vitamin K.  PLAN: -Agree with FFP and PRBC's. -Monitor Hgb.    ZEHR,  JESSICA D.  05/24/2014, 9:10 AM  Pager number [phone]  GI Attending Note   Chart was reviewed and patient was examined. X-rays and lab were reviewed.    I agree with management and plans.  Suspect diverticular bleed in the setting of coagulopathy.  Clearly bleeding is worsened because of anticoagulants.  Plan to reevaluate after patient's coagulopathy has been reversed.  If bleeding continues at that point would proceed with nuclear medicine scan and, if positive, arteriogram.  Sandy Salaam. Deatra Ina, M.D., [facility] Cell [phone]

## 2014-05-24 NOTE — Progress Notes (Signed)
VASCULAR LAB PRELIMINARY  PRELIMINARY  PRELIMINARY  PRELIMINARY  Bilateral lower extremity venous Dopplers completed.    Preliminary report:  There is no DVT or SVT noted in the bilateral lower extremities.   Jasiah Buntin, RVT 05/24/2014, 1:53 PM

## 2014-05-24 NOTE — Consult Note (Signed)
Name: Audrey Spencer MRN: [id number] DOB: [birth year]    ADMISSION DATE:  05/24/2014 CONSULTATION DATE:  05/24/2014  REFERRING MD :  Audrey Silvers, MD  CHIEF COMPLAINT:  Bright red blood per rectum   SIGNIFICANT EVENTS  12/26 admitted for lower GI bleed  STUDIES:  CT abdomen 12/26 -decreased lymphadenopathy, extensive sigmoid diverticulosis   HISTORY OF PRESENT ILLNESS:  80 year old, nursing home resident, presented with sudden onset, painless bright red blood per rectum 1 day. Transiently hypotensive in the ED. Hemoglobin was 5.5, platelets 92K, INR 2.2 on Coumadin. She was transiently hypotensive, hence Audrey Spencer consulted. Now blood pressure improved after transfusing 1 unit of blood. PMH of CLL in remission, OSA noncompliant with C Pap, dementia and depression  She was recently admitted 11/18-11/24/15 for Enterobacter UTI and rectal pain treated with antibiotics. She was seen by the palliative care team and made DO NOT RESUSCITATE. 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  has past surgical history that includes Breast surgery; Abdominal hysterectomy; and rotater cuff. Prior to Admission medications   Medication Sig Start Date End Date Taking? Authorizing Provider  acetaminophen (TYLENOL) 325 MG  tablet Take 2 tablets (650 mg total) by mouth 3 (three) times daily. 04/22/14  Yes Audrey P Rama, MD  Amino Acids-Protein Hydrolys (FEEDING SUPPLEMENT, PRO-STAT SUGAR FREE 64,) LIQD Take 30 mLs by mouth 2 (two) times daily.   Yes Historical Provider, MD  aspirin 325 MG tablet Take 325 mg by mouth daily.   Yes Historical Provider, MD  AZOPT 1 % ophthalmic suspension Place 1 drop into the left eye Twice daily.  05/10/12  Yes Historical Provider, MD  benzonatate (TESSALON) 200 MG capsule Take 1 capsule (200 mg total) by mouth 2 (two) times daily. 04/22/14  Yes Audrey Maxon Rama, MD  Calcium Carbonate-Vitamin D (CALCIUM 600+D) 600-400 MG-UNIT per tablet Take 1 tablet by mouth daily.   Yes Historical Provider, MD  citalopram (CELEXA) 10 MG tablet Take 10 mg by mouth daily.   Yes Historical Provider, MD  COMBIGAN 0.2-0.5 % ophthalmic solution Place 1 drop into both eyes 2 (two) times daily.  06/06/13  Yes Historical Provider, MD  donepezil (ARICEPT) 5 MG tablet Take 1 tablet (5 mg total) by mouth at bedtime. 10/11/13  Yes Audrey Borg, MD  dorzolamide-timolol (COSOPT) 22.3-6.8 MG/ML ophthalmic solution Place 1 drop into both eyes 2 (two) times daily.   Yes Historical Provider, MD  enoxaparin (LOVENOX) 40 MG/0.4ML injection Inject 40 mg into the skin daily.   Yes Historical Provider, MD  feeding supplement, ENSURE COMPLETE, (ENSURE COMPLETE) LIQD Take 237 mLs by mouth 2 (two) times daily between meals. 04/22/14  Yes Audrey P Rama, MD  furosemide (LASIX) 40 MG tablet Take 40 mg by mouth.   Yes Historical Provider, MD  ipratropium-albuterol (DUONEB) 0.5-2.5 (3) MG/3ML SOLN Take 3 mLs by nebulization every 4 (four) hours as needed. 04/22/14  Yes Audrey P Rama, MD  latanoprost (XALATAN) 0.005 % ophthalmic solution Place into the left eye  at bedtime. 12/24/12  Yes Historical Provider, MD  losartan (COZAAR) 50 MG tablet Take 50 mg by mouth daily.   Yes Historical Provider, MD  memantine (NAMENDA) 5 MG tablet  Take 1 tablet (5 mg total) by mouth 2 (two) times daily. 10/11/13  Yes Audrey Borg, MD  metoCLOPramide (REGLAN) 5 MG tablet Take 5 mg by mouth 2 (two) times daily.   Yes Historical Provider, MD  Multiple Vitamin (MULTIVITAMIN WITH MINERALS) TABS tablet Take 1 tablet by mouth daily.   Yes Historical Provider, MD  omeprazole (PRILOSEC) 20 MG capsule TAKE ONE CAPSULE BY MOUTH EVERY DAY 03/25/14  Yes Audrey Borg, MD  polyethylene glycol [facility] / GLYCOLAX) packet Take 17 g by mouth daily. 04/22/14  Yes Audrey Maxon Rama, MD  simethicone (MYLICON) 40 AY/3.0ZS drops Take 1.2 mLs (80 mg total) by mouth 3 (three) times daily. 04/22/14  Yes Audrey Maxon Rama, MD  warfarin (COUMADIN) 6 MG tablet Take 6 mg by mouth at bedtime.   Yes Historical Provider, MD  bisacodyl (DULCOLAX) 10 MG suppository Place 1 suppository (10 mg total) rectally daily as needed for moderate constipation. 04/22/14   Audrey P Rama, MD  hydrocortisone (ANUSOL-HC) 2.5 % rectal cream Place rectally 3 (three) times daily. 04/22/14   Audrey Maxon Rama, MD  levofloxacin (LEVAQUIN) 750 MG tablet Take 1 tablet (750 mg total) by mouth daily. Patient not taking: Reported on 05/24/2014 04/22/14   Audrey Maxon Rama, MD  opium-belladonna (B&O SUPPRETTES) 16.2-60 MG suppository Place 1 suppository rectally every 6 (six) hours as needed for bladder spasms. 04/22/14   [place], MD   Allergies  Allergen Reactions  . Pseudoephedrine Other (See Comments)    insomnia  . Codeine     REACTION: nausea (mild)  . Doxycycline     REACTION: Nausea  . Nsaids     REACTION: GI bleed/anemia  . Solifenacin Succinate     REACTION: blurred vision    FAMILY HISTORY:  family history includes Cancer in her brother and sister; Hypertension in her father. SOCIAL HISTORY:  reports that she has never smoked. She has never used smokeless tobacco. She reports that she does not drink alcohol or use illicit drugs.  REVIEW OF SYSTEMS:   Constitutional:  Negative for fever, chills, weight loss, malaise/fatigue and diaphoresis.  HENT: Negative for hearing loss, ear pain, nosebleeds, congestion, sore throat, neck pain, tinnitus and ear discharge.   Eyes: Negative for blurred vision, double vision, photophobia, pain, discharge and redness.  Respiratory: Negative for cough, hemoptysis, sputum production, shortness of breath, wheezing and stridor.   Cardiovascular: Negative for chest pain, palpitations, orthopnea, claudication, leg swelling and PND.  Gastrointestinal: Negative for heartburn, nausea, vomiting, abdominal pain, diarrhea, constipation, blood in stool and melena.  Genitourinary: Negative for dysuria, urgency, frequency, hematuria and flank pain.  Musculoskeletal: Negative for myalgias, back pain, joint pain and falls.  Skin: Negative for itching and rash.  Neurological: Negative for dizziness, tingling, tremors, sensory change, speech change, focal weakness, seizures, loss of consciousness, weakness and headaches.  Endo/Heme/Allergies: Negative for environmental allergies and polydipsia. Does not bruise/bleed easily.  SUBJECTIVE:   VITAL SIGNS: Temp:  [97.5 F (36.4 C)-98 F (36.7 C)] 97.5 F (36.4 C) (12/26 0819) Pulse Rate:  [81-92] 81 (12/26 0930) Resp:  [18-30] 20 (12/26 0930) BP: (90-138)/(29-91) 111/46 mmHg (12/26 0930) SpO2:  [78 %-100 %] 99 % (12/26 0930)  PHYSICAL EXAMINATION: General:  Elderly woman, lying supine in bed, no acute distress Neuro:  Alert  awake nonfocal HEENT:  Flat neck veins, pallor plus Cardiovascular:  S1-S2 normal, soft systolic murmur at base Lungs:  Clear lungs Abdomen:  Soft nontender Musculoskeletal:  No edema Skin:  Pale, good pulses   Recent Labs Lab 05/24/14 0610 05/24/14 0624  NA 139 141  K 3.9 4.0  CL 97 94*  CO2 39*  --   BUN 30* 27*  CREATININE 0.46* 0.70  GLUCOSE 134* 130*    Recent Labs Lab 05/24/14 0610 05/24/14 0624  HGB 5.5* 6.8*  HCT 18.8* 20.0*  WBC 26.1*  --    PLT 92*  --    Dg Chest 2 View  05/24/2014   CLINICAL DATA:  Rectal bleeding.  EXAM: CHEST  2 VIEW  COMPARISON:  04/21/2014 and 07/31/2013  FINDINGS: Lungs are adequately inflated without focal consolidation or effusion. There is minimal blunting of the left costophrenic angle which may be slightly worse as cannot exclude a small amount left pleural fluid there is mild stable cardiomegaly. There is calcified plaque over the aortic arch. Two previous kyphoplasties over the lower thoracic and upper lumbar spine unchanged.  IMPRESSION: Subtle blunting of the left costophrenic angle slightly worse and may be due to a small amount of pleural fluid.  Stable cardiomegaly.   Electronically Signed   By: Marin Olp Spencer.D.   On: 05/24/2014 07:25   Ct Abdomen Pelvis W Contrast  05/24/2014   CLINICAL DATA:  Pale complexion.  Rectal bleeding.  EXAM: CT ABDOMEN AND PELVIS WITH CONTRAST  TECHNIQUE: Multidetector CT imaging of the abdomen and pelvis was performed using the standard protocol following bolus administration of intravenous contrast.  CONTRAST:  20mL OMNIPAQUE IOHEXOL 300 MG/ML SOLN, 111mL OMNIPAQUE IOHEXOL 300 MG/ML SOLN  COMPARISON:  04/16/2014  FINDINGS: Small right pleural effusion. Dependent atelectasis at both lung bases.  Stable scattered liver cysts.  Left renal cysts is stable.  Stable renal hypodensities.  Spleen is within normal limits. Pancreas is within normal limits. Unremarkable gallbladder.  The cecum is in the right upper quadrant anterior to the liver. Terminal ileum and appendix are unremarkable.  Extensive sigmoid diverticulosis. Prominent stool and gas burden in the rectum.  Abnormal mesenteric and retroperitoneal adenopathy has improved. Index left para-aortic node on image 39 is 10 mm and was previously 12 mm by my direct measurements. Inguinal lymph nodes are also smaller.  Bladder is decompressed. Uterus is absent. Adnexa are unremarkable.  There is gas in the subcutaneous fat of the  left flank which may be related to an injection.  Small hiatal hernia.  Stable lumbar spine.  IMPRESSION: Abnormal retroperitoneal and mesenteric adenopathy has improved supporting benign etiology.  Right pleural effusion and basilar atelectasis.  Chronic changes.   Electronically Signed   By: Maryclare Bean Spencer.D.   On: 05/24/2014 08:55    ASSESSMENT / PLAN:  Painless lower GI bleed Hemorrhagic shock Coagulopathy-due to Coumadin and Lovenox Recent DVT History of CLL in remission-thrombo-cytopenia  Recommend- Resuscitate with blood to target hemoglobin 8 and above Reverse INR with FFP and vitamin K If bleeding continues will need nuclear scan or angiogram-presumed diverticular bleed, however doubt that she is a candidate for surgery, may have to consider embolization in that setting Obviously anticoagulation will have to be held for a few weeks, may have to define need for IVC filter-we will repeat venous duplex in anticipation Defer all other care to triad who will be admitting physician   Kara Mead MD. FCCP.  Pulmonary & Critical care Pager [phone] If no response call [phone]     05/24/2014, 11:26 AM

## 2014-05-24 NOTE — ED Notes (Signed)
CRITICAL VALUE ALERT  Critical value received:  Hgb   Date of notification:  05/24/14  Time of notification:  0644  Critical value read back:Yes.    Nurse who received alert:  Edward Qualia, RN  MD notified (1st page):  Verbally informed Mable Fill, [place]  Time of first page:  [phone]  MD notified (2nd page):  Time of second page:  Responding MD:  Dr. Claudine Mouton placed transfusion orders.  Time MD responded:

## 2014-05-24 NOTE — ED Notes (Signed)
Patient transported from [place] with c/o rectal bleeding. Pt appears pale, A & O per EMS.

## 2014-05-24 NOTE — ED Notes (Signed)
CG4 Lactic Acid, Chem 8 was given to Dr. Claudine Mouton.

## 2014-05-24 NOTE — ED Notes (Signed)
Per [name] in lab, BNP to be added on to specimens previously sent down

## 2014-05-24 NOTE — ED Notes (Signed)
After returning from CT, pt went unresponsive and was confused. After a few minutes of arousing, pt returned back to her normal. Pt now back to normal baseline and VSS. Dr Doy Mince made aware of pt status and is at bedside

## 2014-05-24 NOTE — Progress Notes (Signed)
ANTIBIOTIC CONSULT NOTE - INITIAL  Pharmacy Consult for zosyn Indication: UTI  Allergies  Allergen Reactions  . Pseudoephedrine Other (See Comments)    insomnia  . Codeine     REACTION: nausea (mild)  . Doxycycline     REACTION: Nausea  . Nsaids     REACTION: GI bleed/anemia  . Solifenacin Succinate     REACTION: blurred vision    Patient Measurements: Height: 5\' 3"  (160 cm) Weight: 159 lb 9.8 oz (72.4 kg) IBW/kg (Calculated) : 52.4   Vital Signs: Temp: 97.9 F (36.6 C) (12/26 1549) Temp Source: Oral (12/26 1549) BP: 120/76 mmHg (12/26 1900) Pulse Rate: 91 (12/26 1900) Intake/Output from previous day:   Intake/Output from this shift:    Labs:  Recent Labs  05/24/14 0610 05/24/14 0624 05/24/14 1707  WBC 26.1*  --  25.0*  HGB 5.5* 6.8* 8.0*  PLT 92*  --  77*  CREATININE 0.46* 0.70 0.45*   Estimated Creatinine Clearance: 45.5 mL/min (by C-G formula based on Cr of 0.45). No results for input(s): VANCOTROUGH, VANCOPEAK, VANCORANDOM, GENTTROUGH, GENTPEAK, GENTRANDOM, TOBRATROUGH, TOBRAPEAK, TOBRARND, AMIKACINPEAK, AMIKACINTROU, AMIKACIN in the last 72 hours.   Microbiology: Recent Results (from the past 720 hour(s))  MRSA PCR Screening     Status: None   Collection Time: 05/24/14 10:01 AM  Result Value Ref Range Status   MRSA by PCR NEGATIVE NEGATIVE Final    Comment:        The GeneXpert MRSA Assay (FDA approved for NASAL specimens only), is one component of a comprehensive MRSA colonization surveillance program. It is not intended to diagnose MRSA infection nor to guide or monitor treatment for MRSA infections.     Medical History: Past Medical History  Diagnosis Date  . Dementia 01/25/2011  . CLL 09/22/2009  . Personal history of malignant neoplasm of breast 09/22/2009  . ANEMIA-IRON DEFICIENCY 04/09/2007  . Bronchiectasis without acute exacerbation 01/07/2009  . GERD 02/26/2008  . HYPERLIPIDEMIA 04/09/2007  . OBSTRUCTIVE SLEEP APNEA 04/22/2008   . Pancytopenia 03/19/2009  . Recurrent UTI 01/25/2011  . DIVERTICULOSIS, COLON 04/15/2010  . GLAUCOMA 04/09/2007  . 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 OSTEOPOROSIS 03/19/2009  . RECTOCELE WITHOUT MENTION OF UTERINE PROLAPSE 04/15/2010  . Depression 05/03/2012  . Stage I breast cancer 06/29/2012    Left breast stage 1 dx 08/1996;  ER positive Rx lumpectomy/RT/Tamoxifen     Assessment: 80 year old female with history of dementia, DVT on anticoagulation with coumadin.  Today she presented to [facility] ED from SNF with sudden onset bright red blood per rectum for past 24 hours prior to this admission.  Additionally ,she was found to have UTI , pharmacy consulted to dose zosyn for this.  Goal of Therapy:  Zosyn per renal function  Plan:   Zosyn 3.375mg  IV q8h EI  Follow renal function/ cultures/clinical course  Dolly Rias RPh 05/24/2014, 7:54 PM Pager [phone]

## 2014-05-25 DIAGNOSIS — J9601 Acute respiratory failure with hypoxia: Secondary | ICD-10-CM

## 2014-05-25 DIAGNOSIS — C9110 Chronic lymphocytic leukemia of B-cell type not having achieved remission: Secondary | ICD-10-CM

## 2014-05-25 LAB — TYPE AND SCREEN
ABO/RH(D): A POS
Antibody Screen: NEGATIVE
Unit division: 0
Unit division: 0

## 2014-05-25 LAB — COMPREHENSIVE METABOLIC PANEL
ALT: 28 U/L (ref 0–35)
AST: 33 U/L (ref 0–37)
Albumin: 3.2 g/dL — ABNORMAL LOW (ref 3.5–5.2)
Alkaline Phosphatase: 44 U/L (ref 39–117)
Anion gap: 5 (ref 5–15)
BILIRUBIN TOTAL: 0.4 mg/dL (ref 0.3–1.2)
BUN: 19 mg/dL (ref 6–23)
CHLORIDE: 106 meq/L (ref 96–112)
CO2: 31 mmol/L (ref 19–32)
CREATININE: 0.52 mg/dL (ref 0.50–1.10)
Calcium: 7.8 mg/dL — ABNORMAL LOW (ref 8.4–10.5)
GFR calc Af Amer: >90 mL/min (ref >90)
GFR calc non Af Amer: 82 mL/min — ABNORMAL LOW (ref >90)
Glucose, Bld: 128 mg/dL — ABNORMAL HIGH (ref 70–99)
POTASSIUM: 4.3 mmol/L (ref 3.5–5.1)
Sodium: 142 mmol/L (ref 135–145)
Total Protein: 5.1 g/dL — ABNORMAL LOW (ref 6.0–8.3)

## 2014-05-25 LAB — APTT: APTT: 26 s (ref 24–37)

## 2014-05-25 LAB — CBC
HEMATOCRIT: 24.9 % — AB (ref 36.0–46.0)
Hemoglobin: 7.6 g/dL — ABNORMAL LOW (ref 12.0–15.0)
MCH: 28.1 pg (ref 26.0–34.0)
MCHC: 30.5 g/dL (ref 30.0–36.0)
MCV: 92.2 fL (ref 78.0–100.0)
PLATELETS: 76 10*3/uL — AB (ref 150–400)
RBC: 2.7 MIL/uL — ABNORMAL LOW (ref 3.87–5.11)
RDW: 19.4 % — AB (ref 11.5–15.5)
WBC: 25.5 10*3/uL — AB (ref 4.0–10.5)

## 2014-05-25 LAB — GLUCOSE, CAPILLARY: Glucose-Capillary: 116 mg/dL — ABNORMAL HIGH (ref 70–99)

## 2014-05-25 LAB — PROTIME-INR
INR: 1.08 (ref 0.00–1.49)
Prothrombin Time: 14.2 seconds (ref 11.6–15.2)

## 2014-05-25 NOTE — Progress Notes (Signed)
[facility] Progress Note  Subjective:  No further bleeding per the patient.  Asking for something to eat or drink.  Objective:  Vital signs in last 24 hours: Temp:  [97.5 F (36.4 C)-98 F (36.7 C)] 97.8 F (36.6 C) (12/27 0400) Pulse Rate:  [67-92] 78 (12/27 0749) Resp:  [16-29] 23 (12/27 0749) BP: (89-126)/(41-87) 118/72 mmHg (12/27 0749) SpO2:  [87 %-100 %] 98 % (12/27 0749) Weight:  [159 lb 9.8 oz (72.4 kg)-162 lb 7.7 oz (73.7 kg)] 162 lb 7.7 oz (73.7 kg) (12/27 0400) Last BM Date: 05/25/14 General:  Alert, elderly and frail, in NAD Heart:  Regular rate and rhythm; no murmurs Pulm:  CTAB.  No W/R/R. Abdomen:  Soft, non-distended.  BS present.  Mild diffuse TTP.  Extremities:  Without edema.  Intake/Output from previous day: 12/26 0701 - 12/27 0700 In: 2215 [I.V.:2075; Blood:90; IV Piggyback:50] Out: -  Intake/Output this shift: Total I/O In: 125 [I.V.:75; IV Piggyback:50] Out: -   Lab Results:  Recent Labs  05/24/14 0610 05/24/14 0624 05/24/14 1707 05/25/14 0350  WBC 26.1*  --  25.0* 25.5*  HGB 5.5* 6.8* 8.0* 7.6*  HCT 18.8* 20.0* 25.6* 24.9*  PLT 92*  --  77* 76*   BMET  Recent Labs  05/24/14 0610 05/24/14 0624 05/24/14 1707 05/25/14 0350  NA 139 141 140 142  K 3.9 4.0 4.3 4.3  CL 97 94* 102 106  CO2 39*  --  33* 31  GLUCOSE 134* 130* 129* 128*  BUN 30* 27* 22 19  CREATININE 0.46* 0.70 0.45* 0.52  CALCIUM 8.1*  --  7.8* 7.8*   LFT  Recent Labs  05/25/14 0350  PROT 5.1*  ALBUMIN 3.2*  AST 33  ALT 28  ALKPHOS 44  BILITOT 0.4   PT/INR  Recent Labs  05/24/14 1707 05/25/14 0350  LABPROT 15.5* 14.2  INR 1.22 1.08   Dg Chest 2 View  05/24/2014   CLINICAL DATA:  Rectal bleeding.  EXAM: CHEST  2 VIEW  COMPARISON:  04/21/2014 and 07/31/2013  FINDINGS: Lungs are adequately inflated without focal consolidation or effusion. There is minimal blunting of the left costophrenic angle which may be slightly worse as cannot  exclude a small amount left pleural fluid there is mild stable cardiomegaly. There is calcified plaque over the aortic arch. Two previous kyphoplasties over the lower thoracic and upper lumbar spine unchanged.  IMPRESSION: Subtle blunting of the left costophrenic angle slightly worse and may be due to a small amount of pleural fluid.  Stable cardiomegaly.   Electronically Signed   By: Marin Olp M.D.   On: 05/24/2014 07:25   Ct Abdomen Pelvis W Contrast  05/24/2014   CLINICAL DATA:  Pale complexion.  Rectal bleeding.  EXAM: CT ABDOMEN AND PELVIS WITH CONTRAST  TECHNIQUE: Multidetector CT imaging of the abdomen and pelvis was performed using the standard protocol following bolus administration of intravenous contrast.  CONTRAST:  71mL OMNIPAQUE IOHEXOL 300 MG/ML SOLN, 17mL OMNIPAQUE IOHEXOL 300 MG/ML SOLN  COMPARISON:  04/16/2014  FINDINGS: Small right pleural effusion. Dependent atelectasis at both lung bases.  Stable scattered liver cysts.  Left renal cysts is stable.  Stable renal hypodensities.  Spleen is within normal limits. Pancreas is within normal limits. Unremarkable gallbladder.  The cecum is in the right upper quadrant anterior to the liver. Terminal ileum and appendix are unremarkable.  Extensive sigmoid diverticulosis. Prominent stool and gas burden in the rectum.  Abnormal mesenteric and retroperitoneal adenopathy  has improved. Index left para-aortic node on image 39 is 10 mm and was previously 12 mm by my direct measurements. Inguinal lymph nodes are also smaller.  Bladder is decompressed. Uterus is absent. Adnexa are unremarkable.  There is gas in the subcutaneous fat of the left flank which may be related to an injection.  Small hiatal hernia.  Stable lumbar spine.  IMPRESSION: Abnormal retroperitoneal and mesenteric adenopathy has improved supporting benign etiology.  Right pleural effusion and basilar atelectasis.  Chronic changes.   Electronically Signed   By: Maryclare Bean M.D.   On:  05/24/2014 08:55   Assessment / Plan: -Gastrointestinal bleeding: Clinically sounds diverticular. -Acute on chronic anemia: Secondary to blood loss acutely.  Hgb relatively stable after transfusion yesterday. -CLL -History of DVT: Currently on Coumadin and Lovenox (just started within the last 1-2 weeks).  CCM considering IVC filter, but repeat lower extremity duplex negative for clots. -Coumadin coagulopathy: INR 1.08 today after Vitamin K and FFP.     LOS: 1 day   ZEHR, JESSICA D.  05/25/2014, 9:25 AM  Pager number [phone]  Overt GI bleeding has stopped.  INR now normal.  Hold GI studies unless she starts to bleed again.  Would consider IVC filter if you anticipate restarting anticoagulation therapy.  Leukocytosis probably reflects her CLL.  May advance diet.

## 2014-05-25 NOTE — Progress Notes (Addendum)
Patient ID: ADALIZ DOBIS, female   DOB: 04/28/1923, 80 y.o.   MRN: [id number] [facility] PROGRESS NOTE  AZAIAH MELLO VZS:[id number] DOB: 1923-04-29 DOA: 05/24/2014 PCP: [name], MD  Brief narrative:    80 year old female with history of dementia, DVT on anticoagulation with coumadin, NHL (diagnosed in July 1993) initially treated with single agent cladribine achieving a durable remission lasting over 10 years. Disease transformed to chronic lymphocytic leukemia in 2010. She was treated with Bendamustine/Rituxan for 5 cycles between 08/18/2008 and 12/10/2008 achieving a complete hematologic and radiographic response. Patient was also diagnosed with a primary stage I ER positive cancer of the left breast in April 1998 treated with lumpectomy, radiation and tamoxifen. Mammogram in 08/06/2012 showed no evidence of malignancy. Today she presented to [facility] ED from SNF with sudden onset bright red blood per rectum for past 24 hours prior to this admission. In ED, BP was 108/91, HR 86, RR 18-25, T max 98 F and oxygen saturation of 78% on room air. It has improved to 98% with [place] oxygen support. Her blood work revealed hemoglobin of 5.5, platelets of 92, WBC count 26.1, INR 2.25. She was given vitamin K total of 10 mg IV, FFP and 2 units of PRBC. CCM and GI have seen the patient in consultation. Additionally ,she was found to have UTI and was started on zosyn on admission.    Assessment/Plan:     Principal Problem:  Acute gastrointestinal hemorrhage / Anemia of chronic disease / Bleeding on Coumadin  Bleeding likely diverticular. Appreciate GI following.   Patient has received FFP x once, vitamin K 10 mg IV, so far 2 units PRBC transfused.  Hemoglobin is 7.6 this morning.   Continue IV fluids, Protonix 40 mg IV Q 12 hours. Active Problems:  UTI (urinary tract infection) / Leukocytosis  Large leukocytes seen on UA. Continue zosyn.  Follow up urine culture  results  Leukocytosis also likely due to CLL  Acute respiratory failure with hypoxia  Hypoxia may be from acute blood loss anemia. Stable cardiomegaly seen on CXR. BNP on admission mildly elevated at 386.   GERD  Continue protonix 40 gm IV Q 12 hours  CLL (chronic lymphocytic leukemia) / Stage I breast cancer  Dr. Alvy Bimler is patient's oncologist. Will inform of patient's admission.  Thrombocytopenia  Likely due to malignancy and GI bleed, coumadin   Monitor CBC daily  Malnutrition of moderate degree  Nutrition consulted  History of DVT (deep vein thrombosis)  Coumadin on hold   DVT prophylaxis:   SCD's bilaterally due to risk of bleeding  Code Status: DNR/DNI Family Communication:  Family not at the bedside  Disposition Plan: transfer to telemetry floor   IV access:  Peripheral IV  Procedures and diagnostic studies:    Dg Chest 2 View 05/24/2014 Subtle blunting of the left costophrenic angle slightly worse and may be due to a small amount of pleural fluid. Stable cardiomegaly.   Ct Abdomen Pelvis W Contrast 05/24/2014 Abnormal retroperitoneal and mesenteric adenopathy has improved supporting benign etiology. Right pleural effusion and basilar atelectasis. Chronic changes.   Medical Consultants:  [facility], Dr. Erskine Emery  Other Consultants:  None   IAnti-Infectives:   Zosyn 05/24/2014 -->   [name], MD  [facility] Pager [phone]  If 7PM-7AM, please contact night-coverage [email or web address] Password [email or web address] 05/25/2014, 1:32 PM   LOS: 1 day    HPI/Subjective: No acute overnight events.  Objective: Filed Vitals:   05/25/14 0400 05/25/14 0749  05/25/14 0925 05/25/14 1127  BP: 123/68 118/72  141/62  Pulse: 80 78  81  Temp: 97.8 F (36.6 C)  97.6 F (36.4 C) 97.8 F (36.6 C)  TempSrc: Oral  Oral Oral  Resp: 21 23  22   Height:    5\' 3"  (1.6 m)  Weight: 73.7 kg (162 lb 7.7 oz)   76.7 kg (169 lb 1.5 oz)  SpO2:  100% 98%  93%    Intake/Output Summary (Last 24 hours) at 05/25/14 1332 Last data filed at 05/25/14 1000  Gross per 24 hour  Intake   1675 ml  Output      0 ml  Net   1675 ml    Exam:   General:  Pt is alert, follows commands appropriately, not in acute distress  Cardiovascular: Regular rate and rhythm, S1/S2, no murmurs  Respiratory: Clear to auscultation bilaterally, no wheezing, no crackles, no rhonchi  Abdomen: Soft, non tender, non distended, bowel sounds present  Extremities: No edema, pulses DP and PT palpable bilaterally  Neuro: Grossly nonfocal  Data Reviewed: Basic Metabolic Panel:  Recent Labs Lab 05/24/14 0610 05/24/14 0624 05/24/14 1707 05/25/14 0350  NA 139 141 140 142  K 3.9 4.0 4.3 4.3  CL 97 94* 102 106  CO2 39*  --  33* 31  GLUCOSE 134* 130* 129* 128*  BUN 30* 27* 22 19  CREATININE 0.46* 0.70 0.45* 0.52  CALCIUM 8.1*  --  7.8* 7.8*  MG  --   --  2.2  --   PHOS  --   --  3.4  --    Liver Function Tests:  Recent Labs Lab 05/24/14 0610 05/24/14 1707 05/25/14 0350  AST 31 31 33  ALT 27 24 28   ALKPHOS 48 48 44  BILITOT 0.5 0.6 0.4  PROT 5.3* 5.3* 5.1*  ALBUMIN 3.2* 3.3* 3.2*    Recent Labs Lab 05/24/14 0610  LIPASE 27   No results for input(s): AMMONIA in the last 168 hours. CBC:  Recent Labs Lab 05/24/14 0610 05/24/14 0624 05/24/14 1707 05/25/14 0350  WBC 26.1*  --  25.0* 25.5*  NEUTROABS 3.4  --  3.3  --   HGB 5.5* 6.8* 8.0* 7.6*  HCT 18.8* 20.0* 25.6* 24.9*  MCV 99.5  --  92.1 92.2  PLT 92*  --  77* 76*   Cardiac Enzymes: No results for input(s): CKTOTAL, CKMB, CKMBINDEX, TROPONINI in the last 168 hours. BNP: Invalid input(s): POCBNP CBG:  Recent Labs Lab 05/24/14 0713 05/25/14 0731  GLUCAP 113* 116*    Recent Results (from the past 240 hour(s))  Urine culture     Status: None (Preliminary result)   Collection Time: 05/24/14  6:28 AM  Result Value Ref Range Status   Specimen Description URINE, RANDOM   Final   Special Requests NONE  Final   Colony Count   Final    >=100,000 COLONIES/ML Performed at [facility]    Culture   Final    PROTEUS MIRABILIS Performed at [facility]    Report Status PENDING  Incomplete  MRSA PCR Screening     Status: None   Collection Time: 05/24/14 10:01 AM  Result Value Ref Range Status   MRSA by PCR NEGATIVE NEGATIVE Final    Comment:        The GeneXpert MRSA Assay (FDA approved for NASAL specimens only), is one component of a comprehensive MRSA colonization surveillance program. It is not intended to diagnose MRSA infection nor  to guide or monitor treatment for MRSA infections.      Scheduled Meds: . antiseptic oral rinse  7 mL Mouth Rinse BID  . dorzolamide-timolol  1 drop Both Eyes BID  . pantoprazole (PROTONIX) IV  40 mg Intravenous Q12H  . piperacillin-tazobactam (ZOSYN)  IV  3.375 g Intravenous 3 times per day   Continuous Infusions:

## 2014-05-25 NOTE — Progress Notes (Signed)
Patient had a bloody  Stool. Dark red blood/liquid.

## 2014-05-25 NOTE — Progress Notes (Signed)
Name: Audrey Spencer MRN: [id number] DOB: September 04, 1922    ADMISSION DATE:  05/24/2014 CONSULTATION DATE:  05/25/2014  REFERRING MD :  Charlies Silvers, MD  CHIEF COMPLAINT:  Bright red blood per rectum   SIGNIFICANT EVENTS  12/26 admitted for lower GI bleed  STUDIES:  CT abdomen 12/26 -decreased lymphadenopathy, extensive sigmoid diverticulosis 12/6 duplex neg DVT   HISTORY OF PRESENT ILLNESS:  80 year old, nursing home resident, presented with sudden onset, painless bright red blood per rectum 1 day. Transiently hypotensive in the ED. Hemoglobin was 5.5, platelets 92K, INR 2.2 on Coumadin. She was transiently hypotensive, hence [facility] consulted. Now blood pressure improved after transfusing 1 unit of blood. PMH of CLL in remission, OSA noncompliant with C Pap, dementia and depression  She was recently admitted 11/18-11/24/15 for Enterobacter UTI and rectal pain treated with antibiotics. She was seen by the palliative care team and made DO NOT RESUSCITATE. She was found to have DVT at the nursing home and was treated with Lovenox and Coumadin-about a week ago    SUBJECTIVE: No more bleeding Awake and alert Hemodynamically stable  VITAL SIGNS: Temp:  [97.5 F (36.4 C)-98 F (36.7 C)] 97.6 F (36.4 C) (12/27 0925) Pulse Rate:  [67-91] 78 (12/27 0749) Resp:  [16-29] 23 (12/27 0749) BP: (89-124)/(41-81) 118/72 mmHg (12/27 0749) SpO2:  [87 %-100 %] 98 % (12/27 0749) Weight:  [73.7 kg (162 lb 7.7 oz)] 73.7 kg (162 lb 7.7 oz) (12/27 0400)  PHYSICAL EXAMINATION: General:  Elderly woman, lying supine in bed, no acute distress Neuro:  Alert awake nonfocal HEENT:  Flat neck veins, pallor plus Cardiovascular:  S1-S2 normal, soft systolic murmur at base Lungs:  Clear lungs Abdomen:  Soft nontender Musculoskeletal:  No edema Skin:  Pale, good pulses   Recent Labs Lab 05/24/14 0610 05/24/14 0624 05/24/14 1707 05/25/14 0350  NA 139 141 140 142  K 3.9 4.0 4.3 4.3  CL 97 94* 102  106  CO2 39*  --  33* 31  BUN 30* 27* 22 19  CREATININE 0.46* 0.70 0.45* 0.52  GLUCOSE 134* 130* 129* 128*    Recent Labs Lab 05/24/14 0610 05/24/14 0624 05/24/14 1707 05/25/14 0350  HGB 5.5* 6.8* 8.0* 7.6*  HCT 18.8* 20.0* 25.6* 24.9*  WBC 26.1*  --  25.0* 25.5*  PLT 92*  --  77* 76*   Dg Chest 2 View  05/24/2014   CLINICAL DATA:  Rectal bleeding.  EXAM: CHEST  2 VIEW  COMPARISON:  04/21/2014 and 07/31/2013  FINDINGS: Lungs are adequately inflated without focal consolidation or effusion. There is minimal blunting of the left costophrenic angle which may be slightly worse as cannot exclude a small amount left pleural fluid there is mild stable cardiomegaly. There is calcified plaque over the aortic arch. Two previous kyphoplasties over the lower thoracic and upper lumbar spine unchanged.  IMPRESSION: Subtle blunting of the left costophrenic angle slightly worse and may be due to a small amount of pleural fluid.  Stable cardiomegaly.   Electronically Signed   By: Marin Olp M.D.   On: 05/24/2014 07:25   Ct Abdomen Pelvis W Contrast  05/24/2014   CLINICAL DATA:  Pale complexion.  Rectal bleeding.  EXAM: CT ABDOMEN AND PELVIS WITH CONTRAST  TECHNIQUE: Multidetector CT imaging of the abdomen and pelvis was performed using the standard protocol following bolus administration of intravenous contrast.  CONTRAST:  88mL OMNIPAQUE IOHEXOL 300 MG/ML SOLN, 126mL OMNIPAQUE IOHEXOL 300 MG/ML SOLN  COMPARISON:  04/16/2014  FINDINGS: Small right pleural effusion. Dependent atelectasis at both lung bases.  Stable scattered liver cysts.  Left renal cysts is stable.  Stable renal hypodensities.  Spleen is within normal limits. Pancreas is within normal limits. Unremarkable gallbladder.  The cecum is in the right upper quadrant anterior to the liver. Terminal ileum and appendix are unremarkable.  Extensive sigmoid diverticulosis. Prominent stool and gas burden in the rectum.  Abnormal mesenteric and  retroperitoneal adenopathy has improved. Index left para-aortic node on image 39 is 10 mm and was previously 12 mm by my direct measurements. Inguinal lymph nodes are also smaller.  Bladder is decompressed. Uterus is absent. Adnexa are unremarkable.  There is gas in the subcutaneous fat of the left flank which may be related to an injection.  Small hiatal hernia.  Stable lumbar spine.  IMPRESSION: Abnormal retroperitoneal and mesenteric adenopathy has improved supporting benign etiology.  Right pleural effusion and basilar atelectasis.  Chronic changes.   Electronically Signed   By: Maryclare Bean M.D.   On: 05/24/2014 08:55    ASSESSMENT / PLAN:  Painless lower GI bleed Hemorrhagic shock Coagulopathy-due to Coumadin and Lovenox Recent DVT -strangely venous Dupplex does not show this anymore History of CLL in remission-thrombo-cytopenia  Aim for target hemoglobin 8 and above Reversed INR with FFP and vitamin K If bleeding recurs will need nuclear scan or angiogram-presumed diverticular bleed, however doubt that she is a candidate for surgery, may have to consider embolization in that setting No need for IVC filter PCCM to sign off    Kara Mead MD. Shade Flood. [facility] Pager [phone] If no response call [phone]     05/25/2014, 11:24 AM

## 2014-05-26 DIAGNOSIS — N390 Urinary tract infection, site not specified: Secondary | ICD-10-CM | POA: Insufficient documentation

## 2014-05-26 LAB — URINE CULTURE: Colony Count: >=100000

## 2014-05-26 LAB — CBC
HCT: 23.5 % — ABNORMAL LOW (ref 36.0–46.0)
HEMOGLOBIN: 7.1 g/dL — AB (ref 12.0–15.0)
MCH: 28.4 pg (ref 26.0–34.0)
MCHC: 30.2 g/dL (ref 30.0–36.0)
MCV: 94 fL (ref 78.0–100.0)
Platelets: 81 10*3/uL — ABNORMAL LOW (ref 150–400)
RBC: 2.5 MIL/uL — AB (ref 3.87–5.11)
RDW: 19.2 % — ABNORMAL HIGH (ref 11.5–15.5)
WBC: 27.8 10*3/uL — ABNORMAL HIGH (ref 4.0–10.5)

## 2014-05-26 LAB — BASIC METABOLIC PANEL
Anion gap: 4 — ABNORMAL LOW (ref 5–15)
BUN: 15 mg/dL (ref 6–23)
CALCIUM: 8.1 mg/dL — AB (ref 8.4–10.5)
CO2: 32 mmol/L (ref 19–32)
Chloride: 105 mEq/L (ref 96–112)
Creatinine, Ser: 0.49 mg/dL — ABNORMAL LOW (ref 0.50–1.10)
GFR calc Af Amer: >90 mL/min (ref >90)
GFR calc non Af Amer: 84 mL/min — ABNORMAL LOW (ref >90)
GLUCOSE: 140 mg/dL — AB (ref 70–99)
Potassium: 3.8 mmol/L (ref 3.5–5.1)
SODIUM: 141 mmol/L (ref 135–145)

## 2014-05-26 LAB — GLUCOSE, CAPILLARY: GLUCOSE-CAPILLARY: 141 mg/dL — AB (ref 70–99)

## 2014-05-26 NOTE — Progress Notes (Signed)
Patient ID: Audrey Spencer, female   DOB: June 28, 1922, 80 y.o.   MRN: [id number] [facility] PROGRESS NOTE  LATRESHA YAHR WUJ:[id number] DOB: March 30, 1923 DOA: 05/24/2014 PCP: [name], MD  Brief narrative:    80 year old female with history of dementia, DVT on anticoagulation with coumadin, NHL (diagnosed in July 1993) initially treated with single agent cladribine achieving a durable remission lasting over 10 years. Disease transformed to chronic lymphocytic leukemia in 2010. She was treated with Bendamustine/Rituxan for 5 cycles between 08/18/2008 and 12/10/2008 achieving a complete hematologic and radiographic response. Patient was also diagnosed with a primary stage I ER positive cancer of the left breast in April 1998 treated with lumpectomy, radiation and tamoxifen. Mammogram in 08/06/2012 showed no evidence of malignancy. Today she presented to [facility] ED from SNF with sudden onset bright red blood per rectum for past 24 hours prior to this admission. In ED, BP was 108/91, HR 86, RR 18-25, T max 98 F and oxygen saturation of 78% on room air. It has improved to 98% with [place] oxygen support. Her blood work revealed hemoglobin of 5.5, platelets of 92, WBC count 26.1, INR 2.25. She was given vitamin K total of 10 mg IV, FFP and 2 units of PRBC. CCM and GI have seen the patient in consultation. Additionally,she was found to have UTI and was started on zosyn on admission.    Assessment/Plan:     Principal Problem:  Acute gastrointestinal hemorrhage / Anemia of chronic disease / Bleeding on Coumadin  Bleeding likely diverticular. Appreciate GI following.   Patient has received FFP x once, vitamin K 10 mg IV, so far 2 units PRBC transfused.  Hemoglobin is stable at 7.1. Continue to monitor CBC daily.   Continue Protonix 40 mg IV Q 12 hours. Active Problems: UTI (urinary tract infection) secondary to proteus mirabilis / Leukocytosis  Large leukocytes seen on UA. Urine culture is  growing proteus mirabilis species.   Continue zosyn until sens report available.    Acute respiratory failure with hypoxia  Hypoxia may be from acute blood loss anemia. Stable cardiomegaly seen on CXR. BNP on admission mildly elevated at 386.   Respiratory status stable.    GERD  Continue protonix 40 gm IV Q 12 hours   CLL (chronic lymphocytic leukemia) / Stage I breast cancer  Dr. Alvy Bimler is patient's oncologist.   Thrombocytopenia  Likely due to malignancy and GI bleed, coumadin   Platelets trending up   Malnutrition of moderate degree  Nutrition consulted   History of DVT (deep vein thrombosis)  Coumadin continues to be on hold.    DVT prophylaxis:   SCD's bilaterally due to risk of bleeding  Code Status: DNR/DNI Family Communication:  Family not at the bedside  Disposition Plan: to SNF when stable   IV access:  Peripheral IV  Procedures and diagnostic studies:    Dg Chest 2 View 05/24/2014 Subtle blunting of the left costophrenic angle slightly worse and may be due to a small amount of pleural fluid. Stable cardiomegaly.   Ct Abdomen Pelvis W Contrast 05/24/2014 Abnormal retroperitoneal and mesenteric adenopathy has improved supporting benign etiology. Right pleural effusion and basilar atelectasis. Chronic changes.   Medical Consultants:  [facility], Dr. Erskine Emery  Other Consultants:  None   IAnti-Infectives:   Zosyn 05/24/2014 -->  [name], MD  [facility] Pager [phone]  If 7PM-7AM, please contact night-coverage [email or web address] Password [email or web address] 05/26/2014, 8:14 AM   LOS: 2 days  HPI/Subjective: No acute overnight events.  Objective: Filed Vitals:   05/25/14 1416 05/25/14 2224 05/26/14 0500 05/26/14 0600  BP: 134/46 159/58  150/49  Pulse: 89 88  80  Temp: 97.4 F (36.3 C) 99.2 F (37.3 C)  97.5 F (36.4 C)  TempSrc: Oral   Oral  Resp: 20 20  20   Height:      Weight:   74.2 kg (163 lb 9.3  oz)   SpO2: 94% 97%  97%    Intake/Output Summary (Last 24 hours) at 05/26/14 0814 Last data filed at 05/25/14 1823  Gross per 24 hour  Intake 831.25 ml  Output      0 ml  Net 831.25 ml    Exam:   General:  Pt is alert, awake, oriented to person and place  Cardiovascular: Regular rate and rhythm, S1/S2 (+)  Respiratory: Clear to auscultation bilaterally, no wheezing, no crackles, no rhonchi  Abdomen: Soft, non tender, non distended, bowel sounds present  Extremities: trace LE pitting edema, pulses DP and PT palpable bilaterally  Neuro: Grossly nonfocal  Data Reviewed: Basic Metabolic Panel:  Recent Labs Lab 05/24/14 0610 05/24/14 0624 05/24/14 1707 05/25/14 0350 05/26/14 0508  NA 139 141 140 142 141  K 3.9 4.0 4.3 4.3 3.8  CL 97 94* 102 106 105  CO2 39*  --  33* 31 32  GLUCOSE 134* 130* 129* 128* 140*  BUN 30* 27* 22 19 15   CREATININE 0.46* 0.70 0.45* 0.52 0.49*  CALCIUM 8.1*  --  7.8* 7.8* 8.1*  MG  --   --  2.2  --   --   PHOS  --   --  3.4  --   --    Liver Function Tests:  Recent Labs Lab 05/24/14 0610 05/24/14 1707 05/25/14 0350  AST 31 31 33  ALT 27 24 28   ALKPHOS 48 48 44  BILITOT 0.5 0.6 0.4  PROT 5.3* 5.3* 5.1*  ALBUMIN 3.2* 3.3* 3.2*    Recent Labs Lab 05/24/14 0610  LIPASE 27   No results for input(s): AMMONIA in the last 168 hours. CBC:  Recent Labs Lab 05/24/14 0610 05/24/14 0624 05/24/14 1707 05/25/14 0350 05/26/14 0508  WBC 26.1*  --  25.0* 25.5* 27.8*  NEUTROABS 3.4  --  3.3  --   --   HGB 5.5* 6.8* 8.0* 7.6* 7.1*  HCT 18.8* 20.0* 25.6* 24.9* 23.5*  MCV 99.5  --  92.1 92.2 94.0  PLT 92*  --  77* 76* 81*   Cardiac Enzymes: No results for input(s): CKTOTAL, CKMB, CKMBINDEX, TROPONINI in the last 168 hours. BNP: Invalid input(s): POCBNP CBG:  Recent Labs Lab 05/24/14 0713 05/25/14 0731 05/26/14 0721  GLUCAP 113* 116* 141*    Recent Results (from the past 240 hour(s))  Urine culture     Status: None  (Preliminary result)   Collection Time: 05/24/14  6:28 AM  Result Value Ref Range Status   Specimen Description URINE, RANDOM  Final   Special Requests NONE  Final   Colony Count   Final   Culture   Final    PROTEUS MIRABILIS   Report Status PENDING  Incomplete  MRSA PCR Screening     Status: None   Collection Time: 05/24/14 10:01 AM  Result Value Ref Range Status   MRSA by PCR NEGATIVE NEGATIVE Final     Scheduled Meds: . antiseptic oral rinse  7 mL Mouth Rinse BID  . dorzolamide-timolol  1 drop Both Eyes BID  .  pantoprazole (PROTONIX) IV  40 mg Intravenous Q12H  . piperacillin-tazobactam (ZOSYN)  IV  3.375 g Intravenous 3 times per day   Continuous Infusions:

## 2014-05-26 NOTE — Progress Notes (Signed)
Patient have been sleepy, will stay awake enough to eat and go right back to sleep, SOB with any activity even when  Eating. Dr. Charlies Silvers notified.

## 2014-05-26 NOTE — Progress Notes (Signed)
    Progress Note   Subjective  She is tired.   Objective   Vital signs in last 24 hours: Temp:  [97.4 F (36.3 C)-99.2 F (37.3 C)] 97.5 F (36.4 C) (12/28 0600) Pulse Rate:  [80-89] 80 (12/28 0600) Resp:  [20-22] 20 (12/28 0600) BP: (134-159)/(46-62) 150/49 mmHg (12/28 0600) SpO2:  [93 %-97 %] 97 % (12/28 0600) Weight:  [163 lb 9.3 oz (74.2 kg)-169 lb 1.5 oz (76.7 kg)] 163 lb 9.3 oz (74.2 kg) (12/28 0500) Last BM Date: 05/25/14 General:    white female in NAD Heart:  Regular rate and rhythm Lungs: Respirations even , slightly labored Abdomen:  Soft, nontender and nondistended. Normal bowel sounds. Extremities:  Without edema. Neurologic:  Alert and oriented,  grossly normal neurologically. Psych:  Cooperative. Normal mood and affect.  Lab Results:  Recent Labs  05/24/14 1707 05/25/14 0350 05/26/14 0508  WBC 25.0* 25.5* 27.8*  HGB 8.0* 7.6* 7.1*  HCT 25.6* 24.9* 23.5*  PLT 77* 76* 81*   BMET  Recent Labs  05/24/14 1707 05/25/14 0350 05/26/14 0508  NA 140 142 141  K 4.3 4.3 3.8  CL 102 106 105  CO2 33* 31 32  GLUCOSE 129* 128* 140*  BUN 22 19 15   CREATININE 0.45* 0.52 0.49*  CALCIUM 7.8* 7.8* 8.1*   LFT  Recent Labs  05/25/14 0350  PROT 5.1*  ALBUMIN 3.2*  AST 33  ALT 28  ALKPHOS 44  BILITOT 0.4   PT/INR  Recent Labs  05/24/14 1707 05/25/14 0350  LABPROT 15.5* 14.2  INR 1.22 1.08      Assessment / Plan:   67. 80 year old female with painless hematochezia in setting of anticoagulation. Diverticular hemorrhage suspected. INR reversed, bleeding subsided. No need for IVC filter per CCM. Will coumadin be permanently discontinued given #3 ?  2. Anemia of acute blood loss. Got two units of blood for hgb of 5.5. Hgb 7.1 today. Transfusion volume of two units documented as only 24ml and 61ml but I did confirm with nurse that she got 2 full units.   3. Hx of DVT. Coumadin on hold for bleed. Lower extremity doppler study negative bilaterally.    4. Shortness of breath. On 02. CXR two days ago reveals subtle blunting of the left costophrenic angle slightly worse. Shortness of breath may also be due in part to anemia. Hospitalist managing  3. CLL   LOS: 2 days   Tye Savoy  05/26/2014, 9:10 AM   [facility] Attending  I have also seen and assessed the patient and agree with the above note.  Conservative management ongoing LGI bleed appears to be stopping off warfarin.  Gatha Mayer, MD, [facility] [phone] (pager) 05/26/2014 3:15 PM

## 2014-05-27 ENCOUNTER — Ambulatory Visit: Payer: Medicare Other | Admitting: Internal Medicine

## 2014-05-27 LAB — BASIC METABOLIC PANEL
Anion gap: 5 (ref 5–15)
BUN: 14 mg/dL (ref 6–23)
CHLORIDE: 103 meq/L (ref 96–112)
CO2: 35 mmol/L — ABNORMAL HIGH (ref 19–32)
CREATININE: 0.53 mg/dL (ref 0.50–1.10)
Calcium: 8.3 mg/dL — ABNORMAL LOW (ref 8.4–10.5)
GFR calc Af Amer: >90 mL/min (ref >90)
GFR calc non Af Amer: 82 mL/min — ABNORMAL LOW (ref >90)
GLUCOSE: 121 mg/dL — AB (ref 70–99)
Potassium: 3.8 mmol/L (ref 3.5–5.1)
Sodium: 143 mmol/L (ref 135–145)

## 2014-05-27 LAB — GLUCOSE, CAPILLARY: Glucose-Capillary: 117 mg/dL — ABNORMAL HIGH (ref 70–99)

## 2014-05-27 LAB — PREPARE FRESH FROZEN PLASMA: UNIT DIVISION: 0

## 2014-05-27 LAB — CBC
HEMATOCRIT: 25.8 % — AB (ref 36.0–46.0)
HEMOGLOBIN: 7.6 g/dL — AB (ref 12.0–15.0)
MCH: 28.3 pg (ref 26.0–34.0)
MCHC: 29.5 g/dL — ABNORMAL LOW (ref 30.0–36.0)
MCV: 95.9 fL (ref 78.0–100.0)
Platelets: 88 10*3/uL — ABNORMAL LOW (ref 150–400)
RBC: 2.69 MIL/uL — ABNORMAL LOW (ref 3.87–5.11)
RDW: 18.9 % — ABNORMAL HIGH (ref 11.5–15.5)
WBC: 29.4 10*3/uL — ABNORMAL HIGH (ref 4.0–10.5)

## 2014-05-27 MED ORDER — ONDANSETRON HCL 4 MG PO TABS: 4.0000 mg | ORAL_TABLET | Freq: Four times a day (QID) | ORAL | Status: AC | PRN

## 2014-05-27 MED ORDER — PANTOPRAZOLE SODIUM 40 MG PO TBEC: 40.0000 mg | DELAYED_RELEASE_TABLET | Freq: Every day | ORAL | Status: DC

## 2014-05-27 MED ORDER — AMOXICILLIN 500 MG PO CAPS: 500.0000 mg | ORAL_CAPSULE | Freq: Three times a day (TID) | ORAL | Status: DC

## 2014-05-27 NOTE — Progress Notes (Addendum)
ANTIBIOTIC CONSULT NOTE   Pharmacy Consult for zosyn Indication: UTI  Allergies  Allergen Reactions  . Pseudoephedrine Other (See Comments)    insomnia  . Codeine     REACTION: nausea (mild)  . Doxycycline     REACTION: Nausea  . Nsaids     REACTION: GI bleed/anemia  . Solifenacin Succinate     REACTION: blurred vision    Patient Measurements: Height: 5\' 3"  (160 cm) Weight: 164 lb 0.4 oz (74.4 kg) IBW/kg (Calculated) : 52.4   Vital Signs: Temp: 97.7 F (36.5 C) (12/29 0730) Temp Source: Oral (12/29 0730) BP: 144/58 mmHg (12/29 0730) Pulse Rate: 78 (12/29 0730) Intake/Output from previous day: 12/28 0701 - 12/29 0700 In: 930 [P.O.:830; IV Piggyback:100] Out: -  Intake/Output from this shift:    Labs:  Recent Labs  05/25/14 0350 05/26/14 0508 05/27/14 0720  WBC 25.5* 27.8* 29.4*  HGB 7.6* 7.1* 7.6*  PLT 76* 81* 88*  CREATININE 0.52 0.49* 0.53   Estimated Creatinine Clearance: 46.1 mL/min (by C-G formula based on Cr of 0.53). No results for input(s): VANCOTROUGH, VANCOPEAK, VANCORANDOM, GENTTROUGH, GENTPEAK, GENTRANDOM, TOBRATROUGH, TOBRAPEAK, TOBRARND, AMIKACINPEAK, AMIKACINTROU, AMIKACIN in the last 72 hours.   Microbiology: Recent Results (from the past 720 hour(s))  Urine culture     Status: None   Collection Time: 05/24/14  6:28 AM  Result Value Ref Range Status   Specimen Description URINE, RANDOM  Final   Special Requests NONE  Final   Colony Count   Final    >=100,000 COLONIES/ML Performed at [facility]    Culture   Final    PROTEUS MIRABILIS Performed at [facility]    Report Status 05/26/2014 FINAL  Final   Organism ID, Bacteria PROTEUS MIRABILIS  Final      Susceptibility   Proteus mirabilis - MIC*    AMPICILLIN <=2 SENSITIVE Sensitive     CEFAZOLIN <=4 SENSITIVE Sensitive     CEFTRIAXONE <=1 SENSITIVE Sensitive     CIPROFLOXACIN >=4 RESISTANT Resistant     GENTAMICIN <=1 SENSITIVE Sensitive     LEVOFLOXACIN >=8  RESISTANT Resistant     NITROFURANTOIN 128 RESISTANT Resistant     TOBRAMYCIN <=1 SENSITIVE Sensitive     TRIMETH/SULFA <=20 SENSITIVE Sensitive     PIP/TAZO <=4 SENSITIVE Sensitive     * PROTEUS MIRABILIS  MRSA PCR Screening     Status: None   Collection Time: 05/24/14 10:01 AM  Result Value Ref Range Status   MRSA by PCR NEGATIVE NEGATIVE Final    Comment:        The GeneXpert MRSA Assay (FDA approved for NASAL specimens only), is one component of a comprehensive MRSA colonization surveillance program. It is not intended to diagnose MRSA infection nor to guide or monitor treatment for MRSA infections.    Assessment: 80 year old female with history of dementia, DVT on anticoagulation with coumadin. Today she presented to [facility] ED from SNF with sudden onset bright red blood per rectum for past 24 hours prior to this admission. Additionally ,she was found to have UTI , pharmacy consulted to dose zosyn for this. Noted with UTIs from Enterobacter in Nov R only to cefazolin, and from pan-sens E. Coli, Enterobacter R cefazolin, amox/clav in Aug  12/26 >> zosyn >>  Tmax: AF since admit WBCs: elevated - trending up. (history of CLL) Renal: low baseline Scr and currently at baseline  12/26 urine: > 100k proteus mirabilis (R to quinolones, NTF) 12/26 MRSA: negative  Goal of Therapy:  Zosyn per renal function  Plan:  Day #3 zosyn  Zosyn 3.375mg  IV q8h over 4h infusion remains appropriate per renal function  Based on urine culture, suggest narrowing antibiotics (ex. PO - amoxicillin 500mg  TID for 5 additional days or cefazolin 1gm q8h if unable to take PO at this time)  Follow renal function/ cultures/clinical course  Doreene Eland, PharmD, BCPS.   Pager: [phone] 05/27/2014 8:15 AM

## 2014-05-27 NOTE — Progress Notes (Signed)
    Progress Note   Subjective   No complaints, just tired   Objective   Vital signs in last 24 hours: Temp:  [97.4 F (36.3 C)-97.8 F (36.6 C)] 97.7 F (36.5 C) (12/29 0730) Pulse Rate:  [78-81] 78 (12/29 0730) Resp:  [18] 18 (12/29 0730) BP: (110-144)/(49-91) 144/58 mmHg (12/29 0730) SpO2:  [95 %-99 %] 99 % (12/29 0730) Weight:  [164 lb 0.4 oz (74.4 kg)] 164 lb 0.4 oz (74.4 kg) (12/29 0500) Last BM Date: 05/27/14 General:    Pleasant white female in NAD Lungs: )2 per [place]. Respirations slightly labored. Decreased breath sounds / crackles bilateral lower lobes Abdomen:  Soft, nontender and nondistended. Normal bowel sounds. Extremities:  Trace BLE edema.  Neurologic:  Alert and oriented,  grossly normal neurologically. Psych:  Cooperative. Pleasant      Lab Results:  Recent Labs  05/25/14 0350 05/26/14 0508 05/27/14 0720  WBC 25.5* 27.8* 29.4*  HGB 7.6* 7.1* 7.6*  HCT 24.9* 23.5* 25.8*  PLT 76* 81* 88*   BMET  Recent Labs  05/25/14 0350 05/26/14 0508 05/27/14 0720  NA 142 141 143  K 4.3 3.8 3.8  CL 106 105 103  CO2 31 32 35*  GLUCOSE 128* 140* 121*  BUN 19 15 14   CREATININE 0.52 0.49* 0.53  CALCIUM 7.8* 8.1* 8.3*      Assessment / Plan:    20. 80 year old female with painless hematochezia in setting of anticoagulation. Diverticular hemorrhage suspected. INR reversed, bleeding resolved off coumadin.  2. Anemia of acute blood loss. Got two units of blood for hgb of 5.5. Hgb now stable at 7.6 today.   3. Hx of DVT. Coumadin on hold for bleed. Lower extremity doppler study negative bilaterally.   4. CLL    LOS: 3 days   Tye Savoy  05/27/2014, 9:51 AM   Agree with Ms. Vanita Ingles assessment and plan. Gatha Mayer, MD, [facility]

## 2014-05-27 NOTE — Discharge Instructions (Signed)

## 2014-05-27 NOTE — Discharge Summary (Addendum)
Physician Discharge Summary  Audrey Spencer VZD:[id number] DOB: 1922-09-11 DOA: 05/24/2014  PCP: [name], MD  Admit date: 05/24/2014 Discharge date: 05/27/2014  Recommendations for Outpatient Follow-up:  1. Continue to hold coumadin due to risk of bleed. Will need to follow up with PCP, have CBC rechecked and if stable they may decide if it is reasonable to restart it. 2. Take amoxicillin 500 mg PO TID for 4 days on discharge for UTI.   Discharge Diagnoses:  Principal Problem:   Acute gastrointestinal hemorrhage Active Problems:   UTI (urinary tract infection)   Acute respiratory failure with hypoxia   Bleeding on Coumadin   GERD   CLL (chronic lymphocytic leukemia)   Stage I breast cancer   Thrombocytopenia   Leukocytosis   Malnutrition of moderate degree   History of DVT (deep vein thrombosis)   Urinary tract infectious disease    Discharge Condition: stable   Diet recommendation: as tolerated   History of present illness:   80 year old female with history of dementia, DVT on anticoagulation with coumadin, NHL (diagnosed in July 1993) initially treated with single agent cladribine achieving a durable remission lasting over 10 years. Disease transformed to chronic lymphocytic leukemia in 2010. She was treated with Bendamustine/Rituxan for 5 cycles between 08/18/2008 and 12/10/2008 achieving a complete hematologic and radiographic response. Patient was also diagnosed with a primary stage I ER positive cancer of the left breast in April 1998 treated with lumpectomy, radiation and tamoxifen. Mammogram in 08/06/2012 showed no evidence of malignancy. Today she presented to [facility] ED from SNF with sudden onset bright red blood per rectum for past 24 hours prior to this admission. In ED, BP was 108/91, HR 86, RR 18-25, T max 98 F and oxygen saturation of 78% on room air. It has improved to 98% with [place] oxygen support. Her blood work revealed hemoglobin of 5.5, platelets of 92, WBC  count 26.1, INR 2.25. She was given vitamin K total of 10 mg IV, FFP and 2 units of PRBC. CCM and GI have seen the patient in consultation. Additionally,she was found to have UTI and was started on zosyn on admission.    Assessment/Plan:     Principal Problem:  Acute gastrointestinal hemorrhage / Anemia of chronic disease / Bleeding on Coumadin  Bleeding likely diverticular. Appreciate GI following. Continue conservative management per their recommendations.   Patient has received FFP x once, vitamin K 10 mg IV, so far 2 units PRBC transfused.  Hemoglobin is stable at 7.1 --> 7.6.   Continue Protonix 40 mg PO Q 12 hours.   Active Problems: UTI (urinary tract infection) secondary to proteus mirabilis / Leukocytosis  Large leukocytes seen on UA. Urine culture is growing proteus mirabilis species.   Continue amoxicillin on discharge for 4 days. So far has received zosyn since admission.    Acute respiratory failure with hypoxia  Hypoxia may be from acute blood loss anemia. Stable cardiomegaly seen on CXR. BNP on admission mildly elevated at 386.   Respiratory status stable.   GERD  Continue protonix 40 gm PO Q 12 hours   CLL (chronic lymphocytic leukemia) / Stage I breast cancer  Dr. Alvy Bimler is patient's oncologist.  Thrombocytopenia  Likely due to malignancy and GI bleed, coumadin   Platelets trending up, 76 --> 88   Malnutrition of moderate degree  Nutrition consulted   History of DVT (deep vein thrombosis)  Coumadin continues to be on hold.  No DVT on LE doppler this admission.  DVT prophylaxis:   SCD's bilaterally due to risk of bleeding  Code Status: DNR/DNI Family Communication: Family not at the bedside    IV access:  Peripheral IV  Procedures and diagnostic studies:   Dg Chest 2 View 05/24/2014 Subtle blunting of the left costophrenic angle slightly worse and may be due to a small amount of pleural fluid. Stable  cardiomegaly.   Ct Abdomen Pelvis W Contrast 05/24/2014 Abnormal retroperitoneal and mesenteric adenopathy has improved supporting benign etiology. Right pleural effusion and basilar atelectasis. Chronic changes.   Medical Consultants:  [facility], Dr. Erskine Emery  Other Consultants:  None   IAnti-Infectives:   Zosyn 05/24/2014 --> 05/27/2014 Amoxicillin for 4 more days on discharge.    SignedLeisa Lenz, MD  [facility] 05/27/2014, 11:20 AM  Pager #: [phone]   Discharge Exam: Filed Vitals:   05/27/14 0730  BP: 144/58  Pulse: 78  Temp: 97.7 F (36.5 C)  Resp: 18   Filed Vitals:   05/26/14 1402 05/26/14 2300 05/27/14 0500 05/27/14 0730  BP: 134/49 110/91  144/58  Pulse: 81 78  78  Temp: 97.4 F (36.3 C) 97.8 F (36.6 C)  97.7 F (36.5 C)  TempSrc: Oral Oral  Oral  Resp: 18 18  18   Height:      Weight:   74.4 kg (164 lb 0.4 oz)   SpO2: 95% 96%  99%    General: Pt is not in acute distress Cardiovascular: Regular rate and rhythm, S1/S2 appreciated  Respiratory: no wheezing, no crackles, no rhonchi Abdominal: non distended, bowel sounds +, no guarding Extremities: trace LE pitting edema, no cyanosis, pulses palpable bilaterally DP and PT Neuro: Grossly nonfocal  Discharge Instructions  Discharge Instructions    Call MD for:  difficulty breathing, headache or visual disturbances    Complete by:  As directed      Call MD for:  persistant dizziness or light-headedness    Complete by:  As directed      Call MD for:  persistant nausea and vomiting    Complete by:  As directed      Call MD for:  severe uncontrolled pain    Complete by:  As directed      Diet - low sodium heart healthy    Complete by:  As directed      Discharge instructions    Complete by:  As directed   1. Continue to hold coumadin due to risk of bleed. Will need to follow up with PCP, have CBC rechecked and if stable they may decide if it is  reasonable to restart it.     Increase activity slowly    Complete by:  As directed             Medication List    STOP taking these medications        aspirin 325 MG tablet     enoxaparin 40 MG/0.4ML injection  Commonly known as:  LOVENOX     hydrocortisone 2.5 % rectal cream  Commonly known as:  ANUSOL-HC     levofloxacin 750 MG tablet  Commonly known as:  LEVAQUIN     omeprazole 20 MG capsule  Commonly known as:  PRILOSEC     warfarin 6 MG tablet  Commonly known as:  COUMADIN      TAKE these medications        acetaminophen 325 MG tablet  Commonly known as:  TYLENOL  Take 2 tablets (650 mg total) by  mouth 3 (three) times daily.     AZOPT 1 % ophthalmic suspension  Generic drug:  brinzolamide  Place 1 drop into the left eye Twice daily.     benzonatate 200 MG capsule  Commonly known as:  TESSALON  Take 1 capsule (200 mg total) by mouth 2 (two) times daily.     bisacodyl 10 MG suppository  Commonly known as:  DULCOLAX  Place 1 suppository (10 mg total) rectally daily as needed for moderate constipation.     CALCIUM 600+D 600-400 MG-UNIT per tablet  Generic drug:  Calcium Carbonate-Vitamin D  Take 1 tablet by mouth daily.     citalopram 10 MG tablet  Commonly known as:  CELEXA  Take 10 mg by mouth daily.     COMBIGAN 0.2-0.5 % ophthalmic solution  Generic drug:  brimonidine-timolol  Place 1 drop into both eyes 2 (two) times daily.     donepezil 5 MG tablet  Commonly known as:  ARICEPT  Take 1 tablet (5 mg total) by mouth at bedtime.     dorzolamide-timolol 22.3-6.8 MG/ML ophthalmic solution  Commonly known as:  COSOPT  Place 1 drop into both eyes 2 (two) times daily.     feeding supplement (ENSURE COMPLETE) Liqd  Take 237 mLs by mouth 2 (two) times daily between meals.     feeding supplement (PRO-STAT SUGAR FREE 64) Liqd  Take 30 mLs by mouth 2 (two) times daily.     furosemide 40 MG tablet  Commonly known as:  LASIX  Take 40 mg by mouth.      ipratropium-albuterol 0.5-2.5 (3) MG/3ML Soln  Commonly known as:  DUONEB  Take 3 mLs by nebulization every 4 (four) hours as needed.     latanoprost 0.005 % ophthalmic solution  Commonly known as:  XALATAN  Place into the left eye at bedtime.     losartan 50 MG tablet  Commonly known as:  COZAAR  Take 50 mg by mouth daily.     memantine 5 MG tablet  Commonly known as:  NAMENDA  Take 1 tablet (5 mg total) by mouth 2 (two) times daily.     metoCLOPramide 5 MG tablet  Commonly known as:  REGLAN  Take 5 mg by mouth 2 (two) times daily.     multivitamin with minerals Tabs tablet  Take 1 tablet by mouth daily.     ondansetron 4 MG tablet  Commonly known as:  ZOFRAN  Take 1 tablet (4 mg total) by mouth every 6 (six) hours as needed for nausea.     opium-belladonna 16.2-60 MG suppository  Commonly known as:  B&O SUPPRETTES  Place 1 suppository rectally every 6 (six) hours as needed for bladder spasms.     pantoprazole 40 MG tablet  Commonly known as:  PROTONIX  Take 1 tablet (40 mg total) by mouth daily.     polyethylene glycol packet  Commonly known as:  MIRALAX / GLYCOLAX  Take 17 g by mouth daily.     simethicone 40 MG/0.6ML drops  Commonly known as:  MYLICON  Take 1.2 mLs (80 mg total) by mouth 3 (three) times daily.           Follow-up Information    Follow up with [name], MD. Schedule an appointment as soon as possible for a visit in 2 weeks.   Specialties:  Internal Medicine, Radiology   Why:  Follow up appt after recent hospitalization   Contact information:   [address]  [place] [address] [phone]        The results of significant diagnostics from this hospitalization (including imaging, microbiology, ancillary and laboratory) are listed below for reference.    Significant Diagnostic Studies: Dg Chest 2 View  05/24/2014   CLINICAL DATA:  Rectal bleeding.  EXAM: CHEST  2 VIEW  COMPARISON:  04/21/2014 and 07/31/2013  FINDINGS:  Lungs are adequately inflated without focal consolidation or effusion. There is minimal blunting of the left costophrenic angle which may be slightly worse as cannot exclude a small amount left pleural fluid there is mild stable cardiomegaly. There is calcified plaque over the aortic arch. Two previous kyphoplasties over the lower thoracic and upper lumbar spine unchanged.  IMPRESSION: Subtle blunting of the left costophrenic angle slightly worse and may be due to a small amount of pleural fluid.  Stable cardiomegaly.   Electronically Signed   By: Marin Olp M.D.   On: 05/24/2014 07:25   Ct Abdomen Pelvis W Contrast  05/24/2014   CLINICAL DATA:  Pale complexion.  Rectal bleeding.  EXAM: CT ABDOMEN AND PELVIS WITH CONTRAST  TECHNIQUE: Multidetector CT imaging of the abdomen and pelvis was performed using the standard protocol following bolus administration of intravenous contrast.  CONTRAST:  31mL OMNIPAQUE IOHEXOL 300 MG/ML SOLN, 130mL OMNIPAQUE IOHEXOL 300 MG/ML SOLN  COMPARISON:  04/16/2014  FINDINGS: Small right pleural effusion. Dependent atelectasis at both lung bases.  Stable scattered liver cysts.  Left renal cysts is stable.  Stable renal hypodensities.  Spleen is within normal limits. Pancreas is within normal limits. Unremarkable gallbladder.  The cecum is in the right upper quadrant anterior to the liver. Terminal ileum and appendix are unremarkable.  Extensive sigmoid diverticulosis. Prominent stool and gas burden in the rectum.  Abnormal mesenteric and retroperitoneal adenopathy has improved. Index left para-aortic node on image 39 is 10 mm and was previously 12 mm by my direct measurements. Inguinal lymph nodes are also smaller.  Bladder is decompressed. Uterus is absent. Adnexa are unremarkable.  There is gas in the subcutaneous fat of the left flank which may be related to an injection.  Small hiatal hernia.  Stable lumbar spine.  IMPRESSION: Abnormal retroperitoneal and mesenteric adenopathy  has improved supporting benign etiology.  Right pleural effusion and basilar atelectasis.  Chronic changes.   Electronically Signed   By: Maryclare Bean M.D.   On: 05/24/2014 08:55    Microbiology: Recent Results (from the past 240 hour(s))  Urine culture     Status: None   Collection Time: 05/24/14  6:28 AM  Result Value Ref Range Status   Specimen Description URINE, RANDOM  Final   Special Requests NONE  Final   Colony Count   Final    >=100,000 COLONIES/ML Performed at [facility] Performed at [facility]    Report Status 05/26/2014 FINAL  Final   Organism ID, Bacteria PROTEUS MIRABILIS  Final      Susceptibility   Proteus mirabilis - MIC*    AMPICILLIN <=2 SENSITIVE Sensitive     CEFAZOLIN <=4 SENSITIVE Sensitive     CEFTRIAXONE <=1 SENSITIVE Sensitive     CIPROFLOXACIN >=4 RESISTANT Resistant     GENTAMICIN <=1 SENSITIVE Sensitive     LEVOFLOXACIN >=8 RESISTANT Resistant     NITROFURANTOIN 128 RESISTANT Resistant     TOBRAMYCIN <=1 SENSITIVE Sensitive     TRIMETH/SULFA <=20 SENSITIVE Sensitive  PIP/TAZO <=4 SENSITIVE Sensitive     * PROTEUS MIRABILIS  MRSA PCR Screening     Status: None   Collection Time: 05/24/14 10:01 AM  Result Value Ref Range Status   MRSA by PCR NEGATIVE NEGATIVE Final     Labs: Basic Metabolic Panel:  Recent Labs Lab 05/24/14 0610 05/24/14 0624 05/24/14 1707 05/25/14 0350 05/26/14 0508 05/27/14 0720  NA 139 141 140 142 141 143  K 3.9 4.0 4.3 4.3 3.8 3.8  CL 97 94* 102 106 105 103  CO2 39*  --  33* 31 32 35*  GLUCOSE 134* 130* 129* 128* 140* 121*  BUN 30* 27* 22 19 15 14   CREATININE 0.46* 0.70 0.45* 0.52 0.49* 0.53  CALCIUM 8.1*  --  7.8* 7.8* 8.1* 8.3*  MG  --   --  2.2  --   --   --   PHOS  --   --  3.4  --   --   --    Liver Function Tests:  Recent Labs Lab 05/24/14 0610 05/24/14 1707 05/25/14 0350  AST 31 31 33  ALT 27 24 28   ALKPHOS 48 48 44  BILITOT 0.5 0.6  0.4  PROT 5.3* 5.3* 5.1*  ALBUMIN 3.2* 3.3* 3.2*    Recent Labs Lab 05/24/14 0610  LIPASE 27   No results for input(s): AMMONIA in the last 168 hours. CBC:  Recent Labs Lab 05/24/14 0610 05/24/14 0624 05/24/14 1707 05/25/14 0350 05/26/14 0508 05/27/14 0720  WBC 26.1*  --  25.0* 25.5* 27.8* 29.4*  NEUTROABS 3.4  --  3.3  --   --   --   HGB 5.5* 6.8* 8.0* 7.6* 7.1* 7.6*  HCT 18.8* 20.0* 25.6* 24.9* 23.5* 25.8*  MCV 99.5  --  92.1 92.2 94.0 95.9  PLT 92*  --  77* 76* 81* 88*   Cardiac Enzymes: No results for input(s): CKTOTAL, CKMB, CKMBINDEX, TROPONINI in the last 168 hours. BNP: BNP (last 3 results) No results for input(s): PROBNP in the last 8760 hours. CBG:  Recent Labs Lab 05/24/14 0713 05/25/14 0731 05/26/14 0721 05/27/14 0727  GLUCAP 113* 116* 141* 117*    Time coordinating discharge: Over 30 minutes

## 2014-05-27 NOTE — Progress Notes (Signed)
Patient is set to discharge to [place] today. CSW left message for patient's Wyline Mood (ph#: [phone]). Discharge packet given to RN, Robert Bellow. [facility] called for transport.     Raynaldo Opitz, [facility] Clinical Social Worker cell #: [phone]

## 2014-05-29 ENCOUNTER — Telehealth: Payer: Self-pay | Admitting: *Deleted

## 2014-05-29 NOTE — Telephone Encounter (Signed)
Transition Care Management Follow-up Telephone Call D/C 05/27/14  How have you been since you were released from the hospital? Called pt to get her set up for TCM appt. Called spoke with niece Sheppard Evens) she stated that pt was sent to [place] home after discharge.  Do you understand why you were in the hospital?   Do you understand the discharge instrcutions?   Items Reviewed:  Medications reviewed:Marland Kitchen  Allergies reviewed:   Dietary changes reviewed:   Referrals reviewed:    Functional Questionnaire:   Activities of Daily Living (ADLs):   She states they are independent in the following:  States they require assistance with the following:    Any transportation issues/concerns?:    Any patient concerns?    Confirmed importance and date/time of follow-up visits scheduled:    Confirmed with patient if condition begins to worsen call PCP or go to the ER.  Patient was given the Call-a-Nurse line [phone]:

## 2014-05-30 DIAGNOSIS — Z66 Do not resuscitate: Secondary | ICD-10-CM | POA: Diagnosis not present

## 2014-05-30 DIAGNOSIS — Z853 Personal history of malignant neoplasm of breast: Secondary | ICD-10-CM | POA: Diagnosis not present

## 2014-05-30 DIAGNOSIS — R590 Localized enlarged lymph nodes: Secondary | ICD-10-CM | POA: Diagnosis not present

## 2014-05-30 DIAGNOSIS — N390 Urinary tract infection, site not specified: Secondary | ICD-10-CM | POA: Diagnosis not present

## 2014-05-30 DIAGNOSIS — Z90710 Acquired absence of both cervix and uterus: Secondary | ICD-10-CM | POA: Diagnosis not present

## 2014-05-30 DIAGNOSIS — Z888 Allergy status to other drugs, medicaments and biological substances status: Secondary | ICD-10-CM | POA: Diagnosis not present

## 2014-05-30 DIAGNOSIS — G92 Toxic encephalopathy: Secondary | ICD-10-CM | POA: Diagnosis not present

## 2014-05-30 DIAGNOSIS — D72829 Elevated white blood cell count, unspecified: Secondary | ICD-10-CM | POA: Diagnosis not present

## 2014-05-30 DIAGNOSIS — E440 Moderate protein-calorie malnutrition: Secondary | ICD-10-CM | POA: Diagnosis not present

## 2014-05-30 DIAGNOSIS — G4733 Obstructive sleep apnea (adult) (pediatric): Secondary | ICD-10-CM | POA: Diagnosis not present

## 2014-05-30 DIAGNOSIS — C9110 Chronic lymphocytic leukemia of B-cell type not having achieved remission: Secondary | ICD-10-CM | POA: Diagnosis not present

## 2014-05-30 DIAGNOSIS — T45515A Adverse effect of anticoagulants, initial encounter: Secondary | ICD-10-CM | POA: Diagnosis not present

## 2014-05-30 DIAGNOSIS — M810 Age-related osteoporosis without current pathological fracture: Secondary | ICD-10-CM | POA: Diagnosis not present

## 2014-05-30 DIAGNOSIS — D649 Anemia, unspecified: Secondary | ICD-10-CM | POA: Diagnosis not present

## 2014-05-30 DIAGNOSIS — Z881 Allergy status to other antibiotic agents status: Secondary | ICD-10-CM | POA: Diagnosis not present

## 2014-05-30 DIAGNOSIS — K625 Hemorrhage of anus and rectum: Secondary | ICD-10-CM | POA: Diagnosis not present

## 2014-05-30 DIAGNOSIS — N399 Disorder of urinary system, unspecified: Secondary | ICD-10-CM | POA: Diagnosis not present

## 2014-05-30 DIAGNOSIS — F0390 Unspecified dementia without behavioral disturbance: Secondary | ICD-10-CM | POA: Diagnosis not present

## 2014-05-30 DIAGNOSIS — J90 Pleural effusion, not elsewhere classified: Secondary | ICD-10-CM | POA: Diagnosis not present

## 2014-05-30 DIAGNOSIS — I517 Cardiomegaly: Secondary | ICD-10-CM | POA: Diagnosis not present

## 2014-05-30 DIAGNOSIS — E785 Hyperlipidemia, unspecified: Secondary | ICD-10-CM | POA: Diagnosis not present

## 2014-05-30 DIAGNOSIS — Z87440 Personal history of urinary (tract) infections: Secondary | ICD-10-CM | POA: Diagnosis not present

## 2014-05-30 DIAGNOSIS — Z515 Encounter for palliative care: Secondary | ICD-10-CM | POA: Diagnosis not present

## 2014-05-30 DIAGNOSIS — K922 Gastrointestinal hemorrhage, unspecified: Secondary | ICD-10-CM | POA: Diagnosis not present

## 2014-05-30 DIAGNOSIS — R627 Adult failure to thrive: Secondary | ICD-10-CM | POA: Diagnosis not present

## 2014-05-30 DIAGNOSIS — K219 Gastro-esophageal reflux disease without esophagitis: Secondary | ICD-10-CM | POA: Diagnosis not present

## 2014-05-30 DIAGNOSIS — D696 Thrombocytopenia, unspecified: Secondary | ICD-10-CM | POA: Diagnosis not present

## 2014-05-30 DIAGNOSIS — Z86718 Personal history of other venous thrombosis and embolism: Secondary | ICD-10-CM | POA: Diagnosis not present

## 2014-05-30 DIAGNOSIS — J9601 Acute respiratory failure with hypoxia: Secondary | ICD-10-CM | POA: Diagnosis not present

## 2014-05-30 DIAGNOSIS — H409 Unspecified glaucoma: Secondary | ICD-10-CM | POA: Diagnosis not present

## 2014-05-30 DIAGNOSIS — R0902 Hypoxemia: Secondary | ICD-10-CM | POA: Diagnosis not present

## 2014-05-30 DIAGNOSIS — N318 Other neuromuscular dysfunction of bladder: Secondary | ICD-10-CM | POA: Diagnosis not present

## 2014-05-30 DIAGNOSIS — R0600 Dyspnea, unspecified: Secondary | ICD-10-CM | POA: Diagnosis not present

## 2014-06-01 DIAGNOSIS — E440 Moderate protein-calorie malnutrition: Secondary | ICD-10-CM | POA: Diagnosis not present

## 2014-06-01 DIAGNOSIS — C9110 Chronic lymphocytic leukemia of B-cell type not having achieved remission: Secondary | ICD-10-CM | POA: Diagnosis not present

## 2014-06-01 DIAGNOSIS — D649 Anemia, unspecified: Secondary | ICD-10-CM | POA: Diagnosis not present

## 2014-06-01 DIAGNOSIS — N390 Urinary tract infection, site not specified: Secondary | ICD-10-CM | POA: Diagnosis not present

## 2014-06-01 DIAGNOSIS — R627 Adult failure to thrive: Secondary | ICD-10-CM | POA: Diagnosis not present

## 2014-06-08 ENCOUNTER — Inpatient Hospital Stay (HOSPITAL_COMMUNITY)
Admission: EM | Admit: 2014-06-08 | Discharge: 2014-06-09 | DRG: 189 | Disposition: A | Discharge status: Skilled Nursing Facility | Payer: Medicare Other | Attending: Family Medicine | Admitting: Family Medicine

## 2014-06-08 ENCOUNTER — Emergency Department (HOSPITAL_COMMUNITY): Payer: Medicare Other

## 2014-06-08 ENCOUNTER — Encounter (HOSPITAL_COMMUNITY): Payer: Self-pay | Admitting: Nurse Practitioner

## 2014-06-08 DIAGNOSIS — J9601 Acute respiratory failure with hypoxia: Principal | ICD-10-CM | POA: Diagnosis present

## 2014-06-08 DIAGNOSIS — R0600 Dyspnea, unspecified: Secondary | ICD-10-CM

## 2014-06-08 DIAGNOSIS — R590 Localized enlarged lymph nodes: Secondary | ICD-10-CM | POA: Diagnosis not present

## 2014-06-08 DIAGNOSIS — Z515 Encounter for palliative care: Secondary | ICD-10-CM | POA: Diagnosis not present

## 2014-06-08 DIAGNOSIS — R0902 Hypoxemia: Secondary | ICD-10-CM | POA: Diagnosis present

## 2014-06-08 DIAGNOSIS — G4733 Obstructive sleep apnea (adult) (pediatric): Secondary | ICD-10-CM | POA: Diagnosis not present

## 2014-06-08 DIAGNOSIS — Z90710 Acquired absence of both cervix and uterus: Secondary | ICD-10-CM | POA: Diagnosis not present

## 2014-06-08 DIAGNOSIS — M810 Age-related osteoporosis without current pathological fracture: Secondary | ICD-10-CM | POA: Diagnosis present

## 2014-06-08 DIAGNOSIS — F0390 Unspecified dementia without behavioral disturbance: Secondary | ICD-10-CM | POA: Diagnosis present

## 2014-06-08 DIAGNOSIS — G92 Toxic encephalopathy: Secondary | ICD-10-CM | POA: Diagnosis present

## 2014-06-08 DIAGNOSIS — Z881 Allergy status to other antibiotic agents status: Secondary | ICD-10-CM | POA: Diagnosis not present

## 2014-06-08 DIAGNOSIS — Z87440 Personal history of urinary (tract) infections: Secondary | ICD-10-CM | POA: Diagnosis not present

## 2014-06-08 DIAGNOSIS — T45515A Adverse effect of anticoagulants, initial encounter: Secondary | ICD-10-CM | POA: Diagnosis not present

## 2014-06-08 DIAGNOSIS — H409 Unspecified glaucoma: Secondary | ICD-10-CM | POA: Diagnosis not present

## 2014-06-08 DIAGNOSIS — Z888 Allergy status to other drugs, medicaments and biological substances status: Secondary | ICD-10-CM | POA: Diagnosis not present

## 2014-06-08 DIAGNOSIS — C9110 Chronic lymphocytic leukemia of B-cell type not having achieved remission: Secondary | ICD-10-CM | POA: Diagnosis not present

## 2014-06-08 DIAGNOSIS — Z86718 Personal history of other venous thrombosis and embolism: Secondary | ICD-10-CM

## 2014-06-08 DIAGNOSIS — Z853 Personal history of malignant neoplasm of breast: Secondary | ICD-10-CM | POA: Diagnosis not present

## 2014-06-08 DIAGNOSIS — K219 Gastro-esophageal reflux disease without esophagitis: Secondary | ICD-10-CM | POA: Diagnosis not present

## 2014-06-08 DIAGNOSIS — C50919 Malignant neoplasm of unspecified site of unspecified female breast: Secondary | ICD-10-CM | POA: Diagnosis present

## 2014-06-08 DIAGNOSIS — I517 Cardiomegaly: Secondary | ICD-10-CM | POA: Diagnosis not present

## 2014-06-08 DIAGNOSIS — Z66 Do not resuscitate: Secondary | ICD-10-CM | POA: Diagnosis present

## 2014-06-08 DIAGNOSIS — K625 Hemorrhage of anus and rectum: Secondary | ICD-10-CM | POA: Diagnosis not present

## 2014-06-08 DIAGNOSIS — N318 Other neuromuscular dysfunction of bladder: Secondary | ICD-10-CM | POA: Diagnosis present

## 2014-06-08 DIAGNOSIS — E785 Hyperlipidemia, unspecified: Secondary | ICD-10-CM | POA: Diagnosis not present

## 2014-06-08 DIAGNOSIS — J90 Pleural effusion, not elsewhere classified: Secondary | ICD-10-CM | POA: Diagnosis not present

## 2014-06-08 DIAGNOSIS — R58 Hemorrhage, not elsewhere classified: Secondary | ICD-10-CM

## 2014-06-08 LAB — CBC WITH DIFFERENTIAL/PLATELET
BASOS PCT: 0 % (ref 0–1)
Basophils Absolute: 0 10*3/uL (ref 0.0–0.1)
Eosinophils Absolute: 0 10*3/uL (ref 0.0–0.7)
Eosinophils Relative: 0 % (ref 0–5)
HEMATOCRIT: 26.9 % — AB (ref 36.0–46.0)
Hemoglobin: 7.5 g/dL — ABNORMAL LOW (ref 12.0–15.0)
LYMPHS PCT: 89 % — AB (ref 12–46)
Lymphs Abs: 39 10*3/uL — ABNORMAL HIGH (ref 0.7–4.0)
MCH: 28.1 pg (ref 26.0–34.0)
MCHC: 27.9 g/dL — AB (ref 30.0–36.0)
MCV: 100.7 fL — ABNORMAL HIGH (ref 78.0–100.0)
MONOS PCT: 2 % — AB (ref 3–12)
Monocytes Absolute: 0.9 10*3/uL (ref 0.1–1.0)
Neutro Abs: 3.9 10*3/uL (ref 1.7–7.7)
Neutrophils Relative %: 9 % — ABNORMAL LOW (ref 43–77)
PLATELETS: 97 10*3/uL — AB (ref 150–400)
RBC: 2.67 MIL/uL — AB (ref 3.87–5.11)
RDW: 18.7 % — ABNORMAL HIGH (ref 11.5–15.5)
WBC: 43.8 10*3/uL — ABNORMAL HIGH (ref 4.0–10.5)

## 2014-06-08 LAB — COMPREHENSIVE METABOLIC PANEL
ALBUMIN: 3.7 g/dL (ref 3.5–5.2)
ALK PHOS: 55 U/L (ref 39–117)
ALT: 19 U/L (ref 0–35)
ANION GAP: 2 — AB (ref 5–15)
AST: 27 U/L (ref 0–37)
BUN: 24 mg/dL — ABNORMAL HIGH (ref 6–23)
CALCIUM: 8.5 mg/dL (ref 8.4–10.5)
CO2: 46 mmol/L (ref 19–32)
CREATININE: 0.55 mg/dL (ref 0.50–1.10)
Chloride: 91 mEq/L — ABNORMAL LOW (ref 96–112)
GFR calc non Af Amer: 81 mL/min — ABNORMAL LOW (ref >90)
GLUCOSE: 137 mg/dL — AB (ref 70–99)
Potassium: 3.6 mmol/L (ref 3.5–5.1)
Sodium: 139 mmol/L (ref 135–145)
TOTAL PROTEIN: 6 g/dL (ref 6.0–8.3)
Total Bilirubin: 0.7 mg/dL (ref 0.3–1.2)

## 2014-06-08 LAB — BRAIN NATRIURETIC PEPTIDE: B Natriuretic Peptide: 925.8 pg/mL — ABNORMAL HIGH (ref 0.0–100.0)

## 2014-06-08 LAB — PROTIME-INR
INR: 0.97 (ref 0.00–1.49)
Prothrombin Time: 13 seconds (ref 11.6–15.2)

## 2014-06-08 LAB — POC OCCULT BLOOD, ED: Fecal Occult Bld: NEGATIVE

## 2014-06-08 LAB — TYPE AND SCREEN
ABO/RH(D): A POS
ANTIBODY SCREEN: NEGATIVE

## 2014-06-08 LAB — TROPONIN I: Troponin I: 0.04 ng/mL — ABNORMAL HIGH (ref <0.031)

## 2014-06-08 NOTE — ED Provider Notes (Signed)
CSN: [id number]     Arrival date & time 06/08/14  2120 History   First MD Initiated Contact with Patient 06/08/14 2133     Chief Complaint  Patient presents with  . Shortness of Breath  . Low hemoglobin      (Consider location/radiation/quality/duration/timing/severity/associated sxs/prior Treatment) HPI  81 year old female presents from nursing facility with decreased oxygen saturations and decreased hemoglobin. The patient has a history of dementia and the history is taken from the son and off. He reports that they told him she had O2 sats of 70s and had to be put on a nonrebreather. She was tried be put back on a nasal cannula but desatted again. Her hemoglobin is down trending from discharge last week. She had a GI bleed at that time while on Coumadin and was taken off. The patient has not had any current complaints. No known chest pain or abdominal pain. No one has noticed blood in her stool. Patient is typically on 2 L nasal cannula at home.  Past Medical History  Diagnosis Date  . Dementia 01/25/2011  . CLL 09/22/2009  . Personal history of malignant neoplasm of breast 09/22/2009  . ANEMIA-IRON DEFICIENCY 04/09/2007  . Bronchiectasis without acute exacerbation 01/07/2009  . GERD 02/26/2008  . HYPERLIPIDEMIA 04/09/2007  . OBSTRUCTIVE SLEEP APNEA 04/22/2008  . Pancytopenia 03/19/2009  . Recurrent UTI 01/25/2011  . DIVERTICULOSIS, COLON 04/15/2010  . GLAUCOMA 04/09/2007  . 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 OSTEOPOROSIS 03/19/2009  . RECTOCELE WITHOUT MENTION OF UTERINE PROLAPSE 04/15/2010  . Depression 05/03/2012  . Stage I breast cancer 06/29/2012    Left breast stage 1 dx 08/1996;  ER positive Rx lumpectomy/RT/Tamoxifen   Past Surgical History  Procedure Laterality Date  . Breast surgery      lumpectomy  . Abdominal hysterectomy    . Rotater cuff     Family History  Problem Relation Age of Onset  . Hypertension Father   . Cancer Sister   . Cancer Brother    History   Substance Use Topics  . Smoking status: Never Smoker   . Smokeless tobacco: Never Used  . Alcohol Use: No   OB History    No data available     Review of Systems  Unable to perform ROS: Dementia      Allergies  Pseudoephedrine; Codeine; Doxycycline; Nsaids; and Solifenacin succinate  Home Medications   Prior to Admission medications   Medication Sig Start Date End Date Taking? Authorizing Provider  acetaminophen (TYLENOL) 325 MG tablet Take 2 tablets (650 mg total) by mouth 3 (three) times daily. 04/22/14   Venetia Maxon Rama, MD  Amino Acids-Protein Hydrolys (FEEDING SUPPLEMENT, PRO-STAT SUGAR FREE 64,) LIQD Take 30 mLs by mouth 2 (two) times daily.    Historical Provider, MD  amoxicillin (AMOXIL) 500 MG capsule Take 1 capsule (500 mg total) by mouth 3 (three) times daily. 05/27/14   Robbie Lis, MD  AZOPT 1 % ophthalmic suspension Place 1 drop into the left eye Twice daily.  05/10/12   Historical Provider, MD  benzonatate (TESSALON) 200 MG capsule Take 1 capsule (200 mg total) by mouth 2 (two) times daily. 04/22/14   Venetia Maxon Rama, MD  bisacodyl (DULCOLAX) 10 MG suppository Place 1 suppository (10 mg total) rectally daily as needed for moderate constipation. 04/22/14   Venetia Maxon Rama, MD  Calcium Carbonate-Vitamin D (CALCIUM 600+D) 600-400 MG-UNIT per tablet Take 1 tablet by mouth daily.    Historical Provider, MD  citalopram (CELEXA)  10 MG tablet Take 10 mg by mouth daily.    Historical Provider, MD  COMBIGAN 0.2-0.5 % ophthalmic solution Place 1 drop into both eyes 2 (two) times daily.  06/06/13   Historical Provider, MD  donepezil (ARICEPT) 5 MG tablet Take 1 tablet (5 mg total) by mouth at bedtime. 10/11/13   Biagio Borg, MD  dorzolamide-timolol (COSOPT) 22.3-6.8 MG/ML ophthalmic solution Place 1 drop into both eyes 2 (two) times daily.    Historical Provider, MD  feeding supplement, ENSURE COMPLETE, (ENSURE COMPLETE) LIQD Take 237 mLs by mouth 2 (two) times daily between  meals. 04/22/14   Venetia Maxon Rama, MD  furosemide (LASIX) 40 MG tablet Take 40 mg by mouth.    Historical Provider, MD  ipratropium-albuterol (DUONEB) 0.5-2.5 (3) MG/3ML SOLN Take 3 mLs by nebulization every 4 (four) hours as needed. 04/22/14   Christina P Rama, MD  latanoprost (XALATAN) 0.005 % ophthalmic solution Place into the left eye at bedtime. 12/24/12   Historical Provider, MD  losartan (COZAAR) 50 MG tablet Take 50 mg by mouth daily.    Historical Provider, MD  memantine (NAMENDA) 5 MG tablet Take 1 tablet (5 mg total) by mouth 2 (two) times daily. 10/11/13   Biagio Borg, MD  metoCLOPramide (REGLAN) 5 MG tablet Take 5 mg by mouth 2 (two) times daily.    Historical Provider, MD  Multiple Vitamin (MULTIVITAMIN WITH MINERALS) TABS tablet Take 1 tablet by mouth daily.    Historical Provider, MD  ondansetron (ZOFRAN) 4 MG tablet Take 1 tablet (4 mg total) by mouth every 6 (six) hours as needed for nausea. 05/27/14   Robbie Lis, MD  opium-belladonna (B&O SUPPRETTES) 16.2-60 MG suppository Place 1 suppository rectally every 6 (six) hours as needed for bladder spasms. 04/22/14   Christina P Rama, MD  pantoprazole (PROTONIX) 40 MG tablet Take 1 tablet (40 mg total) by mouth daily. 05/27/14   Robbie Lis, MD  polyethylene glycol [facility] / Floria Raveling) packet Take 17 g by mouth daily. 04/22/14   Venetia Maxon Rama, MD  simethicone (MYLICON) 40 HG/9.9ME drops Take 1.2 mLs (80 mg total) by mouth 3 (three) times daily. 04/22/14   Christina P Rama, MD   BP 158/60 mmHg  Pulse 81  Temp(Src) 97.9 F (36.6 C)  Resp 28  SpO2 100% Physical Exam  Constitutional: She appears well-developed and well-nourished.  HENT:  Head: Normocephalic and atraumatic.  Right Ear: External ear normal.  Left Ear: External ear normal.  Nose: Nose normal.  Eyes: Right eye exhibits no discharge. Left eye exhibits no discharge.  Cardiovascular: Normal rate, regular rhythm and normal heart sounds.   Pulmonary/Chest: Effort  normal and breath sounds normal.  Abdominal: Soft. She exhibits no distension. There is no tenderness.  Neurological: She is alert. She is disoriented.  Skin: Skin is warm and dry. She is not diaphoretic.  Nursing note and vitals reviewed.   ED Course  Procedures (including critical care time) Labs Review Labs Reviewed  COMPREHENSIVE METABOLIC PANEL - Abnormal; Notable for the following:    Chloride 91 (*)    CO2 46 (*)    Glucose, Bld 137 (*)    BUN 24 (*)    GFR calc non Af Amer 81 (*)    Anion gap 2 (*)    All other components within normal limits  CBC WITH DIFFERENTIAL - Abnormal; Notable for the following:    WBC 43.8 (*)    RBC 2.67 (*)  Hemoglobin 7.5 (*)    HCT 26.9 (*)    MCV 100.7 (*)    MCHC 27.9 (*)    RDW 18.7 (*)    Platelets 97 (*)    Neutrophils Relative % 9 (*)    Lymphocytes Relative 89 (*)    Monocytes Relative 2 (*)    Lymphs Abs 39.0 (*)    All other components within normal limits  TROPONIN I - Abnormal; Notable for the following:    Troponin I 0.04 (*)    All other components within normal limits  BRAIN NATRIURETIC PEPTIDE - Abnormal; Notable for the following:    B Natriuretic Peptide 925.8 (*)    All other components within normal limits  PROTIME-INR  URINALYSIS, ROUTINE W REFLEX MICROSCOPIC  BLOOD GAS, ARTERIAL  POC OCCULT BLOOD, ED  TYPE AND SCREEN    Imaging Review Dg Chest Port 1 View  06/08/2014   CLINICAL DATA:  Acute onset of shortness of breath. Initial encounter.  EXAM: PORTABLE CHEST - 1 VIEW  COMPARISON:  Chest radiograph performed 05/24/2014  FINDINGS: The lungs are well-aerated. Small bilateral pleural effusions noted, left greater than right, with bibasilar opacities. This could reflect minimal interstitial edema. Pulmonary vascularity is at the upper limits of normal. There is no evidence of pneumothorax.  The cardiomediastinal silhouette is mildly enlarged. No acute osseous abnormalities are seen.  IMPRESSION: Small bilateral  pleural effusions, left greater than right, with bibasilar airspace opacities. This could reflect minimal interstitial edema. Mild cardiomegaly noted.   Electronically Signed   By: Garald Balding M.D.   On: 06/08/2014 22:19     EKG Interpretation   Date/Time:  Sunday June 08 2014 21:50:57 EST Ventricular Rate:  82 PR Interval:  126 QRS Duration: 93 QT Interval:  393 QTC Calculation: 459 R Axis:   86 Text Interpretation:  Ectopic atrial rhythm Ventricular premature complex  Borderline right axis deviation Low voltage, precordial leads Repol abnrm  suggests ischemia, diffuse leads no significant change since Dec 2015  Confirmed by Armie Moren  MD, Isaia Hassell ([id number]) on 06/08/2014 10:51:41 PM      MDM   Final diagnoses:  Dyspnea    Patient is more dyspneic than normal according to family at bedside. She's not been hypoxic here but was apparently hypoxic into the 70s at her living facility. She does have a history of pulmonary malaise him and his recently been taken off her Coumadin due to the GI bleeding. She does have effusion seen on her chest x-ray and an elevation in BNP compared to 2 weeks ago. She's not in respiratory distress and does not seem altered. She is now on her baseline O2 requirement. Discussed with the hospitalist, Dr. Shanon Brow, who at this point recommend holding off on CT angio and will evaluate and admit the patient.    Ephraim Hamburger, MD 06/09/14 0002

## 2014-06-08 NOTE — ED Notes (Signed)
Bed: WA08 Expected date:  Expected time:  Means of arrival:  Comments: EMS 

## 2014-06-08 NOTE — ED Notes (Signed)
EKG given to EDP,Goldston,.MD., for review. 

## 2014-06-08 NOTE — ED Notes (Signed)
Pt presents from SNF, [facility], bedside report of low O2 sats and low hemoglobin, pt usually on nasal canula 2L, today O2 sats were as low as 80% with her regular O2 supply. Currently on simple mask and maintaining at 100% on 3L, mild tachypnea, Aox3 which is her baseline. Pt NAD, mildly lethargic at this time.

## 2014-06-09 DIAGNOSIS — Z881 Allergy status to other antibiotic agents status: Secondary | ICD-10-CM | POA: Diagnosis not present

## 2014-06-09 DIAGNOSIS — R58 Hemorrhage, not elsewhere classified: Secondary | ICD-10-CM | POA: Diagnosis not present

## 2014-06-09 DIAGNOSIS — Z66 Do not resuscitate: Secondary | ICD-10-CM | POA: Diagnosis not present

## 2014-06-09 DIAGNOSIS — C9110 Chronic lymphocytic leukemia of B-cell type not having achieved remission: Secondary | ICD-10-CM

## 2014-06-09 DIAGNOSIS — Z90710 Acquired absence of both cervix and uterus: Secondary | ICD-10-CM | POA: Diagnosis not present

## 2014-06-09 DIAGNOSIS — G92 Toxic encephalopathy: Secondary | ICD-10-CM | POA: Diagnosis present

## 2014-06-09 DIAGNOSIS — Z515 Encounter for palliative care: Secondary | ICD-10-CM | POA: Diagnosis not present

## 2014-06-09 DIAGNOSIS — R0902 Hypoxemia: Secondary | ICD-10-CM | POA: Diagnosis present

## 2014-06-09 DIAGNOSIS — Z888 Allergy status to other drugs, medicaments and biological substances status: Secondary | ICD-10-CM | POA: Diagnosis not present

## 2014-06-09 DIAGNOSIS — Z87440 Personal history of urinary (tract) infections: Secondary | ICD-10-CM | POA: Diagnosis not present

## 2014-06-09 DIAGNOSIS — J9601 Acute respiratory failure with hypoxia: Secondary | ICD-10-CM | POA: Diagnosis not present

## 2014-06-09 DIAGNOSIS — G4733 Obstructive sleep apnea (adult) (pediatric): Secondary | ICD-10-CM | POA: Diagnosis present

## 2014-06-09 DIAGNOSIS — C50919 Malignant neoplasm of unspecified site of unspecified female breast: Secondary | ICD-10-CM | POA: Diagnosis not present

## 2014-06-09 DIAGNOSIS — R590 Localized enlarged lymph nodes: Secondary | ICD-10-CM | POA: Diagnosis present

## 2014-06-09 DIAGNOSIS — N318 Other neuromuscular dysfunction of bladder: Secondary | ICD-10-CM | POA: Diagnosis present

## 2014-06-09 DIAGNOSIS — K219 Gastro-esophageal reflux disease without esophagitis: Secondary | ICD-10-CM | POA: Diagnosis present

## 2014-06-09 DIAGNOSIS — E785 Hyperlipidemia, unspecified: Secondary | ICD-10-CM | POA: Diagnosis present

## 2014-06-09 DIAGNOSIS — D688 Other specified coagulation defects: Secondary | ICD-10-CM

## 2014-06-09 DIAGNOSIS — R0600 Dyspnea, unspecified: Secondary | ICD-10-CM | POA: Diagnosis not present

## 2014-06-09 DIAGNOSIS — J9600 Acute respiratory failure, unspecified whether with hypoxia or hypercapnia: Secondary | ICD-10-CM | POA: Diagnosis not present

## 2014-06-09 DIAGNOSIS — R0602 Shortness of breath: Secondary | ICD-10-CM | POA: Diagnosis not present

## 2014-06-09 DIAGNOSIS — H409 Unspecified glaucoma: Secondary | ICD-10-CM | POA: Diagnosis present

## 2014-06-09 DIAGNOSIS — Z86718 Personal history of other venous thrombosis and embolism: Secondary | ICD-10-CM

## 2014-06-09 DIAGNOSIS — F0390 Unspecified dementia without behavioral disturbance: Secondary | ICD-10-CM | POA: Diagnosis present

## 2014-06-09 DIAGNOSIS — M810 Age-related osteoporosis without current pathological fracture: Secondary | ICD-10-CM | POA: Diagnosis present

## 2014-06-09 DIAGNOSIS — T45515A Adverse effect of anticoagulants, initial encounter: Secondary | ICD-10-CM | POA: Diagnosis present

## 2014-06-09 DIAGNOSIS — Z853 Personal history of malignant neoplasm of breast: Secondary | ICD-10-CM | POA: Diagnosis not present

## 2014-06-09 DIAGNOSIS — Z7901 Long term (current) use of anticoagulants: Secondary | ICD-10-CM | POA: Diagnosis not present

## 2014-06-09 DIAGNOSIS — K625 Hemorrhage of anus and rectum: Secondary | ICD-10-CM | POA: Diagnosis present

## 2014-06-09 LAB — BLOOD GAS, ARTERIAL
Acid-Base Excess: 18.2 mmol/L — ABNORMAL HIGH (ref 0.0–2.0)
Bicarbonate: 46.2 mEq/L — ABNORMAL HIGH (ref 20.0–24.0)
DRAWN BY: 31814
O2 Saturation: 97.4 %
PCO2 ART: 89.8 mmHg — AB (ref 35.0–45.0)
PO2 ART: 105 mmHg — AB (ref 80.0–100.0)
Patient temperature: 97.9
TCO2: 44.9 mmol/L (ref 0–100)
pH, Arterial: 7.329 — ABNORMAL LOW (ref 7.350–7.450)

## 2014-06-09 LAB — URINE MICROSCOPIC-ADD ON

## 2014-06-09 LAB — URINALYSIS, ROUTINE W REFLEX MICROSCOPIC
Bilirubin Urine: NEGATIVE
Glucose, UA: NEGATIVE mg/dL
KETONES UR: NEGATIVE mg/dL
Nitrite: NEGATIVE
PROTEIN: 30 mg/dL — AB
Specific Gravity, Urine: 1.021 (ref 1.005–1.030)
Urobilinogen, UA: 0.2 mg/dL (ref 0.0–1.0)
pH: 5.5 (ref 5.0–8.0)

## 2014-06-09 LAB — TROPONIN I
TROPONIN I: 0.03 ng/mL (ref <0.031)
Troponin I: 0.04 ng/mL — ABNORMAL HIGH (ref <0.031)
Troponin I: 0.04 ng/mL — ABNORMAL HIGH (ref <0.031)

## 2014-06-09 MED ORDER — SODIUM CHLORIDE 0.9 % IJ SOLN: 3.0000 mL | INTRAMUSCULAR | Status: DC | PRN

## 2014-06-09 MED ORDER — LORAZEPAM 2 MG/ML PO CONC: 1.0000 mg | ORAL | Status: AC | PRN

## 2014-06-09 MED ORDER — SODIUM CHLORIDE 0.9 % IV SOLN
250.0000 mL | INTRAVENOUS | Status: DC | PRN
  Administered 2014-06-09: 250 mL via INTRAVENOUS

## 2014-06-09 MED ORDER — IPRATROPIUM-ALBUTEROL 0.5-2.5 (3) MG/3ML IN SOLN: 3.0000 mL | RESPIRATORY_TRACT | Status: DC | PRN

## 2014-06-09 MED ORDER — SODIUM CHLORIDE 0.9 % IJ SOLN
3.0000 mL | Freq: Two times a day (BID) | INTRAMUSCULAR | Status: DC
  Administered 2014-06-09: 3 mL via INTRAVENOUS

## 2014-06-09 MED ORDER — LEVOFLOXACIN IN D5W 750 MG/150ML IV SOLN
750.0000 mg | Freq: Every day | INTRAVENOUS | Status: DC
  Administered 2014-06-09: 750 mg via INTRAVENOUS
  Filled 2014-06-09 (×2): qty 150

## 2014-06-09 NOTE — Progress Notes (Signed)
ANTIBIOTIC CONSULT NOTE - INITIAL  Pharmacy Consult for levofloxacin Indication: Acute respiratory failure with hypoxia  Allergies  Allergen Reactions  . Pseudoephedrine Other (See Comments)    insomnia  . Codeine     REACTION: nausea (mild)  . Doxycycline     REACTION: Nausea  . Nsaids     REACTION: GI bleed/anemia  . Solifenacin Succinate     REACTION: blurred vision    Patient Measurements: Weight: 157 lb 10.1 oz (71.5 kg) Adjusted Body Weight:   Vital Signs: Temp: 98.3 F (36.8 C) (01/11 0514) Temp Source: Oral (01/11 0514) BP: 129/51 mmHg (01/11 0514) Pulse Rate: 83 (01/11 0514) Intake/Output from previous day:   Intake/Output from this shift:    Labs:  Recent Labs  06/08/14 2208  WBC 43.8*  HGB 7.5*  PLT 97*  CREATININE 0.55   Estimated Creatinine Clearance: 45.2 mL/min (by C-G formula based on Cr of 0.55). No results for input(s): VANCOTROUGH, VANCOPEAK, VANCORANDOM, GENTTROUGH, GENTPEAK, GENTRANDOM, TOBRATROUGH, TOBRAPEAK, TOBRARND, AMIKACINPEAK, AMIKACINTROU, AMIKACIN in the last 72 hours.   Microbiology: Recent Results (from the past 720 hour(s))  Urine culture     Status: None   Collection Time: 05/24/14  6:28 AM  Result Value Ref Range Status   Specimen Description URINE, RANDOM  Final   Special Requests NONE  Final   Colony Count   Final    >=100,000 COLONIES/ML Performed at [facility]    Culture   Final    PROTEUS MIRABILIS Performed at [facility]    Report Status 05/26/2014 FINAL  Final   Organism ID, Bacteria PROTEUS MIRABILIS  Final      Susceptibility   Proteus mirabilis - MIC*    AMPICILLIN <=2 SENSITIVE Sensitive     CEFAZOLIN <=4 SENSITIVE Sensitive     CEFTRIAXONE <=1 SENSITIVE Sensitive     CIPROFLOXACIN >=4 RESISTANT Resistant     GENTAMICIN <=1 SENSITIVE Sensitive     LEVOFLOXACIN >=8 RESISTANT Resistant     NITROFURANTOIN 128 RESISTANT Resistant     TOBRAMYCIN <=1 SENSITIVE Sensitive    TRIMETH/SULFA <=20 SENSITIVE Sensitive     PIP/TAZO <=4 SENSITIVE Sensitive     * PROTEUS MIRABILIS  MRSA PCR Screening     Status: None   Collection Time: 05/24/14 10:01 AM  Result Value Ref Range Status   MRSA by PCR NEGATIVE NEGATIVE Final    Comment:        The GeneXpert MRSA Assay (FDA approved for NASAL specimens only), is one component of a comprehensive MRSA colonization surveillance program. It is not intended to diagnose MRSA infection nor to guide or monitor treatment for MRSA infections.     Medical History: Past Medical History  Diagnosis Date  . Dementia 01/25/2011  . CLL 09/22/2009  . Personal history of malignant neoplasm of breast 09/22/2009  . ANEMIA-IRON DEFICIENCY 04/09/2007  . Bronchiectasis without acute exacerbation 01/07/2009  . GERD 02/26/2008  . HYPERLIPIDEMIA 04/09/2007  . OBSTRUCTIVE SLEEP APNEA 04/22/2008  . Pancytopenia 03/19/2009  . Recurrent UTI 01/25/2011  . DIVERTICULOSIS, COLON 04/15/2010  . GLAUCOMA 04/09/2007  . 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 OSTEOPOROSIS 03/19/2009  . RECTOCELE WITHOUT MENTION OF UTERINE PROLAPSE 04/15/2010  . Depression 05/03/2012  . Stage I breast cancer 06/29/2012    Left breast stage 1 dx 08/1996;  ER positive Rx lumpectomy/RT/Tamoxifen    Medications:  Anti-infectives    Start     Dose/Rate Route Frequency Ordered Stop   06/09/14 0200  levofloxacin (LEVAQUIN)  IVPB 750 mg     750 mg100 mL/hr over 90 Minutes Intravenous Daily at bedtime 06/09/14 0145       Assessment: Patient with Acute respiratory failure with hypoxia.  Patient with reduced renal function, but very near breakpoint for q24hr levofloxacin dosing  Goal of Therapy:  Levofloxacin dosed based on patient weight and renal function   Plan:  Follow up culture results  Levofloxacin 750mg  iv q24hr     Tyler Deis, Shea Stakes Crowford 06/09/2014,5:38 AM

## 2014-06-09 NOTE — H&P (Signed)
PCP:   [name], MD   Chief Complaint:  hypoxia  HPI: 81 yo female h/o likely recurrence of her lymphoma, h/o CLL, dementia, dvt recently coumadin stopped due to lgib, recent lgib early dec 2014, chronic hypoxia on 2 liters [place] at snf, breast cancer sent in from SNF for noted oxygen sats in the mid 70s.  Unknown if her oxygen was actually on or not, however, in the ED her oxygen sats are normal on 3 liters of [place].  No report of fever.  No bleeding.  Her hgb is same as it was last month around 7.5.  Her wbc is higher than usual.  Not being treated for her cancer issues, not felt to be good candidate for chemotherapy.  Palliative care saw pt recently and was noted family did not wish any further hospitalizations if possible and no aggressive interventions in the future.  DNR.  Consideration for hospice but this was not discussed actually i believe.  We are asked to obs pt overnight for her mild elevation in her troponin.  Pt cannot provide useful information due to her advanced dementia.  Family has left.  Review of Systems:  Unobtainable  Past Medical History: Past Medical History  Diagnosis Date  . Dementia 01/25/2011  . CLL 09/22/2009  . Personal history of malignant neoplasm of breast 09/22/2009  . ANEMIA-IRON DEFICIENCY 04/09/2007  . Bronchiectasis without acute exacerbation 01/07/2009  . GERD 02/26/2008  . HYPERLIPIDEMIA 04/09/2007  . OBSTRUCTIVE SLEEP APNEA 04/22/2008  . Pancytopenia 03/19/2009  . Recurrent UTI 01/25/2011  . DIVERTICULOSIS, COLON 04/15/2010  . GLAUCOMA 04/09/2007  . 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 OSTEOPOROSIS 03/19/2009  . RECTOCELE WITHOUT MENTION OF UTERINE PROLAPSE 04/15/2010  . Depression 05/03/2012  . Stage I breast cancer 06/29/2012    Left breast stage 1 dx 08/1996;  ER positive Rx lumpectomy/RT/Tamoxifen   Past Surgical History  Procedure Laterality Date  . Breast surgery      lumpectomy  . Abdominal hysterectomy    . Rotater cuff       Medications: Prior to Admission medications   Medication Sig Start Date End Date Taking? Authorizing Provider  acetaminophen (TYLENOL) 325 MG tablet Take 2 tablets (650 mg total) by mouth 3 (three) times daily. Patient taking differently: Take 325 mg by mouth 3 (three) times daily.  04/22/14  Yes Christina P Rama, MD  Amino Acids-Protein Hydrolys (FEEDING SUPPLEMENT, PRO-STAT SUGAR FREE 64,) LIQD Take 30 mLs by mouth 2 (two) times daily.   Yes Historical Provider, MD  AZOPT 1 % ophthalmic suspension Place 1 drop into the left eye Twice daily.  05/10/12  Yes Historical Provider, MD  Calcium Carbonate-Vitamin D (CALCIUM 600+D) 600-400 MG-UNIT per tablet Take 1 tablet by mouth daily.   Yes Historical Provider, MD  citalopram (CELEXA) 10 MG tablet Take 10 mg by mouth daily.   Yes Historical Provider, MD  COMBIGAN 0.2-0.5 % ophthalmic solution Place 1 drop into both eyes 2 (two) times daily.  06/06/13  Yes Historical Provider, MD  donepezil (ARICEPT) 5 MG tablet Take 1 tablet (5 mg total) by mouth at bedtime. 10/11/13  Yes Biagio Borg, MD  dorzolamide-timolol (COSOPT) 22.3-6.8 MG/ML ophthalmic solution Place 1 drop into both eyes 2 (two) times daily.   Yes Historical Provider, MD  feeding supplement, ENSURE COMPLETE, (ENSURE COMPLETE) LIQD Take 237 mLs by mouth 2 (two) times daily between meals. 04/22/14  Yes Christina P Rama, MD  furosemide (LASIX) 40 MG tablet Take 40 mg by  mouth.   Yes Historical Provider, MD  iron polysaccharides (NIFEREX) 150 MG capsule Take 150 mg by mouth 2 (two) times daily.   Yes Historical Provider, MD  latanoprost (XALATAN) 0.005 % ophthalmic solution Place into the left eye at bedtime. 12/24/12  Yes Historical Provider, MD  losartan (COZAAR) 50 MG tablet Take 50 mg by mouth daily.   Yes Historical Provider, MD  memantine (NAMENDA) 5 MG tablet Take 1 tablet (5 mg total) by mouth 2 (two) times daily. 10/11/13  Yes Biagio Borg, MD  metoCLOPramide (REGLAN) 5 MG tablet Take 5  mg by mouth 2 (two) times daily.   Yes Historical Provider, MD  Multiple Vitamin (MULTIVITAMIN WITH MINERALS) TABS tablet Take 1 tablet by mouth daily.   Yes Historical Provider, MD  pantoprazole (PROTONIX) 40 MG tablet Take 1 tablet (40 mg total) by mouth daily. 05/27/14  Yes Robbie Lis, MD  polyethylene glycol [facility] / Floria Raveling) packet Take 17 g by mouth daily. 04/22/14  Yes Venetia Maxon Rama, MD  simethicone (MYLICON) 40 FG/1.8EX drops Take 1.2 mLs (80 mg total) by mouth 3 (three) times daily. 04/22/14  Yes Venetia Maxon Rama, MD  amoxicillin (AMOXIL) 500 MG capsule Take 1 capsule (500 mg total) by mouth 3 (three) times daily. Patient not taking: Reported on 06/08/2014 05/27/14   Robbie Lis, MD  benzonatate (TESSALON) 200 MG capsule Take 1 capsule (200 mg total) by mouth 2 (two) times daily. Patient taking differently: Take 200 mg by mouth 2 (two) times daily as needed for cough.  04/22/14   Venetia Maxon Rama, MD  bisacodyl (DULCOLAX) 10 MG suppository Place 1 suppository (10 mg total) rectally daily as needed for moderate constipation. 04/22/14   Christina P Rama, MD  ipratropium-albuterol (DUONEB) 0.5-2.5 (3) MG/3ML SOLN Take 3 mLs by nebulization every 4 (four) hours as needed. 04/22/14   Christina P Rama, MD  ondansetron (ZOFRAN) 4 MG tablet Take 1 tablet (4 mg total) by mouth every 6 (six) hours as needed for nausea. 05/27/14   Robbie Lis, MD  opium-belladonna (B&O SUPPRETTES) 16.2-60 MG suppository Place 1 suppository rectally every 6 (six) hours as needed for bladder spasms. 04/22/14   Venetia Maxon Rama, MD    Allergies:   Allergies  Allergen Reactions  . Pseudoephedrine Other (See Comments)    insomnia  . Codeine     REACTION: nausea (mild)  . Doxycycline     REACTION: Nausea  . Nsaids     REACTION: GI bleed/anemia  . Solifenacin Succinate     REACTION: blurred vision    Social History:  reports that she has never smoked. She has never used smokeless tobacco. She  reports that she does not drink alcohol or use illicit drugs.  Family History: Family History  Problem Relation Age of Onset  . Hypertension Father   . Cancer Sister   . Cancer Brother     Physical Exam: Filed Vitals:   06/08/14 2126  BP: 158/60  Pulse: 81  Temp: 97.9 F (36.6 C)  Resp: 28  SpO2: 100%   General appearance: delirious, no distress and slowed mentation Head: Normocephalic, without obvious abnormality, atraumatic Eyes: negative Nose: Nares normal. Septum midline. Mucosa normal. No drainage or sinus tenderness. Neck: no JVD and supple, symmetrical, trachea midline Lungs: diminished breath sounds bilaterally Heart: regular rate and rhythm, S1, S2 normal, no murmur, click, rub or gallop Abdomen: soft, non-tender; bowel sounds normal; no masses,  no organomegaly Extremities: extremities normal, atraumatic, no  cyanosis or edema Pulses: 2+ and symmetric Skin: Skin color, texture, turgor normal. No rashes or lesions Neurologic: Cranial nerves: normal    Labs on Admission:   Recent Labs  06/08/14 2208  NA 139  K 3.6  CL 91*  CO2 46*  GLUCOSE 137*  BUN 24*  CREATININE 0.55  CALCIUM 8.5    Recent Labs  06/08/14 2208  AST 27  ALT 19  ALKPHOS 55  BILITOT 0.7  PROT 6.0  ALBUMIN 3.7    Recent Labs  06/08/14 2208  WBC 43.8*  NEUTROABS 3.9  HGB 7.5*  HCT 26.9*  MCV 100.7*  PLT 97*    Recent Labs  06/08/14 2208  TROPONINI 0.04*   Radiological Exams on Admission: Dg Chest Port 1 View  06/08/2014   CLINICAL DATA:  Acute onset of shortness of breath. Initial encounter.  EXAM: PORTABLE CHEST - 1 VIEW  COMPARISON:  Chest radiograph performed 05/24/2014  FINDINGS: The lungs are well-aerated. Small bilateral pleural effusions noted, left greater than right, with bibasilar opacities. This could reflect minimal interstitial edema. Pulmonary vascularity is at the upper limits of normal. There is no evidence of pneumothorax.  The cardiomediastinal  silhouette is mildly enlarged. No acute osseous abnormalities are seen.  IMPRESSION: Small bilateral pleural effusions, left greater than right, with bibasilar airspace opacities. This could reflect minimal interstitial edema. Mild cardiomegaly noted.   Electronically Signed   By: Garald Balding M.D.   On: 06/08/2014 22:19    Assessment/Plan  81 yo female with transient hypoxia at SNF now resolved with overall FTT  Principal Problem:   Acute respiratory failure with hypoxia-  nml here on her nml oxygen.  Will cover with levaquin although at this point in her medical history do not think it would benefit much.  Would also consider possible PE but again, would be unable to treat with coumadin in light of her recent gib and is not a good ivc filter candidate.  Really need hospice, discuss with family in am.  It is well documented that pt and family did not wish any aggressive interventions or further hospitalizations.....  Active Problems:   CLL (chronic lymphocytic leukemia)   Stage I breast cancer   DNR (do not resuscitate)   Bleeding on Coumadin   History of DVT (deep vein thrombosis)   Dyspnea   Mediastinal adenopathy  Overall very poor prognosis.  DNR.  obs on tele bed.  Transition to comfort care/hospice approach at SNF if ok with family (will need discuss in am).  Ahlaya Ende A 06/09/2014, 12:19 AM

## 2014-06-09 NOTE — Discharge Summary (Signed)
Physician Discharge Summary  Audrey Spencer XFG:[id number] DOB: 19-Sep-1922 DOA: 06/08/2014  PCP: [name], MD  Admit date: 06/08/2014 Discharge date: 06/09/2014  Time spent: 45 minutes   Recommendations for Outpatient Follow-up:  Palliative care to follow the patient at nursing facility Multiple medications discontinued this admission that are not needed for a purely palliative care/comfort approach Prescribed Ativan 1 mg every 4 when necessary anxiety Will need nasal cannula oxygen 2-3 L at rest  DO NOT RESHOPITALIZE/DNR/DNI  Discharge Diagnoses:  Principal Problem:   Acute respiratory failure with hypoxia Active Problems:   CLL (chronic lymphocytic leukemia)   Stage I breast cancer   DNR (do not resuscitate)   Bleeding on Coumadin   History of DVT (deep vein thrombosis)   Dyspnea   Mediastinal adenopathy   Hypoxia   Discharge Condition: Guarded  Diet recommendation: Comfort feeds  Filed Weights   06/09/14 0112  Weight: 71.5 kg (157 lb 10.1 oz)    History of present illness:   81 y/o dementia, DVT on medical condition with Coumadin, and HDL diagnosed 11/1991, malignant transformation CLL 2010 status post treatment-recent admission 12/26 through 12/29 with acute GI bleed admitted once again 1/11 with decompensated acute resp failure with sats 70% and decompensated overnight at [facility].  She was sent here due to non-improvement and toxic metabolic encephalopathy from the facility.  HEr granddaughter describes that they are approaching comfort care decisions but seemed surpirised that she might be [facility] eligible.  I had a detailed discussion with her granddaughter who is healthcare power of attorney Mrs. Bostick and I discussed with the patient directly on day of discharge Patient wishes DO NOT RESUSCITATE and do not rehospitalize DO NOT INTUBATE Patient wishes palliative care/comfort approach Defer to nursing home physician Patient stable for transport back to  [facility]      Discharge Exam: Filed Vitals:   06/09/14 0514  BP: 129/51  Pulse: 83  Temp: 98.3 F (36.8 C)  Resp: 20    General: Alert oriented a little dyspneic but otherwise coherent Cardiovascular: S1-S2 no murmur rub or gallop Respiratory: Rales bilaterally  Discharge Instructions   Discharge Instructions    Diet - low sodium heart healthy    Complete by:  As directed      Increase activity slowly    Complete by:  As directed           Current Discharge Medication List    START taking these medications   Details  LORazepam (ATIVAN) 2 MG/ML concentrated solution Take 0.5 mLs (1 mg total) by mouth every 4 (four) hours as needed for anxiety. Qty: 30 mL, Refills: 0      CONTINUE these medications which have NOT CHANGED   Details  acetaminophen (TYLENOL) 325 MG tablet Take 2 tablets (650 mg total) by mouth 3 (three) times daily.    AZOPT 1 % ophthalmic suspension Place 1 drop into the left eye Twice daily.     Calcium Carbonate-Vitamin D (CALCIUM 600+D) 600-400 MG-UNIT per tablet Take 1 tablet by mouth daily.    citalopram (CELEXA) 10 MG tablet Take 10 mg by mouth daily.    COMBIGAN 0.2-0.5 % ophthalmic solution Place 1 drop into both eyes 2 (two) times daily.     dorzolamide-timolol (COSOPT) 22.3-6.8 MG/ML ophthalmic solution Place 1 drop into both eyes 2 (two) times daily.    furosemide (LASIX) 40 MG tablet Take 40 mg by mouth.    latanoprost (XALATAN) 0.005 % ophthalmic solution Place into  the left eye at bedtime.   Associated Diagnoses: Lymphoma malignant, nodular, lymphocytic; Stage I breast cancer, left; CLL (chronic lymphocytic leukemia)    polyethylene glycol (MIRALAX / GLYCOLAX) packet Take 17 g by mouth daily. Qty: 14 each, Refills: 0    simethicone (MYLICON) 40 ME/2.6ST drops Take 1.2 mLs (80 mg total) by mouth 3 (three) times daily. Qty: 30 mL, Refills: 0    benzonatate (TESSALON) 200 MG capsule Take 1 capsule (200 mg total) by  mouth 2 (two) times daily. Qty: 20 capsule, Refills: 0    bisacodyl (DULCOLAX) 10 MG suppository Place 1 suppository (10 mg total) rectally daily as needed for moderate constipation. Qty: 12 suppository, Refills: 0    ipratropium-albuterol (DUONEB) 0.5-2.5 (3) MG/3ML SOLN Take 3 mLs by nebulization every 4 (four) hours as needed. Qty: 360 mL    ondansetron (ZOFRAN) 4 MG tablet Take 1 tablet (4 mg total) by mouth every 6 (six) hours as needed for nausea. Qty: 20 tablet, Refills: 0    opium-belladonna (B&O SUPPRETTES) 16.2-60 MG suppository Place 1 suppository rectally every 6 (six) hours as needed for bladder spasms. Qty: 12 suppository, Refills: 0      STOP taking these medications     Amino Acids-Protein Hydrolys (FEEDING SUPPLEMENT, PRO-STAT SUGAR FREE 64,) LIQD      donepezil (ARICEPT) 5 MG tablet      feeding supplement, ENSURE COMPLETE, (ENSURE COMPLETE) LIQD      iron polysaccharides (NIFEREX) 150 MG capsule      losartan (COZAAR) 50 MG tablet      memantine (NAMENDA) 5 MG tablet      metoCLOPramide (REGLAN) 5 MG tablet      Multiple Vitamin (MULTIVITAMIN WITH MINERALS) TABS tablet      pantoprazole (PROTONIX) 40 MG tablet      amoxicillin (AMOXIL) 500 MG capsule        Allergies  Allergen Reactions  . Pseudoephedrine Other (See Comments)    insomnia  . Codeine     REACTION: nausea (mild)  . Doxycycline     REACTION: Nausea  . Nsaids     REACTION: GI bleed/anemia  . Solifenacin Succinate     REACTION: blurred vision      The results of significant diagnostics from this hospitalization (including imaging, microbiology, ancillary and laboratory) are listed below for reference.    Significant Diagnostic Studies: Dg Chest 2 View  05/24/2014   CLINICAL DATA:  Rectal bleeding.  EXAM: CHEST  2 VIEW  COMPARISON:  04/21/2014 and 07/31/2013  FINDINGS: Lungs are adequately inflated without focal consolidation or effusion. There is minimal blunting of the left  costophrenic angle which may be slightly worse as cannot exclude a small amount left pleural fluid there is mild stable cardiomegaly. There is calcified plaque over the aortic arch. Two previous kyphoplasties over the lower thoracic and upper lumbar spine unchanged.  IMPRESSION: Subtle blunting of the left costophrenic angle slightly worse and may be due to a small amount of pleural fluid.  Stable cardiomegaly.   Electronically Signed   By: Marin Olp M.D.   On: 05/24/2014 07:25   Ct Abdomen Pelvis W Contrast  05/24/2014   CLINICAL DATA:  Pale complexion.  Rectal bleeding.  EXAM: CT ABDOMEN AND PELVIS WITH CONTRAST  TECHNIQUE: Multidetector CT imaging of the abdomen and pelvis was performed using the standard protocol following bolus administration of intravenous contrast.  CONTRAST:  71mL OMNIPAQUE IOHEXOL 300 MG/ML SOLN, 11mL OMNIPAQUE IOHEXOL 300 MG/ML SOLN  COMPARISON:  04/16/2014  FINDINGS: Small right pleural effusion. Dependent atelectasis at both lung bases.  Stable scattered liver cysts.  Left renal cysts is stable.  Stable renal hypodensities.  Spleen is within normal limits. Pancreas is within normal limits. Unremarkable gallbladder.  The cecum is in the right upper quadrant anterior to the liver. Terminal ileum and appendix are unremarkable.  Extensive sigmoid diverticulosis. Prominent stool and gas burden in the rectum.  Abnormal mesenteric and retroperitoneal adenopathy has improved. Index left para-aortic node on image 39 is 10 mm and was previously 12 mm by my direct measurements. Inguinal lymph nodes are also smaller.  Bladder is decompressed. Uterus is absent. Adnexa are unremarkable.  There is gas in the subcutaneous fat of the left flank which may be related to an injection.  Small hiatal hernia.  Stable lumbar spine.  IMPRESSION: Abnormal retroperitoneal and mesenteric adenopathy has improved supporting benign etiology.  Right pleural effusion and basilar atelectasis.  Chronic changes.    Electronically Signed   By: Maryclare Bean M.D.   On: 05/24/2014 08:55   Dg Chest Port 1 View  06/08/2014   CLINICAL DATA:  Acute onset of shortness of breath. Initial encounter.  EXAM: PORTABLE CHEST - 1 VIEW  COMPARISON:  Chest radiograph performed 05/24/2014  FINDINGS: The lungs are well-aerated. Small bilateral pleural effusions noted, left greater than right, with bibasilar opacities. This could reflect minimal interstitial edema. Pulmonary vascularity is at the upper limits of normal. There is no evidence of pneumothorax.  The cardiomediastinal silhouette is mildly enlarged. No acute osseous abnormalities are seen.  IMPRESSION: Small bilateral pleural effusions, left greater than right, with bibasilar airspace opacities. This could reflect minimal interstitial edema. Mild cardiomegaly noted.   Electronically Signed   By: Garald Balding M.D.   On: 06/08/2014 22:19    Microbiology: No results found for this or any previous visit (from the past 240 hour(s)).   Labs: Basic Metabolic Panel:  Recent Labs Lab 06/08/14 2208  NA 139  K 3.6  CL 91*  CO2 46*  GLUCOSE 137*  BUN 24*  CREATININE 0.55  CALCIUM 8.5   Liver Function Tests:  Recent Labs Lab 06/08/14 2208  AST 27  ALT 19  ALKPHOS 55  BILITOT 0.7  PROT 6.0  ALBUMIN 3.7   No results for input(s): LIPASE, AMYLASE in the last 168 hours. No results for input(s): AMMONIA in the last 168 hours. CBC:  Recent Labs Lab 06/08/14 2208  WBC 43.8*  NEUTROABS 3.9  HGB 7.5*  HCT 26.9*  MCV 100.7*  PLT 97*   Cardiac Enzymes:  Recent Labs Lab 06/08/14 2208 06/09/14 0135 06/09/14 0700 06/09/14 1305  TROPONINI 0.04* 0.04* 0.04* 0.03   BNP: BNP (last 3 results) No results for input(s): PROBNP in the last 8760 hours. CBG: No results for input(s): GLUCAP in the last 168 hours.     SignedNita Sells  [facility] 06/09/2014, 3:10 PM

## 2014-06-09 NOTE — Progress Notes (Signed)
Checked on pt. Pt O2 sats were between 84-88 on Venti mask 14 L 55%. Switch pt to a non-re breather mask, 02 Sat is sustaining 98-100%.Pt will open eyes when her name is called.  Will continue to monitor pt.

## 2014-06-09 NOTE — Progress Notes (Signed)
Patient is set to discharge to back to [facility] today. Patient & family at bedside aware. Discharge packet given to RN, Katharine Look. [facility] called for transport.     Raynaldo Opitz, [facility] Clinical Social Worker cell #: [phone]

## 2014-06-09 NOTE — Progress Notes (Signed)
UR completed 

## 2014-06-30 DEATH — deceased

## 2014-08-29 ENCOUNTER — Ambulatory Visit: Payer: Medicare Other | Admitting: Hematology and Oncology

## 2014-08-29 ENCOUNTER — Other Ambulatory Visit: Payer: Medicare Other
